# Patient Record
Sex: Male | Born: 1950 | Race: White | Hispanic: No | Marital: Married | Smoking: Never smoker
Health system: Southern US, Community
[De-identification: ages and names within clinical notes are randomized; demographics above are authoritative.]

## PROBLEM LIST (undated history)

## (undated) DIAGNOSIS — K635 Polyp of colon: Secondary | ICD-10-CM

## (undated) DIAGNOSIS — K219 Gastro-esophageal reflux disease without esophagitis: Secondary | ICD-10-CM

## (undated) DIAGNOSIS — I4891 Unspecified atrial fibrillation: Secondary | ICD-10-CM

## (undated) DIAGNOSIS — R42 Dizziness and giddiness: Secondary | ICD-10-CM

## (undated) DIAGNOSIS — I10 Essential (primary) hypertension: Secondary | ICD-10-CM

## (undated) DIAGNOSIS — F329 Major depressive disorder, single episode, unspecified: Secondary | ICD-10-CM

## (undated) DIAGNOSIS — M109 Gout, unspecified: Secondary | ICD-10-CM

## (undated) DIAGNOSIS — F1020 Alcohol dependence, uncomplicated: Secondary | ICD-10-CM

## (undated) DIAGNOSIS — K922 Gastrointestinal hemorrhage, unspecified: Secondary | ICD-10-CM

## (undated) DIAGNOSIS — F32A Depression, unspecified: Secondary | ICD-10-CM

## (undated) DIAGNOSIS — F1010 Alcohol abuse, uncomplicated: Secondary | ICD-10-CM

## (undated) DIAGNOSIS — F419 Anxiety disorder, unspecified: Secondary | ICD-10-CM

## (undated) HISTORY — DX: Gastro-esophageal reflux disease without esophagitis: K21.9

## (undated) HISTORY — PX: TONSILLECTOMY: SUR1361

## (undated) HISTORY — PX: ADENOIDECTOMY: SUR15

## (undated) HISTORY — PX: HERNIA REPAIR: SHX51

---

## 2013-01-19 ENCOUNTER — Encounter (HOSPITAL_COMMUNITY): Payer: Self-pay

## 2013-01-19 ENCOUNTER — Emergency Department (HOSPITAL_COMMUNITY): Payer: Self-pay

## 2013-01-19 ENCOUNTER — Emergency Department (HOSPITAL_COMMUNITY)
Admission: EM | Admit: 2013-01-19 | Discharge: 2013-01-20 | Disposition: A | Discharge status: Home / Self Care | Payer: Self-pay | Attending: Emergency Medicine | Admitting: Emergency Medicine

## 2013-01-19 DIAGNOSIS — F10929 Alcohol use, unspecified with intoxication, unspecified: Secondary | ICD-10-CM

## 2013-01-19 DIAGNOSIS — Z79899 Other long term (current) drug therapy: Secondary | ICD-10-CM | POA: Insufficient documentation

## 2013-01-19 DIAGNOSIS — Y9389 Activity, other specified: Secondary | ICD-10-CM | POA: Insufficient documentation

## 2013-01-19 DIAGNOSIS — I10 Essential (primary) hypertension: Secondary | ICD-10-CM | POA: Insufficient documentation

## 2013-01-19 DIAGNOSIS — J3489 Other specified disorders of nose and nasal sinuses: Secondary | ICD-10-CM | POA: Insufficient documentation

## 2013-01-19 DIAGNOSIS — M109 Gout, unspecified: Secondary | ICD-10-CM | POA: Insufficient documentation

## 2013-01-19 DIAGNOSIS — F1010 Alcohol abuse, uncomplicated: Secondary | ICD-10-CM | POA: Insufficient documentation

## 2013-01-19 DIAGNOSIS — R42 Dizziness and giddiness: Secondary | ICD-10-CM | POA: Insufficient documentation

## 2013-01-19 DIAGNOSIS — Z043 Encounter for examination and observation following other accident: Secondary | ICD-10-CM | POA: Insufficient documentation

## 2013-01-19 DIAGNOSIS — Y92410 Unspecified street and highway as the place of occurrence of the external cause: Secondary | ICD-10-CM | POA: Insufficient documentation

## 2013-01-19 HISTORY — DX: Gout, unspecified: M10.9

## 2013-01-19 HISTORY — DX: Essential (primary) hypertension: I10

## 2013-01-19 LAB — BASIC METABOLIC PANEL
CO2: 23 mEq/L (ref 19–32)
Chloride: 101 mEq/L (ref 96–112)
GFR calc Af Amer: >90 mL/min (ref >90)
Potassium: 3.5 mEq/L (ref 3.5–5.1)
Sodium: 137 mEq/L (ref 135–145)

## 2013-01-19 LAB — CBC WITH DIFFERENTIAL/PLATELET
Basophils Absolute: 0 10*3/uL (ref 0.0–0.1)
Basophils Relative: 0 % (ref 0–1)
HCT: 36.6 % — ABNORMAL LOW (ref 39.0–52.0)
Hemoglobin: 12.6 g/dL — ABNORMAL LOW (ref 13.0–17.0)
Lymphocytes Relative: 39 % (ref 12–46)
MCHC: 34.4 g/dL (ref 30.0–36.0)
Neutro Abs: 2.3 10*3/uL (ref 1.7–7.7)
Neutrophils Relative %: 48 % (ref 43–77)
RDW: 14.6 % (ref 11.5–15.5)
WBC: 5 10*3/uL (ref 4.0–10.5)

## 2013-01-19 LAB — ETHANOL: Alcohol, Ethyl (B): 101 mg/dL — ABNORMAL HIGH (ref 0–11)

## 2013-01-19 MED ORDER — IOHEXOL 300 MG/ML  SOLN
100.0000 mL | Freq: Once | INTRAMUSCULAR | Status: AC | PRN
  Administered 2013-01-19: 100 mL via INTRAVENOUS

## 2013-01-19 MED ORDER — ONDANSETRON HCL 4 MG/2ML IJ SOLN
4.0000 mg | Freq: Once | INTRAMUSCULAR | Status: AC
  Administered 2013-01-19: 4 mg via INTRAVENOUS
  Filled 2013-01-19: qty 2

## 2013-01-19 MED ORDER — SODIUM CHLORIDE 0.9 % IV SOLN
INTRAVENOUS | Status: DC
  Administered 2013-01-19: 23:00:00 via INTRAVENOUS

## 2013-01-19 MED ORDER — SODIUM CHLORIDE 0.9 % IV BOLUS (SEPSIS)
250.0000 mL | Freq: Once | INTRAVENOUS | Status: AC
  Administered 2013-01-19: 250 mL via INTRAVENOUS
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
## 2013-01-19 NOTE — ED Notes (Signed)
Remains alert, denies pain anywhere.
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
## 2013-02-01 ENCOUNTER — Emergency Department (HOSPITAL_COMMUNITY)
Admission: EM | Admit: 2013-02-01 | Discharge: 2013-02-01 | Discharge status: Against Medical Advice | Payer: No Typology Code available for payment source | Attending: Emergency Medicine | Admitting: Emergency Medicine

## 2013-02-01 DIAGNOSIS — Z5320 Procedure and treatment not carried out because of patient's decision for unspecified reasons: Secondary | ICD-10-CM | POA: Insufficient documentation

## 2013-02-01 NOTE — ED Notes (Signed)
Pt undecided about being seen in Er.  He will wait in lobby and decide.

## 2013-02-02 ENCOUNTER — Encounter (HOSPITAL_COMMUNITY): Payer: Self-pay | Admitting: *Deleted

## 2013-02-02 ENCOUNTER — Emergency Department (HOSPITAL_COMMUNITY)
Admission: EM | Admit: 2013-02-02 | Discharge: 2013-02-02 | Disposition: A | Discharge status: Home / Self Care | Payer: No Typology Code available for payment source | Attending: Emergency Medicine | Admitting: Emergency Medicine

## 2013-02-02 DIAGNOSIS — Y9389 Activity, other specified: Secondary | ICD-10-CM | POA: Insufficient documentation

## 2013-02-02 DIAGNOSIS — I10 Essential (primary) hypertension: Secondary | ICD-10-CM | POA: Insufficient documentation

## 2013-02-02 DIAGNOSIS — M109 Gout, unspecified: Secondary | ICD-10-CM | POA: Insufficient documentation

## 2013-02-02 DIAGNOSIS — S298XXA Other specified injuries of thorax, initial encounter: Secondary | ICD-10-CM | POA: Insufficient documentation

## 2013-02-02 DIAGNOSIS — R0789 Other chest pain: Secondary | ICD-10-CM

## 2013-02-02 DIAGNOSIS — Y92410 Unspecified street and highway as the place of occurrence of the external cause: Secondary | ICD-10-CM | POA: Insufficient documentation

## 2013-02-02 DIAGNOSIS — Z79899 Other long term (current) drug therapy: Secondary | ICD-10-CM | POA: Insufficient documentation

## 2013-02-02 MED ORDER — IBUPROFEN 600 MG PO TABS: 600.0000 mg | ORAL_TABLET | Freq: Four times a day (QID) | ORAL | Status: DC | PRN

## 2013-02-02 MED ORDER — METHOCARBAMOL 500 MG PO TABS: 500.0000 mg | ORAL_TABLET | Freq: Two times a day (BID) | ORAL | Status: DC

## 2013-02-02 MED ORDER — KETOROLAC TROMETHAMINE 60 MG/2ML IM SOLN
60.0000 mg | Freq: Once | INTRAMUSCULAR | Status: AC
  Administered 2013-02-02: 60 mg via INTRAMUSCULAR
  Filled 2013-02-02: qty 2
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
## 2013-02-05 ENCOUNTER — Encounter (HOSPITAL_COMMUNITY): Payer: Self-pay | Admitting: Emergency Medicine

## 2013-02-05 ENCOUNTER — Emergency Department (HOSPITAL_COMMUNITY)
Admission: EM | Admit: 2013-02-05 | Discharge: 2013-02-05 | Disposition: A | Discharge status: Home / Self Care | Payer: Self-pay | Attending: Emergency Medicine | Admitting: Emergency Medicine

## 2013-02-05 DIAGNOSIS — F419 Anxiety disorder, unspecified: Secondary | ICD-10-CM

## 2013-02-05 DIAGNOSIS — M109 Gout, unspecified: Secondary | ICD-10-CM | POA: Insufficient documentation

## 2013-02-05 DIAGNOSIS — F341 Dysthymic disorder: Secondary | ICD-10-CM | POA: Insufficient documentation

## 2013-02-05 DIAGNOSIS — Z79899 Other long term (current) drug therapy: Secondary | ICD-10-CM | POA: Insufficient documentation

## 2013-02-05 DIAGNOSIS — I10 Essential (primary) hypertension: Secondary | ICD-10-CM | POA: Insufficient documentation

## 2013-02-05 HISTORY — DX: Dizziness and giddiness: R42
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
## 2013-02-06 ENCOUNTER — Emergency Department (HOSPITAL_COMMUNITY)
Admission: EM | Admit: 2013-02-06 | Discharge: 2013-02-06 | Disposition: A | Discharge status: Home / Self Care | Payer: Self-pay | Attending: Emergency Medicine | Admitting: Emergency Medicine

## 2013-02-06 ENCOUNTER — Encounter (HOSPITAL_COMMUNITY): Payer: Self-pay | Admitting: Emergency Medicine

## 2013-02-06 DIAGNOSIS — M109 Gout, unspecified: Secondary | ICD-10-CM | POA: Insufficient documentation

## 2013-02-06 DIAGNOSIS — F419 Anxiety disorder, unspecified: Secondary | ICD-10-CM

## 2013-02-06 DIAGNOSIS — Z79899 Other long term (current) drug therapy: Secondary | ICD-10-CM | POA: Insufficient documentation

## 2013-02-06 DIAGNOSIS — F411 Generalized anxiety disorder: Secondary | ICD-10-CM | POA: Insufficient documentation

## 2013-02-06 DIAGNOSIS — I10 Essential (primary) hypertension: Secondary | ICD-10-CM | POA: Insufficient documentation

## 2013-02-06 DIAGNOSIS — F329 Major depressive disorder, single episode, unspecified: Secondary | ICD-10-CM | POA: Insufficient documentation

## 2013-02-06 DIAGNOSIS — F3289 Other specified depressive episodes: Secondary | ICD-10-CM | POA: Insufficient documentation

## 2013-02-06 HISTORY — DX: Depression, unspecified: F32.A

## 2013-02-06 HISTORY — DX: Major depressive disorder, single episode, unspecified: F32.9

## 2013-02-06 HISTORY — DX: Anxiety disorder, unspecified: F41.9

## 2013-02-06 NOTE — ED Notes (Signed)
Pt is wanting to know how long before he talks with DR. Misty Stanley

## 2013-02-06 NOTE — ED Notes (Signed)
Pt requesting to leave the er, states "I don;t have time to wait", Dr. Adriana Simas informed, advised pt is able to leave, sign pt out ama, attempts made to talk to pt by several different staff, pt still requesting to leave.
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
## 2013-02-06 NOTE — ED Notes (Signed)
Security paged to triage, patient wanded per protocol.

## 2013-02-06 NOTE — ED Notes (Signed)
Pt presents to er with request to have appointment made with psych. Pt states that he needs help, has been having mental problems all his life, admits to hearing voices at times, denies any SI/HI, Dr. Adriana Simas in prior to RN, see EDP assessment for further. Pt anxious about staying in er, states "I don't have time to stay in er, I don't see why I can't make an appointment for the er" explained to pt that appointments are not made in the er, comfort measure provided.

## 2013-02-06 NOTE — ED Notes (Signed)
States that he is having "mental issues."  States that he is here for psychotherapy.  States that he is hearing voices of his dead relatives.  Denies suicidal thoughts, denies homicidal thoughts.  States that he just feels alone.

## 2013-02-20 ENCOUNTER — Emergency Department (HOSPITAL_COMMUNITY)
Admission: EM | Admit: 2013-02-20 | Discharge: 2013-02-21 | Disposition: A | Discharge status: Home / Self Care | Payer: Self-pay | Attending: Emergency Medicine | Admitting: Emergency Medicine

## 2013-02-20 ENCOUNTER — Encounter (HOSPITAL_COMMUNITY): Payer: Self-pay

## 2013-02-20 ENCOUNTER — Emergency Department (HOSPITAL_COMMUNITY): Payer: Self-pay

## 2013-02-20 DIAGNOSIS — Z8639 Personal history of other endocrine, nutritional and metabolic disease: Secondary | ICD-10-CM | POA: Insufficient documentation

## 2013-02-20 DIAGNOSIS — F3289 Other specified depressive episodes: Secondary | ICD-10-CM | POA: Insufficient documentation

## 2013-02-20 DIAGNOSIS — F10229 Alcohol dependence with intoxication, unspecified: Secondary | ICD-10-CM | POA: Insufficient documentation

## 2013-02-20 DIAGNOSIS — Z79899 Other long term (current) drug therapy: Secondary | ICD-10-CM | POA: Insufficient documentation

## 2013-02-20 DIAGNOSIS — F411 Generalized anxiety disorder: Secondary | ICD-10-CM | POA: Insufficient documentation

## 2013-02-20 DIAGNOSIS — F329 Major depressive disorder, single episode, unspecified: Secondary | ICD-10-CM | POA: Insufficient documentation

## 2013-02-20 DIAGNOSIS — F1020 Alcohol dependence, uncomplicated: Secondary | ICD-10-CM

## 2013-02-20 DIAGNOSIS — I10 Essential (primary) hypertension: Secondary | ICD-10-CM | POA: Insufficient documentation

## 2013-02-20 DIAGNOSIS — F10929 Alcohol use, unspecified with intoxication, unspecified: Secondary | ICD-10-CM

## 2013-02-20 DIAGNOSIS — Z862 Personal history of diseases of the blood and blood-forming organs and certain disorders involving the immune mechanism: Secondary | ICD-10-CM | POA: Insufficient documentation

## 2013-02-20 LAB — URINALYSIS, ROUTINE W REFLEX MICROSCOPIC
Bilirubin Urine: NEGATIVE
Protein, ur: NEGATIVE mg/dL
Specific Gravity, Urine: 1.025 (ref 1.005–1.030)
Urobilinogen, UA: 0.2 mg/dL (ref 0.0–1.0)

## 2013-02-20 LAB — BASIC METABOLIC PANEL
BUN: 11 mg/dL (ref 6–23)
CO2: 20 mEq/L (ref 19–32)
Chloride: 94 mEq/L — ABNORMAL LOW (ref 96–112)
Creatinine, Ser: 0.94 mg/dL (ref 0.50–1.35)
Glucose, Bld: 139 mg/dL — ABNORMAL HIGH (ref 70–99)
Potassium: 3.1 mEq/L — ABNORMAL LOW (ref 3.5–5.1)

## 2013-02-20 LAB — CBC WITH DIFFERENTIAL/PLATELET
HCT: 41.7 % (ref 39.0–52.0)
Hemoglobin: 14.7 g/dL (ref 13.0–17.0)
Lymphs Abs: 2.6 10*3/uL (ref 0.7–4.0)
MCHC: 35.3 g/dL (ref 30.0–36.0)
Monocytes Absolute: 0.5 10*3/uL (ref 0.1–1.0)
Monocytes Relative: 9 % (ref 3–12)
Neutro Abs: 2.7 10*3/uL (ref 1.7–7.7)
Neutrophils Relative %: 45 % (ref 43–77)
RBC: 4.7 MIL/uL (ref 4.22–5.81)

## 2013-02-20 LAB — RAPID URINE DRUG SCREEN, HOSP PERFORMED
Amphetamines: NOT DETECTED
Barbiturates: NOT DETECTED
Benzodiazepines: POSITIVE — AB
Cocaine: NOT DETECTED
Opiates: NOT DETECTED
Tetrahydrocannabinol: NOT DETECTED

## 2013-02-20 LAB — URINE MICROSCOPIC-ADD ON

## 2013-02-20 MED ORDER — ACETAMINOPHEN 325 MG PO TABS: 650.0000 mg | ORAL_TABLET | ORAL | Status: DC | PRN

## 2013-02-20 MED ORDER — ADULT MULTIVITAMIN W/MINERALS CH
1.0000 | ORAL_TABLET | Freq: Every day | ORAL | Status: DC
  Administered 2013-02-21: 1 via ORAL
  Filled 2013-02-20: qty 1

## 2013-02-20 MED ORDER — IBUPROFEN 400 MG PO TABS: 600.0000 mg | ORAL_TABLET | Freq: Three times a day (TID) | ORAL | Status: DC | PRN

## 2013-02-20 MED ORDER — FOLIC ACID 1 MG PO TABS
1.0000 mg | ORAL_TABLET | Freq: Every day | ORAL | Status: DC
  Administered 2013-02-21: 1 mg via ORAL
  Filled 2013-02-20: qty 1

## 2013-02-20 MED ORDER — ONDANSETRON HCL 4 MG PO TABS
4.0000 mg | ORAL_TABLET | Freq: Three times a day (TID) | ORAL | Status: DC | PRN
  Administered 2013-02-21: 4 mg via ORAL
  Filled 2013-02-20: qty 1

## 2013-02-20 MED ORDER — ALUM & MAG HYDROXIDE-SIMETH 200-200-20 MG/5ML PO SUSP: 30.0000 mL | ORAL | Status: DC | PRN

## 2013-02-20 MED ORDER — VITAMIN B-1 100 MG PO TABS
100.0000 mg | ORAL_TABLET | Freq: Every day | ORAL | Status: DC
  Administered 2013-02-21: 100 mg via ORAL
  Filled 2013-02-20: qty 1

## 2013-02-20 MED ORDER — LORAZEPAM 2 MG/ML IJ SOLN: 1.0000 mg | Freq: Four times a day (QID) | INTRAMUSCULAR | Status: DC | PRN

## 2013-02-20 MED ORDER — THIAMINE HCL 100 MG/ML IJ SOLN: 100.0000 mg | Freq: Every day | INTRAMUSCULAR | Status: DC

## 2013-02-20 MED ORDER — ZOLPIDEM TARTRATE 5 MG PO TABS: 5.0000 mg | ORAL_TABLET | Freq: Every evening | ORAL | Status: DC | PRN

## 2013-02-20 MED ORDER — LORAZEPAM 2 MG/ML IJ SOLN
2.0000 mg | Freq: Once | INTRAMUSCULAR | Status: AC
  Administered 2013-02-21: 2 mg via INTRAVENOUS
  Filled 2013-02-20: qty 1

## 2013-02-20 MED ORDER — NICOTINE 21 MG/24HR TD PT24: 21.0000 mg | MEDICATED_PATCH | Freq: Every day | TRANSDERMAL | Status: DC | PRN

## 2013-02-20 MED ORDER — LORAZEPAM 1 MG PO TABS
1.0000 mg | ORAL_TABLET | Freq: Four times a day (QID) | ORAL | Status: DC | PRN
  Administered 2013-02-21 (×3): 1 mg via ORAL
  Filled 2013-02-20 (×3): qty 1

## 2013-02-20 NOTE — ED Notes (Signed)
Joseph Art, NT and trained sitter going with patient to xray due to suicidal precautions.
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
## 2013-02-20 NOTE — ED Notes (Signed)
When this nurse asked patient about suicidal ideations, patient replied, "No, but now that you mention it, it's not a bad idea." Sitter has patient in sight. Patient undressed and placed on suicidal precautions.
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
## 2013-02-21 NOTE — ED Provider Notes (Signed)
PT STABLE VITALS IMPROVED NO SIGNS OF SIGNIFICANT WITHDRAWAL AT THIS TIME, WALKING AROUND THE ED IN NO DISTRESS UNABLE TO GET PLACED AT THIS TIME BUT IS STABLE FOR D/C PT AGREEABLE  Joya Gaskins, MD 02/21/13 1423

## 2013-02-21 NOTE — ED Notes (Signed)
Misty Stanley from registration diligently working to find [employer], no results as of yet.

## 2013-02-21 NOTE — ED Notes (Signed)
Spoke with Tammy Sours from ACT team, [facility] may have a bed depending on Pt's insurance, there are no sponsorship beds available.  Working with registration to find out more information on patients insurance.

## 2013-02-21 NOTE — ED Notes (Signed)
Discharge instructions reviewed with pt, questions answered. Pt verbalized understanding. Valuables returned by security, personal belongings returned and verified with pt.

## 2013-02-21 NOTE — ED Notes (Signed)
Pt resting in bed, even rise and fall of chest, in NAD. Sitter remains at bedside. Will continue to monitor.

## 2013-02-21 NOTE — ED Notes (Signed)
Pt resting in bed, even rise and fall of chest, in NAD. Sitter remains at bedside. Will continue to monitor. 

## 2013-02-21 NOTE — ED Notes (Signed)
Pt resting in bed, in no apparent distress.

## 2013-02-21 NOTE — BH Assessment (Signed)
[facility] Assessment Progress Note      02/21/13.  Pt referrals made to [facility] and [facility].  [facility] and [facility] both full. Daleen Squibb, LCSWA

## 2013-03-10 ENCOUNTER — Encounter (HOSPITAL_COMMUNITY): Payer: Self-pay | Admitting: Emergency Medicine

## 2013-03-10 ENCOUNTER — Emergency Department (HOSPITAL_COMMUNITY)
Admission: EM | Admit: 2013-03-10 | Discharge: 2013-03-10 | Disposition: A | Discharge status: Home / Self Care | Payer: Self-pay | Attending: Emergency Medicine | Admitting: Emergency Medicine

## 2013-03-10 DIAGNOSIS — F411 Generalized anxiety disorder: Secondary | ICD-10-CM | POA: Insufficient documentation

## 2013-03-10 DIAGNOSIS — F3289 Other specified depressive episodes: Secondary | ICD-10-CM | POA: Insufficient documentation

## 2013-03-10 DIAGNOSIS — M109 Gout, unspecified: Secondary | ICD-10-CM | POA: Insufficient documentation

## 2013-03-10 DIAGNOSIS — I10 Essential (primary) hypertension: Secondary | ICD-10-CM | POA: Insufficient documentation

## 2013-03-10 DIAGNOSIS — F329 Major depressive disorder, single episode, unspecified: Secondary | ICD-10-CM | POA: Insufficient documentation

## 2013-03-10 DIAGNOSIS — Z79899 Other long term (current) drug therapy: Secondary | ICD-10-CM | POA: Insufficient documentation

## 2013-03-10 MED ORDER — LISINOPRIL 10 MG PO TABS
20.0000 mg | ORAL_TABLET | Freq: Once | ORAL | Status: AC
  Administered 2013-03-10: 20 mg via ORAL
  Filled 2013-03-10: qty 2

## 2013-03-10 MED ORDER — TRAZODONE HCL 50 MG PO TABS
50.0000 mg | ORAL_TABLET | Freq: Once | ORAL | Status: AC
  Administered 2013-03-10: 50 mg via ORAL
  Filled 2013-03-10: qty 1

## 2013-03-10 MED ORDER — TRAZODONE HCL 50 MG PO TABS
ORAL_TABLET | ORAL | Status: AC
  Filled 2013-03-10: qty 1

## 2013-03-10 MED ORDER — LISINOPRIL 20 MG PO TABS: 20.0000 mg | ORAL_TABLET | Freq: Every day | ORAL | Status: DC

## 2013-03-10 NOTE — ED Provider Notes (Signed)
History     CSN: [id number]  Arrival date & time 03/10/13  1650   First MD Initiated Contact with Patient 03/10/13 1707      No chief complaint on file.   (Consider location/radiation/quality/duration/timing/severity/associated sxs/prior treatment) Patient is a 62 y.o. male presenting with hypertension. The history is provided by the patient (the pt wants treatment for bp so he can go to detox).  Hypertension This is a new problem. The current episode started more than 2 days ago. The problem occurs constantly. The problem has not changed since onset.Pertinent negatives include no chest pain, no abdominal pain and no headaches. Nothing aggravates the symptoms. Nothing relieves the symptoms.    Past Medical History  Diagnosis Date  . Gout   . Hypertension   . Vertigo   . Depression   . Anxiety     Past Surgical History  Procedure Laterality Date  . Hernia repair      No family history on file.  History  Substance Use Topics  . Smoking status: Never Smoker   . Smokeless tobacco: Not on file  . Alcohol Use: 0.0 oz/week     Comment: occasional/ in AA      Review of Systems  Constitutional: Negative for appetite change and fatigue.  HENT: Negative for congestion, sinus pressure and ear discharge.   Eyes: Negative for discharge.  Respiratory: Negative for cough.   Cardiovascular: Negative for chest pain.  Gastrointestinal: Negative for abdominal pain and diarrhea.  Genitourinary: Negative for frequency and hematuria.  Musculoskeletal: Negative for back pain.  Skin: Negative for rash.  Neurological: Negative for seizures and headaches.  Psychiatric/Behavioral: Negative for hallucinations.    Allergies  Review of patient's allergies indicates no known allergies.  Home Medications   Current Outpatient Rx  Name  Route  Sig  Dispense  Refill  . sertraline (ZOLOFT) 50 MG tablet   Oral   Take 50 mg by mouth daily.         . simvastatin (ZOCOR) 20 MG tablet  Oral   Take 20 mg by mouth daily.         . indomethacin (INDOCIN) 50 MG capsule   Oral   Take 50 mg by mouth daily as needed (for gout flare).          Marland Kitchen lisinopril (PRINIVIL,ZESTRIL) 20 MG tablet   Oral   Take 1 tablet (20 mg total) by mouth daily.   30 tablet   0     BP 163/117  Pulse 81  Resp 12  SpO2 100%  Physical Exam  Constitutional: He is oriented to person, place, and time. He appears well-developed.  HENT:  Head: Normocephalic.  Eyes: Conjunctivae and EOM are normal. No scleral icterus.  Neck: Neck supple. No thyromegaly present.  Cardiovascular: Normal rate and regular rhythm.  Exam reveals no gallop and no friction rub.   No murmur heard. Pulmonary/Chest: No stridor. He has no wheezes. He has no rales. He exhibits no tenderness.  Abdominal: He exhibits no distension. There is no tenderness. There is no rebound.  Musculoskeletal: Normal range of motion. He exhibits no edema.  Lymphadenopathy:    He has no cervical adenopathy.  Neurological: He is oriented to person, place, and time. Coordination normal.  Skin: No rash noted. No erythema.  Psychiatric: He has a normal mood and affect. His behavior is normal.    ED Course  Procedures (including critical care time)  Labs Reviewed - No data to display No results found.  1. Hypertension       MDM          Benny Lennert, MD 03/10/13 1840

## 2013-03-10 NOTE — ED Notes (Signed)
Pt states he completed 5 day detox program at [facility] on Monday but feels he needs longer 3 week program. Pt states he went to [facility] this morning but was sent to ED for medical approval due elevated blood pressure. Denies si/hi.

## 2013-03-12 ENCOUNTER — Emergency Department (HOSPITAL_COMMUNITY)
Admission: EM | Admit: 2013-03-12 | Discharge: 2013-03-12 | Disposition: A | Discharge status: Home / Self Care | Payer: Self-pay | Attending: Emergency Medicine | Admitting: Emergency Medicine

## 2013-03-12 ENCOUNTER — Encounter (HOSPITAL_COMMUNITY): Payer: Self-pay | Admitting: Emergency Medicine

## 2013-03-12 DIAGNOSIS — Z79899 Other long term (current) drug therapy: Secondary | ICD-10-CM | POA: Insufficient documentation

## 2013-03-12 DIAGNOSIS — R6883 Chills (without fever): Secondary | ICD-10-CM | POA: Insufficient documentation

## 2013-03-12 DIAGNOSIS — F329 Major depressive disorder, single episode, unspecified: Secondary | ICD-10-CM | POA: Insufficient documentation

## 2013-03-12 DIAGNOSIS — M109 Gout, unspecified: Secondary | ICD-10-CM | POA: Insufficient documentation

## 2013-03-12 DIAGNOSIS — F1010 Alcohol abuse, uncomplicated: Secondary | ICD-10-CM | POA: Insufficient documentation

## 2013-03-12 DIAGNOSIS — F411 Generalized anxiety disorder: Secondary | ICD-10-CM | POA: Insufficient documentation

## 2013-03-12 DIAGNOSIS — R443 Hallucinations, unspecified: Secondary | ICD-10-CM | POA: Insufficient documentation

## 2013-03-12 DIAGNOSIS — I10 Essential (primary) hypertension: Secondary | ICD-10-CM | POA: Insufficient documentation

## 2013-03-12 DIAGNOSIS — F3289 Other specified depressive episodes: Secondary | ICD-10-CM | POA: Insufficient documentation

## 2013-03-12 LAB — RAPID URINE DRUG SCREEN, HOSP PERFORMED
Barbiturates: NOT DETECTED
Benzodiazepines: POSITIVE — AB
Cocaine: NOT DETECTED
Tetrahydrocannabinol: NOT DETECTED

## 2013-03-12 LAB — BASIC METABOLIC PANEL
BUN: 13 mg/dL (ref 6–23)
CO2: 24 mEq/L (ref 19–32)
Glucose, Bld: 108 mg/dL — ABNORMAL HIGH (ref 70–99)
Potassium: 3.7 mEq/L (ref 3.5–5.1)
Sodium: 141 mEq/L (ref 135–145)

## 2013-03-12 LAB — CBC
HCT: 41.7 % (ref 39.0–52.0)
Hemoglobin: 14.5 g/dL (ref 13.0–17.0)
MCH: 31.6 pg (ref 26.0–34.0)
RBC: 4.59 MIL/uL (ref 4.22–5.81)

## 2013-03-12 LAB — ETHANOL: Alcohol, Ethyl (B): <11 mg/dL (ref 0–11)

## 2013-03-12 MED ORDER — CLONIDINE HCL 0.1 MG PO TABS
0.1000 mg | ORAL_TABLET | Freq: Once | ORAL | Status: AC
  Administered 2013-03-12: 0.1 mg via ORAL
  Filled 2013-03-12: qty 1
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
## 2013-03-12 NOTE — ED Notes (Addendum)
Pt presents upon referral of [facility] secondary to withdrawal and to continue his rehab process. Pt states has generalized joint pain and is lethargic. Pt reports last time he was at the ED he was prescribed a non- narcotic pain medication that helped with his pain and anxiety. Pt states has been on seboxin x 15 yrs, and has recently weaned from it. Pt would like to return to [facility] to finish treatment. NAD noted. Pt not placed in paper scrubs per ACT team. Pt denies SI/HI.

## 2013-03-12 NOTE — ED Notes (Addendum)
Pt here for referral to [facility] for inpatient rehab from etoh and suboxone. Denies SI/HI.  Pt  seen in ED Wednesday for same.

## 2013-03-12 NOTE — ED Provider Notes (Signed)
History    This chart was scribed for Miguel Lennert, MD by Toya Smothers, ED Scribe. The patient was seen in room APA17/APA17. Patient's care was started at 1415.  CSN: [id number]  Arrival date & time 03/12/13  1415   First MD Initiated Contact with Patient 03/12/13 1703      Chief Complaint  Patient presents with  . Medical Clearance   Patient is a 62 y.o. male presenting with drug problem. The history is provided by the patient. No language interpreter was used.  Drug Problem This is a recurrent problem. The problem occurs constantly. The problem has been gradually worsening. Pertinent negatives include no chest pain. Nothing aggravates the symptoms. Nothing relieves the symptoms. The treatment provided no relief.    HPI Comments: Miguel Spencer is a 62 y.o. male who presents to the Emergency Department for medical clearance. 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 He reports one prior admission was for SI, but he denies SI currently. Pt denies  headache, diaphoresis, fever, chills, nausea, vomiting, diarrhea, weakness, cough, SOB and any other pain. Current medications include Zoloft 50 mg, Zorco 20 mg, Indomethacin 50 mg, and Indocin 50 mg. Pt denies use of tobacco, alcohol, and illicit drug use. Pt denotes no Hx of admission for rehabilitation. Medical Hx includes HTN, depression, and anxiety.    Past Medical History  Diagnosis Date  . Gout   . Hypertension   . Vertigo   . Depression   . Anxiety     Past Surgical History  Procedure Laterality Date  . Hernia repair      No family history on file.  History  Substance Use Topics  . Smoking status: Never Smoker   . Smokeless tobacco: Not on file  . Alcohol Use: 0.0 oz/week     Comment: occasional/ in AA       Review of Systems  Constitutional: Positive for chills. Negative for appetite change and fatigue.  HENT: Negative for congestion, sinus pressure and ear discharge.   Eyes: Negative for discharge.  Respiratory: Negative for cough.   Cardiovascular: Negative for chest pain.  Gastrointestinal: Negative for diarrhea.  Genitourinary: Negative for frequency and hematuria.  Musculoskeletal: Negative for back pain.  Skin: Negative for rash.  Psychiatric/Behavioral: Positive for dysphoric mood. Negative for self-injury.    Allergies  Review of patient's allergies indicates no known allergies.  Home Medications   Current Outpatient Rx  Name  Route  Sig  Dispense  Refill  . indomethacin (INDOCIN) 50 MG capsule   Oral   Take 50 mg by mouth daily as needed (for gout flare).          . simvastatin (ZOCOR) 20 MG tablet   Oral   Take 20 mg by mouth daily.         Marland Kitchen lisinopril (PRINIVIL,ZESTRIL) 20 MG tablet   Oral   Take 1 tablet (20 mg total) by mouth daily.   30 tablet   0   . sertraline (ZOLOFT) 50 MG tablet   Oral   Take 50 mg by mouth daily.           BP 158/115  Pulse 107  Temp(Src) 97.7 F (36.5 C) (Oral)  Resp 20  Ht 6' (1.829 m)  Wt 223 lb (101.152 kg)  BMI  30.24 kg/m2  SpO2 99%  Physical Exam  Nursing note and vitals reviewed. Constitutional: He is oriented to person, place, and time. He appears well-developed.  HENT:  Head: Normocephalic.  Eyes: Conjunctivae and EOM are normal. No scleral icterus.  Neck: Neck supple. No thyromegaly present.  Cardiovascular: Normal rate and regular rhythm.  Exam reveals no gallop and no friction rub.   No murmur heard. Pulmonary/Chest: No stridor. He has no wheezes. He has no rales. He exhibits no tenderness.  Abdominal: He exhibits no distension. There is no tenderness. There is no rebound.  Musculoskeletal: Normal range of motion. He exhibits no edema.  Lymphadenopathy:    He has no cervical adenopathy.   Neurological: He is oriented to person, place, and time. Coordination normal.  Skin: No rash noted. No erythema.  Psychiatric: He has a normal mood and affect. His behavior is normal.  Mildly depressed    ED Course  Procedures DIAGNOSTIC STUDIES: Oxygen Saturation is 99% on room air, normal by my interpretation.    COORDINATION OF CARE: 17:18- Evaluated Pt. Pt is awake, alert, and without distress. 17:22- Patient understands and agrees with initial ED impression and plan with expectations set for ED visit.     Labs Reviewed  CBC - Abnormal; Notable for the following:    RDW 15.6 (*)    All other components within normal limits  BASIC METABOLIC PANEL - Abnormal; Notable for the following:    Glucose, Bld 108 (*)    GFR calc non Af Amer 90 (*)    All other components within normal limits  ETHANOL  URINE RAPID DRUG SCREEN (HOSP PERFORMED)   No results found.   No diagnosis found.    MDM     Pt seen by act and will follow up monday   The chart was scribed for me under my direct supervision.  I personally performed the history, physical, and medical decision making and all procedures in the evaluation of this patient.Miguel Lennert, MD 03/12/13 [phone]

## 2013-03-12 NOTE — BH Assessment (Signed)
[facility] Assessment Progress Note      Patient was given information on how to contact [facility] on Monday to seek admission for rehabilitation. He verbalized understanding. He signed a Engineer, manufacturing systems, which included the instructions on how to contact [facility].   Shon Baton, MSW, LCSW, LCASA, CSW-G

## 2014-06-09 ENCOUNTER — Ambulatory Visit (INDEPENDENT_AMBULATORY_CARE_PROVIDER_SITE_OTHER): Payer: Self-pay | Admitting: Otolaryngology

## 2014-06-30 ENCOUNTER — Ambulatory Visit (INDEPENDENT_AMBULATORY_CARE_PROVIDER_SITE_OTHER): Payer: BC Managed Care – PPO | Admitting: Otolaryngology

## 2014-06-30 DIAGNOSIS — H9209 Otalgia, unspecified ear: Secondary | ICD-10-CM

## 2014-06-30 DIAGNOSIS — H6120 Impacted cerumen, unspecified ear: Secondary | ICD-10-CM

## 2014-12-22 ENCOUNTER — Ambulatory Visit (INDEPENDENT_AMBULATORY_CARE_PROVIDER_SITE_OTHER): Payer: Self-pay | Admitting: Otolaryngology

## 2015-01-19 ENCOUNTER — Ambulatory Visit (INDEPENDENT_AMBULATORY_CARE_PROVIDER_SITE_OTHER): Payer: Self-pay | Admitting: Otolaryngology

## 2015-02-09 ENCOUNTER — Ambulatory Visit (INDEPENDENT_AMBULATORY_CARE_PROVIDER_SITE_OTHER): Payer: 59 | Admitting: Otolaryngology

## 2015-02-09 DIAGNOSIS — H6123 Impacted cerumen, bilateral: Secondary | ICD-10-CM | POA: Diagnosis not present

## 2015-10-12 ENCOUNTER — Ambulatory Visit (INDEPENDENT_AMBULATORY_CARE_PROVIDER_SITE_OTHER): Payer: 59 | Admitting: Otolaryngology

## 2018-07-07 HISTORY — PX: COLONOSCOPY: SHX174

## 2018-08-13 ENCOUNTER — Encounter (HOSPITAL_COMMUNITY): Payer: Self-pay | Admitting: Emergency Medicine

## 2018-08-13 ENCOUNTER — Emergency Department (HOSPITAL_COMMUNITY)

## 2018-08-13 ENCOUNTER — Other Ambulatory Visit: Payer: Self-pay

## 2018-08-13 ENCOUNTER — Inpatient Hospital Stay (HOSPITAL_COMMUNITY)
Admission: EM | Admit: 2018-08-13 | Discharge: 2018-08-15 | DRG: 309 | Attending: Internal Medicine | Admitting: Internal Medicine

## 2018-08-13 DIAGNOSIS — F419 Anxiety disorder, unspecified: Secondary | ICD-10-CM | POA: Diagnosis present

## 2018-08-13 DIAGNOSIS — I4891 Unspecified atrial fibrillation: Secondary | ICD-10-CM

## 2018-08-13 DIAGNOSIS — I248 Other forms of acute ischemic heart disease: Secondary | ICD-10-CM | POA: Diagnosis present

## 2018-08-13 DIAGNOSIS — K279 Peptic ulcer, site unspecified, unspecified as acute or chronic, without hemorrhage or perforation: Secondary | ICD-10-CM | POA: Diagnosis present

## 2018-08-13 DIAGNOSIS — F10239 Alcohol dependence with withdrawal, unspecified: Secondary | ICD-10-CM

## 2018-08-13 DIAGNOSIS — R000 Tachycardia, unspecified: Secondary | ICD-10-CM | POA: Diagnosis present

## 2018-08-13 DIAGNOSIS — F329 Major depressive disorder, single episode, unspecified: Secondary | ICD-10-CM | POA: Diagnosis present

## 2018-08-13 DIAGNOSIS — M109 Gout, unspecified: Secondary | ICD-10-CM | POA: Diagnosis present

## 2018-08-13 DIAGNOSIS — R748 Abnormal levels of other serum enzymes: Secondary | ICD-10-CM | POA: Diagnosis present

## 2018-08-13 DIAGNOSIS — D649 Anemia, unspecified: Secondary | ICD-10-CM | POA: Diagnosis present

## 2018-08-13 DIAGNOSIS — F1010 Alcohol abuse, uncomplicated: Secondary | ICD-10-CM | POA: Diagnosis present

## 2018-08-13 DIAGNOSIS — D500 Iron deficiency anemia secondary to blood loss (chronic): Secondary | ICD-10-CM | POA: Diagnosis present

## 2018-08-13 DIAGNOSIS — Z79899 Other long term (current) drug therapy: Secondary | ICD-10-CM | POA: Diagnosis not present

## 2018-08-13 DIAGNOSIS — I10 Essential (primary) hypertension: Secondary | ICD-10-CM | POA: Diagnosis present

## 2018-08-13 DIAGNOSIS — I340 Nonrheumatic mitral (valve) insufficiency: Secondary | ICD-10-CM | POA: Diagnosis not present

## 2018-08-13 DIAGNOSIS — R9431 Abnormal electrocardiogram [ECG] [EKG]: Secondary | ICD-10-CM | POA: Diagnosis present

## 2018-08-13 DIAGNOSIS — F32A Depression, unspecified: Secondary | ICD-10-CM | POA: Diagnosis present

## 2018-08-13 DIAGNOSIS — I361 Nonrheumatic tricuspid (valve) insufficiency: Secondary | ICD-10-CM | POA: Diagnosis not present

## 2018-08-13 DIAGNOSIS — F10939 Alcohol use, unspecified with withdrawal, unspecified: Secondary | ICD-10-CM

## 2018-08-13 HISTORY — DX: Alcohol abuse, uncomplicated: F10.10

## 2018-08-13 HISTORY — DX: Unspecified atrial fibrillation: I48.91

## 2018-08-13 LAB — BASIC METABOLIC PANEL
Anion gap: 9 (ref 5–15)
BUN: 10 mg/dL (ref 8–23)
CHLORIDE: 106 mmol/L (ref 98–111)
CO2: 22 mmol/L (ref 22–32)
CREATININE: 0.82 mg/dL (ref 0.61–1.24)
Calcium: 8.9 mg/dL (ref 8.9–10.3)
GFR calc Af Amer: >60 mL/min (ref >60)
GFR calc non Af Amer: >60 mL/min (ref >60)
GLUCOSE: 105 mg/dL — AB (ref 70–99)
Potassium: 4 mmol/L (ref 3.5–5.1)
Sodium: 137 mmol/L (ref 135–145)

## 2018-08-13 LAB — HEPATIC FUNCTION PANEL
ALBUMIN: 3.6 g/dL (ref 3.5–5.0)
ALK PHOS: 70 U/L (ref 38–126)
ALT: 34 U/L (ref 0–44)
AST: 34 U/L (ref 15–41)
BILIRUBIN INDIRECT: 0.4 mg/dL (ref 0.3–0.9)
Bilirubin, Direct: 0.1 mg/dL (ref 0.0–0.2)
TOTAL PROTEIN: 6.6 g/dL (ref 6.5–8.1)
Total Bilirubin: 0.5 mg/dL (ref 0.3–1.2)

## 2018-08-13 LAB — LIPASE, BLOOD: LIPASE: 71 U/L — AB (ref 11–51)

## 2018-08-13 LAB — ETHANOL: Alcohol, Ethyl (B): <10 mg/dL (ref <10)

## 2018-08-13 LAB — MAGNESIUM: Magnesium: 1.8 mg/dL (ref 1.7–2.4)

## 2018-08-13 LAB — CBC
HEMATOCRIT: 37.2 % — AB (ref 39.0–52.0)
Hemoglobin: 12.2 g/dL — ABNORMAL LOW (ref 13.0–17.0)
MCH: 31 pg (ref 26.0–34.0)
MCHC: 32.8 g/dL (ref 30.0–36.0)
MCV: 94.7 fL (ref 80.0–100.0)
Platelets: 354 10*3/uL (ref 150–400)
RBC: 3.93 MIL/uL — AB (ref 4.22–5.81)
RDW: 15.3 % (ref 11.5–15.5)
WBC: 7.3 10*3/uL (ref 4.0–10.5)
nRBC: 0 % (ref 0.0–0.2)

## 2018-08-13 LAB — PHOSPHORUS: Phosphorus: 3.6 mg/dL (ref 2.5–4.6)

## 2018-08-13 LAB — TROPONIN I
Troponin I: <0.03 ng/mL (ref <0.03)
Troponin I: <0.03 ng/mL (ref <0.03)

## 2018-08-13 MED ORDER — ACETAMINOPHEN 325 MG PO TABS
650.0000 mg | ORAL_TABLET | Freq: Four times a day (QID) | ORAL | Status: DC | PRN
  Administered 2018-08-14 – 2018-08-15 (×2): 650 mg via ORAL
  Filled 2018-08-13 (×2): qty 2

## 2018-08-13 MED ORDER — METOPROLOL TARTRATE 5 MG/5ML IV SOLN
5.0000 mg | Freq: Once | INTRAVENOUS | Status: AC
  Administered 2018-08-13: 5 mg via INTRAVENOUS
  Filled 2018-08-13: qty 5

## 2018-08-13 MED ORDER — LABETALOL HCL 5 MG/ML IV SOLN
10.0000 mg | Freq: Once | INTRAVENOUS | Status: AC
  Administered 2018-08-13: 10 mg via INTRAVENOUS
  Filled 2018-08-13: qty 4

## 2018-08-13 MED ORDER — DILTIAZEM HCL-DEXTROSE 100-5 MG/100ML-% IV SOLN (PREMIX)
5.0000 mg/h | INTRAVENOUS | Status: DC
  Administered 2018-08-13 – 2018-08-14 (×2): 5 mg/h via INTRAVENOUS
  Filled 2018-08-13 (×2): qty 100

## 2018-08-13 MED ORDER — LORAZEPAM 2 MG/ML IJ SOLN
2.0000 mg | INTRAMUSCULAR | Status: DC | PRN
  Administered 2018-08-13 – 2018-08-15 (×3): 2 mg via INTRAVENOUS
  Filled 2018-08-13 (×3): qty 1

## 2018-08-13 MED ORDER — LORAZEPAM 2 MG/ML IJ SOLN
1.0000 mg | Freq: Once | INTRAMUSCULAR | Status: AC
  Administered 2018-08-13: 1 mg via INTRAVENOUS
  Filled 2018-08-13: qty 1

## 2018-08-13 MED ORDER — DILTIAZEM LOAD VIA INFUSION
20.0000 mg | Freq: Once | INTRAVENOUS | Status: AC
  Administered 2018-08-13: 20 mg via INTRAVENOUS
  Filled 2018-08-13: qty 20

## 2018-08-13 MED ORDER — ACETAMINOPHEN 650 MG RE SUPP: 650.0000 mg | Freq: Four times a day (QID) | RECTAL | Status: DC | PRN

## 2018-08-13 MED ORDER — METOPROLOL SUCCINATE ER 50 MG PO TB24
100.0000 mg | ORAL_TABLET | Freq: Every day | ORAL | Status: DC
  Administered 2018-08-14 – 2018-08-15 (×2): 100 mg via ORAL
  Filled 2018-08-13 (×2): qty 2

## 2018-08-13 MED ORDER — SUCRALFATE 1 G PO TABS
2.0000 g | ORAL_TABLET | Freq: Two times a day (BID) | ORAL | Status: DC
  Administered 2018-08-13 – 2018-08-15 (×4): 2 g via ORAL
  Filled 2018-08-13 (×4): qty 2

## 2018-08-13 MED ORDER — DILTIAZEM HCL ER COATED BEADS 180 MG PO CP24
180.0000 mg | ORAL_CAPSULE | Freq: Every day | ORAL | Status: DC
  Administered 2018-08-14 – 2018-08-15 (×2): 180 mg via ORAL
  Filled 2018-08-13 (×2): qty 1

## 2018-08-13 MED ORDER — MAGNESIUM SULFATE 4 GM/100ML IV SOLN
4.0000 g | Freq: Once | INTRAVENOUS | Status: AC
  Administered 2018-08-13: 4 g via INTRAVENOUS
  Filled 2018-08-13: qty 100

## 2018-08-13 MED ORDER — FAMOTIDINE 20 MG PO TABS
40.0000 mg | ORAL_TABLET | Freq: Two times a day (BID) | ORAL | Status: DC
  Administered 2018-08-13 – 2018-08-15 (×4): 40 mg via ORAL
  Filled 2018-08-13 (×4): qty 2

## 2018-08-13 MED ORDER — PROCHLORPERAZINE EDISYLATE 10 MG/2ML IJ SOLN: 10.0000 mg | Freq: Four times a day (QID) | INTRAMUSCULAR | Status: DC | PRN

## 2018-08-13 NOTE — ED Provider Notes (Signed)
[facility] EMERGENCY DEPARTMENT Provider Note   CSN: [id number] Arrival date & time: 08/13/18  1602     History   Chief Complaint Chief Complaint  Patient presents with  . Palpitations    HPI Miguel Spencer is a 67 y.o. male.  Pt presents to the ED today with a.fib.  The pt was arrested yesterday and has not had his meds (cardizem, metoprolol, protonix, and carafate) in jail.  He said he was recently admitted in [place], [place] for blood pressure problems.  The pt c/o palpitations today.  He is not on blood thinners.  Records from [facility] in [place] show pt was admitted from 10/20-28 for SI, GIB, alcohol withdrawal.  He has had rectal bleeding for 2 months.  He had been on coumadin which was discontinued.  EGD and colonoscopy showed gastritis and polyps.  The polyp was sent to pathology.  No results yet.  He was started on protonix and pepcid.  Pt tried to hang himself with a necktie prior to arrival in [place].  His SI resolved.  Pt was put on librium while in the hospital.  The pt was put back on diltiazem, metoprolol, pepcid, and carafate.  CHA2DS2/VAS Stroke Risk Points    2 (1 for age and 1 for htn)       Past Medical History:  Diagnosis Date  . A-fib ([place])   . Anxiety   . Depression   . Gout   . Hypertension   . Vertigo     Patient Active Problem List   Diagnosis Date Noted  . Atrial fibrillation with RVR ([place]) 08/13/2018    Past Surgical History:  Procedure Laterality Date  . COLONOSCOPY    . HERNIA REPAIR          Home Medications    Prior to Admission medications   Medication Sig Start Date End Date Taking? Authorizing Provider  indomethacin (INDOCIN) 50 MG capsule Take 50 mg by mouth daily as needed (for gout flare).     [provider]  lisinopril (PRINIVIL,ZESTRIL) 20 MG tablet Take 1 tablet (20 mg total) by mouth daily. 03/10/13   Milton Ferguson, MD  sertraline (ZOLOFT) 50 MG tablet Take 50 mg by mouth daily.     [provider]  simvastatin (ZOCOR) 20 MG tablet Take 20 mg by mouth daily.    [provider]    Family History Family History  Problem Relation Age of Onset  . Cancer Mother   . Heart disease Father   . Heart disease Brother   . Heart disease Other     Social History Social History   Tobacco Use  . Smoking status: Never Smoker  . Smokeless tobacco: Never Used  Substance Use Topics  . Alcohol use: Yes    Comment: occasional/ in AA  . Drug use: No     Allergies   Patient has no known allergies.   Review of Systems Review of Systems  Respiratory: Positive for shortness of breath.   Cardiovascular: Positive for palpitations.  All other systems reviewed and are negative.    Physical Exam Updated Vital Signs BP (!) 155/110   Pulse 75   Resp 15   Ht 5\' 11"  (1.803 m)   Wt 88.9 kg   SpO2 100%   BMI 27.34 kg/m   Physical Exam  Constitutional: He is oriented to person, place, and time. He appears well-developed and well-nourished.  HENT:  Head: Normocephalic and atraumatic.  Right  Ear: External ear normal.  Left Ear: External ear normal.  Nose: Nose normal.  Mouth/Throat: Oropharynx is clear and moist.  Eyes: Pupils are equal, round, and reactive to light. Conjunctivae and EOM are normal.  Neck: Normal range of motion. Neck supple.  Cardiovascular: An irregularly irregular rhythm present. Tachycardia present.  Pulmonary/Chest: Effort normal and breath sounds normal.  Abdominal: Soft. Bowel sounds are normal.  Musculoskeletal: Normal range of motion.  Neurological: He is alert and oriented to person, place, and time.  Skin: Skin is warm. Capillary refill takes less than 2 seconds.  Psychiatric: He has a normal mood and affect. His behavior is normal. Judgment and thought content normal.  Nursing note and vitals reviewed.    ED Treatments / Results  Labs (all labs ordered are listed, but only abnormal results are displayed) Labs  Reviewed  BASIC METABOLIC PANEL - Abnormal; Notable for the following components:      Result Value   Glucose, Bld 105 (*)    All other components within normal limits  CBC - Abnormal; Notable for the following components:   RBC 3.93 (*)    Hemoglobin 12.2 (*)    HCT 37.2 (*)    All other components within normal limits  LIPASE, BLOOD - Abnormal; Notable for the following components:   Lipase 71 (*)    All other components within normal limits  ETHANOL  TROPONIN I  MAGNESIUM  PHOSPHORUS  HEPATIC FUNCTION PANEL  TROPONIN I  TROPONIN I    EKG EKG Interpretation  Date/Time:  Thursday August 13 2018 18:08:10 EST Ventricular Rate:  82 PR Interval:    QRS Duration: 86 QT Interval:  371 QTC Calculation: 434 R Axis:   6 Text Interpretation:  Atrial fibrillation Ventricular premature complex Since last tracing rate slower Confirmed by Isla Pence [phone]) on 08/13/2018 6:26:27 PM   Radiology Dg Chest 2 View  Result Date: 08/13/2018 CLINICAL DATA:  intermittent Afib. Pain between shoulders, sob-ongoing x a year, productive cough-coughing up blood x 1 week. states he was recently found to have a mass during colonoscopy. Patient states he has history of urinary and rectal bleeding. History of HTN. EXAM: CHEST - 2 VIEW COMPARISON:  02/20/2013 FINDINGS: Cardiac silhouette normal in size. No mediastinal or hilar masses. No evidence of adenopathy. Clear lungs.  No pleural effusion or pneumothorax. Skeletal structures are intact. IMPRESSION: No active cardiopulmonary disease. Electronically Signed   By: Lajean Manes M.D.   On: 08/13/2018 17:14    Procedures Procedures (including critical care time)  Medications Ordered in ED Medications  diltiazem (CARDIZEM) 1 mg/mL load via infusion 20 mg (20 mg Intravenous Bolus from Bag 08/13/18 1651)    And  diltiazem (CARDIZEM) 100 mg in dextrose 5% 141mL (1 mg/mL) infusion (5 mg/hr Intravenous Rate/Dose Change 08/13/18 1937)  metoprolol  tartrate (LOPRESSOR) injection 5 mg (5 mg Intravenous Given 08/13/18 1829)  LORazepam (ATIVAN) injection 1 mg (1 mg Intravenous Given 08/13/18 1845)  labetalol (NORMODYNE,TRANDATE) injection 10 mg (10 mg Intravenous Given 08/13/18 1932)     Initial Impression / Assessment and Plan / ED Course  I have reviewed the triage vital signs and the nursing notes.  Pertinent labs & imaging results that were available during my care of the patient were reviewed by me and considered in my medical decision making (see chart for details).    CRITICAL CARE Performed by: Isla Pence   Total critical care time: 30 minutes  Critical care time was exclusive of separately billable  procedures and treating other patients.  Critical care was necessary to treat or prevent imminent or life-threatening deterioration.  Critical care was time spent personally by me on the following activities: development of treatment plan with patient and/or surrogate as well as nursing, discussions with consultants, evaluation of patient's response to treatment, examination of patient, obtaining history from patient or surrogate, ordering and performing treatments and interventions, ordering and review of laboratory studies, ordering and review of radiographic studies, pulse oximetry and re-evaluation of patient's condition.  Pt's HR still not under control with 10 mcg cardizem and with 5 mg of lopressor and 10 mg labetalol.  Pt is not on his anticoagulation and he has chronic afib, so he can't be cardioverted.  He is unable to be anticoagulated due to the gastritis and rectal bleeding.  Pt d/w Dr. Olevia Bowens ([facility]) who will admit.   Final Clinical Impressions(s) / ED Diagnoses   Final diagnoses:  Atrial fibrillation with rapid ventricular response [facility])    ED Discharge Orders    None       Isla Pence, MD 08/13/18 1939

## 2018-08-13 NOTE — ED Notes (Signed)
Pt ambulated to BR on monitor.

## 2018-08-13 NOTE — ED Notes (Signed)
Pt no longer being transferred will be admitted  [facility].

## 2018-08-13 NOTE — H&P (Signed)
History and Physical    MAHLON GABRIELLE PJA:[id number] DOB: 1951-09-02 DOA: 08/13/2018  PCP: Patient, No Pcp Per   Patient coming from: [facility] detention [facility].  I have personally briefly reviewed patient's old medical records in [place]  Chief Complaint: Atrial fibrillation.  HPI: Miguel Spencer is a 67 y.o. male with medical history significant of atrial fibrillation, alcohol abuse, anxiety, depression, gout, hypertension, vertigo who is brought to the emergency department by the [facility] detention center due to atrial fibrillation.  The patient has not taken his metoprolol or Cardizem for the past 2 days.  He also drinks about 10-12 beers daily and has now had alcohol in the past 2 days as well, since he has being in the detention center.  Per patient and records from [facility] from [place], [place] showed that the patient was admitted there for rectal bleeding, PUD, suicidal ideations and alcohol withdrawal.  However, records are limited.  His anticoagulation with warfarin was discontinued due to PUD and rectal bleed.  A colonoscopy and EGD were performed.  He had a lesion in her right skull lung that was sent to pathology, but results are not back yet.  Besides metoprolol and diltiazem, he was also prescribed famotidine and sucralfate.  He was picked up by his aunt at the time of discharge and came to [place].  He denies chest pain, diaphoresis, PND or orthopnea, but complains of palpitations and dyspnea.  He denies fever, chills, sore throat, headache, wheezing, hemoptysis, abdominal pain, nausea, emesis, melena or hematochezia.  His bowel habit alternates between diarrhea and constipation.  He denies dysuria, frequency or hematuria.  ED Course: Initial temperature 98.2 F, pulse 75, respirations 18, blood pressure 130/97 mmHg and O2 sat 97% on room air.  The patient was started on a Cardizem infusion after receiving a 20 mg Cardizem bolus around 1645.  He was also  given 5 mg of metoprolol tartrate about an hour and a half after the diltiazem infusion was started.  He was given 1 mg of Ativan at 1845.  He was given labetalol 10 mg IVP at 1930.  CBC showed a white count of 7.3, hemoglobin 12.2 and platelets 354.  Lipase was elevated at 71 units/L.  Troponin was normal.  LFTs were normal.  Magnesium was 1.8 and phosphorus 3.6 mg/dL.  His chest radiograph was within normal limits.  Review of Systems: As per HPI otherwise 10 point review of systems negative.   Past Medical History:  Diagnosis Date  . A-fib ([place])   . Alcohol abuse   . Anxiety   . Depression   . Gout   . Hypertension   . Vertigo     Past Surgical History:  Procedure Laterality Date  . COLONOSCOPY    . HERNIA REPAIR       reports that he has never smoked. He has never used smokeless tobacco. He reports that he drinks alcohol. He reports that he does not use drugs.  No Known Allergies  Family History  Problem Relation Age of Onset  . Cancer Mother   . Heart disease Father   . Heart disease Brother   . Heart disease Other    Prior to Admission medications   Medication Sig Start Date End Date Taking? Authorizing Provider  diltiazem (DILACOR XR) 180 MG 24 hr capsule Take 180 mg by mouth daily.   Yes [provider]  famotidine (PEPCID) 40 MG tablet Take 40 mg by mouth 2 (two)  times daily.   Yes [provider]  indomethacin (INDOCIN) 50 MG capsule Take 50 mg by mouth daily as needed for mild pain or moderate pain.   Yes [provider]  metoprolol succinate (TOPROL-XL) 100 MG 24 hr tablet Take 100 mg by mouth daily. Take with or immediately following a meal.   Yes [provider]  sucralfate (CARAFATE) 1 g tablet Take 2 g by mouth 2 (two) times daily.   Yes [provider]    Physical Exam: Vitals:   08/13/18 2000 08/13/18 2015 08/13/18 2030 08/13/18 2045  BP: (!) 114/93 (!) 123/92 114/87 (!) 117/94  Pulse: 76 (!) 105 93 81    Resp: 20 20 19 20   SpO2: 98% 99% 97% 100%  Weight:      Height:        Constitutional: NAD, calm, comfortable Eyes: PERRL, lids and conjunctivae normal ENMT: Mucous membranes are moist. Posterior pharynx clear of any exudate or lesions. Neck: normal, supple, no masses, no thyromegaly Respiratory: clear to auscultation bilaterally, no wheezing, no crackles. Normal respiratory effort. No accessory muscle use.  Cardiovascular: Irregularly irregular, no murmurs / rubs / gallops. No extremity edema. 2+ pedal pulses. No carotid bruits.  Abdomen: Soft, no tenderness, no masses palpated. No hepatosplenomegaly. Bowel sounds positive.  Musculoskeletal: no clubbing / cyanosis.  Good ROM, no contractures. Normal muscle tone.  Handcuffs on LUE and LLE. Skin: no rashes, lesions, ulcers. No induration limited dermatological examination.  Neurologic: Positive resting tremors and tongue fasciculations.  CN 2-12 grossly intact. Sensation intact, DTR normal. Strength 5/5 in all 4.  Psychiatric: Normal judgment and insight. Alert and oriented x 3.  Mildly anxious mood.   Labs on Admission: I have personally reviewed following labs and imaging studies  CBC: Recent Labs  Lab 08/13/18 1640  WBC 7.3  HGB 12.2*  HCT 37.2*  MCV 94.7  PLT 818   Basic Metabolic Panel: Recent Labs  Lab 08/13/18 1640  NA 137  K 4.0  CL 106  CO2 22  GLUCOSE 105*  BUN 10  CREATININE 0.82  CALCIUM 8.9  MG 1.8  PHOS 3.6   GFR: Estimated Creatinine Clearance: 94.4 mL/min (by C-G formula based on SCr of 0.82 mg/dL). Liver Function Tests: Recent Labs  Lab 08/13/18 1640  AST 34  ALT 34  ALKPHOS 70  BILITOT 0.5  PROT 6.6  ALBUMIN 3.6   Recent Labs  Lab 08/13/18 1640  LIPASE 71*   No results for input(s): AMMONIA in the last 168 hours. Coagulation Profile: No results for input(s): INR, PROTIME in the last 168 hours. Cardiac Enzymes: Recent Labs  Lab 08/13/18 1640  TROPONINI <0.03   BNP (last 3  results) No results for input(s): PROBNP in the last 8760 hours. HbA1C: No results for input(s): HGBA1C in the last 72 hours. CBG: No results for input(s): GLUCAP in the last 168 hours. Lipid Profile: No results for input(s): CHOL, HDL, LDLCALC, TRIG, CHOLHDL, LDLDIRECT in the last 72 hours. Thyroid Function Tests: No results for input(s): TSH, T4TOTAL, FREET4, T3FREE, THYROIDAB in the last 72 hours. Anemia Panel: No results for input(s): VITAMINB12, FOLATE, FERRITIN, TIBC, IRON, RETICCTPCT in the last 72 hours. Urine analysis:    Component Value Date/Time   COLORURINE YELLOW 02/20/2013 2236   APPEARANCEUR CLEAR 02/20/2013 2236   LABSPEC 1.025 02/20/2013 2236   PHURINE 6.0 02/20/2013 2236   GLUCOSEU NEGATIVE 02/20/2013 2236   HGBUR MODERATE (A) 02/20/2013 2236   BILIRUBINUR NEGATIVE 02/20/2013 2236  KETONESUR TRACE (A) 02/20/2013 2236   PROTEINUR NEGATIVE 02/20/2013 2236   UROBILINOGEN 0.2 02/20/2013 2236   NITRITE NEGATIVE 02/20/2013 2236   LEUKOCYTESUR NEGATIVE 02/20/2013 2236    Radiological Exams on Admission: Dg Chest 2 View  Result Date: 08/13/2018 CLINICAL DATA:  intermittent Afib. Pain between shoulders, sob-ongoing x a year, productive cough-coughing up blood x 1 week. states he was recently found to have a mass during colonoscopy. Patient states he has history of urinary and rectal bleeding. History of HTN. EXAM: CHEST - 2 VIEW COMPARISON:  02/20/2013 FINDINGS: Cardiac silhouette normal in size. No mediastinal or hilar masses. No evidence of adenopathy. Clear lungs.  No pleural effusion or pneumothorax. Skeletal structures are intact. IMPRESSION: No active cardiopulmonary disease. Electronically Signed   By: Lajean Manes M.D.   On: 08/13/2018 17:14    EKG: Independently reviewed.  Vent. rate 118 BPM PR interval * ms QRS duration 82 ms QT/QTc 336/470 ms P-R-T axes * -2 23 Atrial fibrillation with rapid ventricular response with premature ventricular or aberrantly  conducted complexes Abnormal ECG  Vent. rate 111 BPM PR interval * ms QRS duration 86 ms QT/QTc 369/502 ms P-R-T axes * -2 29 Atrial fibrillation Prolonged QT interval  Assessment/Plan Principal Problem:   Atrial fibrillation with RVR (HCC) Admit to stepdown/inpatient. Continue supplemental oxygen. Continue Cardizem infusion. Resume Cardizem in the morning. Resume oral metoprolol in a.m. Keep magnesium over 2.0 mg/dL and potassium over 4.0 mmol/L.  Active Problems:   Alcohol withdrawal (HCC)   Alcohol abuse CIWA protocol. MVI, folate and thiamine supplementation. Consider social services and/or [facility] consult.    Depression   Anxiety Denied suicidal thoughts. No longer on sertraline. Consider [facility] consult.    Hypertension Resume metoprolol succinate 100 mg p.o. daily. Resume Cardizem CD 180 mg p.o. daily. Monitor blood pressure and heart rate.    Gout No symptoms at this time. Hold as needed indometacin given history of PUD.    Anemia Likely due to recent GI blood loss. Monitor hematocrit and hemoglobin.    Elevated lipase Likely due to chronic alcohol use. No abdominal pain at this time. Repeat level measurement as needed.    Prolonged QT interval Magnesium has been supplemented. Follow-up EKG.   DVT prophylaxis: SCDs. Code Status: Full code. Family Communication: Disposition Plan: Admit for rate control with Cardizem infusion. Consults called: Admission status: Inpatient/stepdown.   Reubin Milan MD [facility] Pager [phone].  If 7PM-7AM, please contact night-coverage [email or web address] Password [email or web address]  08/13/2018, 9:21 PM

## 2018-08-13 NOTE — ED Triage Notes (Signed)
Patient reports intermittent Afib, states he was recently found to have a mass during colonoscopy. Patient states he has history of urinary and rectal bleeding.

## 2018-08-13 NOTE — ED Notes (Signed)
cardizem started at this time. Patient complains of nausea and says he does feel palpitations intermittently.

## 2018-08-13 NOTE — ED Notes (Signed)
Waiting for transfer to unit.

## 2018-08-14 ENCOUNTER — Other Ambulatory Visit: Payer: Self-pay

## 2018-08-14 ENCOUNTER — Inpatient Hospital Stay (HOSPITAL_COMMUNITY)

## 2018-08-14 ENCOUNTER — Other Ambulatory Visit (HOSPITAL_COMMUNITY): Payer: Self-pay | Admitting: *Deleted

## 2018-08-14 DIAGNOSIS — I10 Essential (primary) hypertension: Secondary | ICD-10-CM

## 2018-08-14 DIAGNOSIS — F1010 Alcohol abuse, uncomplicated: Secondary | ICD-10-CM

## 2018-08-14 DIAGNOSIS — R748 Abnormal levels of other serum enzymes: Secondary | ICD-10-CM

## 2018-08-14 DIAGNOSIS — I4891 Unspecified atrial fibrillation: Principal | ICD-10-CM

## 2018-08-14 DIAGNOSIS — I361 Nonrheumatic tricuspid (valve) insufficiency: Secondary | ICD-10-CM

## 2018-08-14 DIAGNOSIS — I340 Nonrheumatic mitral (valve) insufficiency: Secondary | ICD-10-CM

## 2018-08-14 LAB — CBC
HCT: 38.4 % — ABNORMAL LOW (ref 39.0–52.0)
HEMOGLOBIN: 12.4 g/dL — AB (ref 13.0–17.0)
MCH: 30.8 pg (ref 26.0–34.0)
MCHC: 32.3 g/dL (ref 30.0–36.0)
MCV: 95.5 fL (ref 80.0–100.0)
NRBC: 0 % (ref 0.0–0.2)
Platelets: 313 10*3/uL (ref 150–400)
RBC: 4.02 MIL/uL — AB (ref 4.22–5.81)
RDW: 15.5 % (ref 11.5–15.5)
WBC: 6.2 10*3/uL (ref 4.0–10.5)

## 2018-08-14 LAB — COMPREHENSIVE METABOLIC PANEL
ALT: 30 U/L (ref 0–44)
ANION GAP: 9 (ref 5–15)
AST: 28 U/L (ref 15–41)
Albumin: 3.6 g/dL (ref 3.5–5.0)
Alkaline Phosphatase: 70 U/L (ref 38–126)
BILIRUBIN TOTAL: 0.8 mg/dL (ref 0.3–1.2)
BUN: 8 mg/dL (ref 8–23)
CO2: 24 mmol/L (ref 22–32)
Calcium: 8.5 mg/dL — ABNORMAL LOW (ref 8.9–10.3)
Chloride: 100 mmol/L (ref 98–111)
Creatinine, Ser: 0.82 mg/dL (ref 0.61–1.24)
GFR calc Af Amer: >60 mL/min (ref >60)
Glucose, Bld: 114 mg/dL — ABNORMAL HIGH (ref 70–99)
Potassium: 4.3 mmol/L (ref 3.5–5.1)
Sodium: 133 mmol/L — ABNORMAL LOW (ref 135–145)
TOTAL PROTEIN: 7 g/dL (ref 6.5–8.1)

## 2018-08-14 LAB — TROPONIN I: TROPONIN I: 0.03 ng/mL — AB (ref <0.03)

## 2018-08-14 LAB — T4, FREE: Free T4: 0.78 ng/dL — ABNORMAL LOW (ref 0.82–1.77)

## 2018-08-14 LAB — ECHOCARDIOGRAM COMPLETE
Height: 71 in
WEIGHTICAEL: 3241.64 [oz_av]

## 2018-08-14 LAB — MRSA PCR SCREENING: MRSA by PCR: NEGATIVE

## 2018-08-14 LAB — MAGNESIUM: MAGNESIUM: 2.2 mg/dL (ref 1.7–2.4)

## 2018-08-14 LAB — TSH: TSH: 2.078 u[IU]/mL (ref 0.350–4.500)

## 2018-08-14 NOTE — Clinical Social Work Note (Signed)
Received CSW consult stating pt is from jail. RN CM can assist in communication with the jail RN if there are needs at dc. Will be available if any CSW needs arise.

## 2018-08-14 NOTE — Progress Notes (Signed)
Patient is in custody of [place] Sheriff's deputy who is in the  Room with the patient. The patient does have leg cuff on. The patient is in no obvious or stated distress and awakes easily from sleep.

## 2018-08-14 NOTE — Progress Notes (Signed)
  Echocardiogram 2D Echocardiogram has been performed.  Merrie Roof F 08/14/2018, 2:34 PM

## 2018-08-14 NOTE — Progress Notes (Signed)
PROGRESS NOTE  Miguel Spencer ZOX:[id number] DOB: [birth year] DOA: 08/13/2018 PCP: Patient, No Pcp Per  Brief History:  67 year old male with a history of atrial fibrillation, alcohol abuse, anxiety, gouty arthritis, hypertension, peptic ulcer disease presenting with palpitations.  Patient was recently admitted to [facility] in [place] from 07/26/2018 through 08/03/2018.  At that time, the patient was hospitalized for work-up of hematemesis and rectal bleeding.  He underwent EGD and colonoscopy.  Records were limited at the time of my evaluation.  Apparently, the patient had a rectal polyp, pathology was pending.  In addition, the patient had gastritis.  He was placed on Pepcid and Carafate.  He was also placed back on diltiazem CD 180 mg daily and metoprolol succinate 100 mg daily.  During the hospitalization, the patient also had alcohol withdrawal for which she was treated with Librium.  In addition, the patient has suicidal ideation which had resolved.  He has since moved to [place] to be with aunt and uncle.  Apparently, the patient was arrested by [facility] police on 81/10/9145.  He has been without any of his medications since the morning of 08/12/2018.  Upon arrival to the emergency department, the patient was noted to have atrial fibrillation with RVR.  He was started on diltiazem drip admitted for further evaluation.  The patient states that he has not had any further rectal bleeding or hematemesis.  He has some shortness of breath but denies any chest pain, nausea, vomiting, diarrhea, fevers, chills, headache.  The patient states that his last alcohol drink was 5 to 6 days prior to this admission.  Previously, the patient normally drinks 10-12 beers on a daily basis for over the last 30 to 40 years.  Assessment/Plan: Atrial fibrillation with RVR, type unspecified -Wean off diltiazem drip -Restart diltiazem CD 180 mg daily -Restart metoprolol succinate 100 mg  daily -Echocardiogram -Personally reviewed EKG--atrial fibrillation, nonspecific T wave change  Essential hypertension -Resume diltiazem CD and metoprolol succinate  Alcohol abuse -Alcohol withdrawal protocol -hep B surface antigen -hep c antibody  Peptic ulcer disease -Continue Carafate and pepcid  Depression/anxiety -Denies homicidal and suicidal ideation  Elevated lipase -Nonspecific -LFTs unremarkable -No abdominal pain presently  Elevated troponin -due to demand ischemia from tachycardia -no signs of ACS    Disposition Plan:   [facility] if HR controlled Family Communication:  No Family at bedside  Consultants:  none  Code Status:  FULL   DVT Prophylaxis:  SCDs   Procedures: As Listed in Progress Note Above  Antibiotics: None      Subjective: Patient complains of some generalized fatigue malaise.  He denies any chest pain, nausea, vomiting, diarrhea, abdominal pain, dysuria, hematuria.  He feels hungry.  He has some shortness of breath but seems to be improving.  He denies any headache or neck pain.  Objective: Vitals:   08/14/18 0400 08/14/18 0500 08/14/18 0600 08/14/18 0840  BP: (!) 136/110 (!) 148/114 (!) 113/93   Pulse: (!) 104 (!) 106 99 (!) 106  Resp: (!) 23 (!) 26 (!) 21 20  Temp: 97.9 F (36.6 C)   99.6 F (37.6 C)  TempSrc: Oral   Oral  SpO2: 97% 94% 95% 95%  Weight:  91.9 kg    Height:        Intake/Output Summary (Last 24 hours) at 08/14/2018 0858 Last data filed at 08/14/2018 0600 Gross per 24 hour  Intake 95.12 ml  Output 550 ml  Net -454.88 ml   Weight change:  Exam:   General:  Pt is alert, follows commands appropriately, not in acute distress  HEENT: No icterus, No thrush, No neck mass, [place]/AT  Cardiovascular: IRRR, S1/S2, no rubs, no gallops  Respiratory: Fine bibasilar crackles but no wheezing.  Good air movement.  Abdomen: Soft/+BS, non tender, non distended, no guarding  Extremities: trace LE  edema, No lymphangitis, No petechiae, No rashes, no synovitis   Data Reviewed: I have personally reviewed following labs and imaging studies Basic Metabolic Panel: Recent Labs  Lab 08/13/18 1640  NA 137  K 4.0  CL 106  CO2 22  GLUCOSE 105*  BUN 10  CREATININE 0.82  CALCIUM 8.9  MG 1.8  PHOS 3.6   Liver Function Tests: Recent Labs  Lab 08/13/18 1640  AST 34  ALT 34  ALKPHOS 70  BILITOT 0.5  PROT 6.6  ALBUMIN 3.6   Recent Labs  Lab 08/13/18 1640  LIPASE 71*   No results for input(s): AMMONIA in the last 168 hours. Coagulation Profile: No results for input(s): INR, PROTIME in the last 168 hours. CBC: Recent Labs  Lab 08/13/18 1640  WBC 7.3  HGB 12.2*  HCT 37.2*  MCV 94.7  PLT 354   Cardiac Enzymes: Recent Labs  Lab 08/13/18 1640 08/13/18 2217 08/14/18 0432  TROPONINI <0.03 <0.03 0.03*   BNP: Invalid input(s): POCBNP CBG: No results for input(s): GLUCAP in the last 168 hours. HbA1C: No results for input(s): HGBA1C in the last 72 hours. Urine analysis:    Component Value Date/Time   COLORURINE YELLOW 02/20/2013 2236   APPEARANCEUR CLEAR 02/20/2013 2236   LABSPEC 1.025 02/20/2013 2236   PHURINE 6.0 02/20/2013 2236   GLUCOSEU NEGATIVE 02/20/2013 2236   HGBUR MODERATE (A) 02/20/2013 2236   BILIRUBINUR NEGATIVE 02/20/2013 2236   KETONESUR TRACE (A) 02/20/2013 2236   PROTEINUR NEGATIVE 02/20/2013 2236   UROBILINOGEN 0.2 02/20/2013 2236   NITRITE NEGATIVE 02/20/2013 2236   LEUKOCYTESUR NEGATIVE 02/20/2013 2236   Sepsis Labs: @LABRCNTIP (procalcitonin:4,lacticidven:4) ) Recent Results (from the past 240 hour(s))  MRSA PCR Screening     Status: None   Collection Time: 08/13/18 11:17 PM  Result Value Ref Range Status   MRSA by PCR NEGATIVE NEGATIVE Final    Comment:        The GeneXpert MRSA Assay (FDA approved for NASAL specimens only), is one component of a comprehensive MRSA colonization surveillance program. It is not intended to  diagnose MRSA infection nor to guide or monitor treatment for MRSA infections. Performed at [facility], [address], [place], [place] [address]      Scheduled Meds: . diltiazem  180 mg Oral Daily  . famotidine  40 mg Oral BID  . metoprolol succinate  100 mg Oral Daily  . sucralfate  2 g Oral BID   Continuous Infusions: . diltiazem (CARDIZEM) infusion 5 mg/hr (08/14/18 0600)    Procedures/Studies: Dg Chest 2 View  Result Date: 08/13/2018 CLINICAL DATA:  intermittent Afib. Pain between shoulders, sob-ongoing x a year, productive cough-coughing up blood x 1 week. states he was recently found to have a mass during colonoscopy. Patient states he has history of urinary and rectal bleeding. History of HTN. EXAM: CHEST - 2 VIEW COMPARISON:  02/20/2013 FINDINGS: Cardiac silhouette normal in size. No mediastinal or hilar masses. No evidence of adenopathy. Clear lungs.  No pleural effusion or pneumothorax. Skeletal structures are intact. IMPRESSION: No active cardiopulmonary disease. Electronically Signed  By: Lajean Manes M.D.   On: 08/13/2018 17:14    Orson Eva, DO  [facility] Pager [phone]  If 7PM-7AM, please contact night-coverage [email or web address] Password [email or web address] 08/14/2018, 8:58 AM   LOS: 1 day

## 2018-08-14 NOTE — Progress Notes (Signed)
CRITICAL VALUE ALERT  Critical Value:  Troponin 0.03 Date & Time Notied:  08/14/18 0530   Provider Notified: Olevia Bowens Orders Received/Actions taken:  No new orders at this time

## 2018-08-15 LAB — HEPATITIS C ANTIBODY

## 2018-08-15 LAB — HIV ANTIBODY (ROUTINE TESTING W REFLEX): HIV SCREEN 4TH GENERATION: NONREACTIVE

## 2018-08-15 LAB — HEPATITIS B SURFACE ANTIGEN: HEP B S AG: NEGATIVE

## 2018-08-15 MED ORDER — METOPROLOL SUCCINATE ER 100 MG PO TB24: 100.0000 mg | ORAL_TABLET | Freq: Every day | ORAL | 1 refills | Status: DC

## 2018-08-15 MED ORDER — DILTIAZEM HCL ER COATED BEADS 180 MG PO CP24: 180.0000 mg | ORAL_CAPSULE | Freq: Every day | ORAL | 1 refills | Status: DC

## 2018-08-15 MED ORDER — FAMOTIDINE 40 MG PO TABS: 40.0000 mg | ORAL_TABLET | Freq: Two times a day (BID) | ORAL | 1 refills | Status: DC

## 2018-08-15 MED ORDER — PREDNISONE 20 MG PO TABS: 50.0000 mg | ORAL_TABLET | Freq: Every day | ORAL | Status: DC

## 2018-08-15 MED ORDER — ASPIRIN EC 81 MG PO TBEC: 81.0000 mg | DELAYED_RELEASE_TABLET | Freq: Every day | ORAL | Status: DC

## 2018-08-15 MED ORDER — INDOMETHACIN 25 MG PO CAPS
50.0000 mg | ORAL_CAPSULE | Freq: Once | ORAL | Status: AC
  Administered 2018-08-15: 50 mg via ORAL
  Filled 2018-08-15: qty 2

## 2018-08-15 MED ORDER — ASPIRIN 81 MG PO TBEC: 81.0000 mg | DELAYED_RELEASE_TABLET | Freq: Every day | ORAL | 1 refills | Status: DC

## 2018-08-15 MED ORDER — PREDNISONE 50 MG PO TABS: 50.0000 mg | ORAL_TABLET | Freq: Every day | ORAL | 0 refills | Status: DC

## 2018-08-15 MED ORDER — SUCRALFATE 1 G PO TABS: 2.0000 g | ORAL_TABLET | Freq: Two times a day (BID) | ORAL | 1 refills | Status: DC

## 2018-08-15 NOTE — Discharge Summary (Signed)
Physician Discharge Summary  Miguel Spencer Xxxshelton DJM:[id number] DOB: November 28, 1950 DOA: 08/13/2018  PCP: Patient, No Pcp Per  Admit date: 08/13/2018 Discharge date: 08/15/2018  Admitted From: home Disposition:  Home   Recommendations for Outpatient Follow-up:  1. Follow up with PCP in 1-2 weeks 2. Please obtain BMP/CBC in one week     Discharge Condition: Stable CODE STATUS: FULL Diet recommendation: Heart Healthy   Brief/Interim Summary: 67 year old male with a history of atrial fibrillation, alcohol abuse, anxiety, gouty arthritis, hypertension, peptic ulcer disease presenting with palpitations.  Patient was recently admitted to [facility] in [place] from 07/26/2018 through 08/03/2018.  At that time, the patient was hospitalized for work-up of hematemesis and rectal bleeding.  He underwent EGD and colonoscopy.  Records were limited at the time of my evaluation.  Apparently, the patient had a rectal polyp, pathology was pending.  In addition, the patient had gastritis.  He was placed on Pepcid and Carafate.  He was also placed back on diltiazem CD 180 mg daily and metoprolol succinate 100 mg daily.  During the hospitalization, the patient also had alcohol withdrawal for which she was treated with Librium.  In addition, the patient has suicidal ideation which had resolved.  He has since moved to [place] to be with aunt and uncle.  Apparently, the patient was arrested by [facility] police on 22/11/9796.  He has been without any of his medications since the morning of 08/12/2018.  Upon arrival to the emergency department, the patient was noted to have atrial fibrillation with RVR.  He was started on diltiazem drip admitted for further evaluation.  The patient states that he has not had any further rectal bleeding or hematemesis.  He has some shortness of breath but denies any chest pain, nausea, vomiting, diarrhea, fevers, chills, headache.  The patient states that his last  alcohol drink was 5 to 6 days prior to this admission.  Previously, the patient normally drinks 10-12 beers on a daily basis for over the last 30 to 40 years.  The patient's home dose of diltiazem CD and metoprolol succinate were restarted.  His diltiazem drip was subsequently weaned off.  The patient's heart rate remained well controlled.  The patient was deemed to be a poor candidate for anticoagulation secondary to his alcohol abuse and recent GI bleed.  The patient was started on aspirin 81 mg daily.  During his hospitalization, the patient had a gouty arthritis flare.  He was given indomethacin x1.  The patient will be discharged with prednisone 50 mg daily for an additional 2 days.  Discharge Diagnoses:  Atrial fibrillation with RVR, type unspecified -Weaned off diltiazem drip -Restart diltiazem CD 180 mg daily -Restart metoprolol succinate 100 mg daily -Echocardiogram--EF 50%, diffuse HK, mild to moderate MR, mild TR, PASP 46. -Personally reviewed EKG--atrial fibrillation, nonspecific T wave change -Poor candidate for anticoagulation secondary to alcohol abuse and recent GI bleed -Start aspirin 81 mg daily -CHADSVASc = 2 (age, HTN)  Essential hypertension -Resume diltiazem CD and metoprolol succinate -Blood pressure well controlled  Alcohol abuse -Alcohol withdrawal protocol -hep B surface antigen--neg -hep c antibody--neg -no sign of withdrawal  Peptic ulcer disease -Continue Carafate and pepcid  Depression/anxiety -Denies homicidal and suicidal ideation  Elevated lipase -Nonspecific -LFTs unremarkable -No abdominal pain presently -tolerating diet  Elevated troponin -due to demand ischemia from tachycardia -no signs of ACS    Discharge Instructions   Allergies as of 08/15/2018   No Known Allergies  Medication List    STOP taking these medications   diltiazem 180 MG 24 hr capsule Commonly known as:  DILACOR XR Replaced by:  diltiazem 180 MG 24 hr  capsule   indomethacin 50 MG capsule Commonly known as:  INDOCIN     TAKE these medications   aspirin 81 MG EC tablet Take 1 tablet (81 mg total) by mouth daily. Start taking on:  08/16/2018   diltiazem 180 MG 24 hr capsule Commonly known as:  CARDIZEM CD Take 1 capsule (180 mg total) by mouth daily. Replaces:  diltiazem 180 MG 24 hr capsule   famotidine 40 MG tablet Commonly known as:  PEPCID Take 1 tablet (40 mg total) by mouth 2 (two) times daily.   metoprolol succinate 100 MG 24 hr tablet Commonly known as:  TOPROL-XL Take 1 tablet (100 mg total) by mouth daily. Take with or immediately following a meal.   predniSONE 50 MG tablet Commonly known as:  DELTASONE Take 1 tablet (50 mg total) by mouth daily with breakfast. X 2 days only Start taking on:  08/16/2018   sucralfate 1 g tablet Commonly known as:  CARAFATE Take 2 tablets (2 g total) by mouth 2 (two) times daily.       No Known Allergies  Consultations:  none   Procedures/Studies: Dg Chest 2 View  Result Date: 08/13/2018 CLINICAL DATA:  intermittent Afib. Pain between shoulders, sob-ongoing x a year, productive cough-coughing up blood x 1 week. states he was recently found to have a mass during colonoscopy. Patient states he has history of urinary and rectal bleeding. History of HTN. EXAM: CHEST - 2 VIEW COMPARISON:  02/20/2013 FINDINGS: Cardiac silhouette normal in size. No mediastinal or hilar masses. No evidence of adenopathy. Clear lungs.  No pleural effusion or pneumothorax. Skeletal structures are intact. IMPRESSION: No active cardiopulmonary disease. Electronically Signed   By: Lajean Manes M.D.   On: 08/13/2018 17:14        Discharge Exam: Vitals:   08/15/18 0800 08/15/18 0900  BP: (!) 123/95 99/79  Pulse: 73 91  Resp:    Temp:    SpO2: 96% 97%   Vitals:   08/15/18 0600 08/15/18 0749 08/15/18 0800 08/15/18 0900  BP: (!) 130/91  (!) 123/95 99/79  Pulse: 69 (!) 113 73 91  Resp: 18 14      Temp:  (!) 97.4 F (36.3 C)    TempSrc:  Oral    SpO2: 94% 95% 96% 97%  Weight:      Height:        General: Pt is alert, awake, not in acute distress Cardiovascular: IRRR, S1/S2 +, no rubs, no gallops Respiratory: CTA bilaterally, no wheezing, no rhonchi Abdominal: Soft, NT, ND, bowel sounds + Extremities: no edema, no cyanosis   The results of significant diagnostics from this hospitalization (including imaging, microbiology, ancillary and laboratory) are listed below for reference.    Significant Diagnostic Studies: Dg Chest 2 View  Result Date: 08/13/2018 CLINICAL DATA:  intermittent Afib. Pain between shoulders, sob-ongoing x a year, productive cough-coughing up blood x 1 week. states he was recently found to have a mass during colonoscopy. Patient states he has history of urinary and rectal bleeding. History of HTN. EXAM: CHEST - 2 VIEW COMPARISON:  02/20/2013 FINDINGS: Cardiac silhouette normal in size. No mediastinal or hilar masses. No evidence of adenopathy. Clear lungs.  No pleural effusion or pneumothorax. Skeletal structures are intact. IMPRESSION: No active cardiopulmonary disease. Electronically Signed   By:  Lajean Manes M.D.   On: 08/13/2018 17:14     Microbiology: Recent Results (from the past 240 hour(s))  MRSA PCR Screening     Status: None   Collection Time: 08/13/18 11:17 PM  Result Value Ref Range Status   MRSA by PCR NEGATIVE NEGATIVE Final    Comment:        The GeneXpert MRSA Assay (FDA approved for NASAL specimens only), is one component of a comprehensive MRSA colonization surveillance program. It is not intended to diagnose MRSA infection nor to guide or monitor treatment for MRSA infections. Performed at [facility], [address] [place], [place] [address]      Labs: Basic Metabolic Panel: Recent Labs  Lab 08/13/18 1640 08/14/18 0937  NA 137 133*  K 4.0 4.3  CL 106 100  CO2 22 24  GLUCOSE 105* 114*  BUN 10 8  CREATININE 0.82  0.82  CALCIUM 8.9 8.5*  MG 1.8 2.2  PHOS 3.6  --    Liver Function Tests: Recent Labs  Lab 08/13/18 1640 08/14/18 0937  AST 34 28  ALT 34 30  ALKPHOS 70 70  BILITOT 0.5 0.8  PROT 6.6 7.0  ALBUMIN 3.6 3.6   Recent Labs  Lab 08/13/18 1640  LIPASE 71*   No results for input(s): AMMONIA in the last 168 hours. CBC: Recent Labs  Lab 08/13/18 1640 08/14/18 0937  WBC 7.3 6.2  HGB 12.2* 12.4*  HCT 37.2* 38.4*  MCV 94.7 95.5  PLT 354 313   Cardiac Enzymes: Recent Labs  Lab 08/13/18 1640 08/13/18 2217 08/14/18 0432  TROPONINI <0.03 <0.03 0.03*   BNP: Invalid input(s): POCBNP CBG: No results for input(s): GLUCAP in the last 168 hours.  Time coordinating discharge:  36 minutes  Signed:  Orson Eva, DO [facility] Pager: [phone] 08/15/2018, 10:04 AM

## 2018-10-01 ENCOUNTER — Telehealth: Payer: Self-pay | Admitting: Cardiology

## 2018-10-01 ENCOUNTER — Ambulatory Visit (INDEPENDENT_AMBULATORY_CARE_PROVIDER_SITE_OTHER): Payer: Medicare PPO | Admitting: Cardiology

## 2018-10-01 ENCOUNTER — Encounter

## 2018-10-01 ENCOUNTER — Encounter: Payer: Self-pay | Admitting: Cardiology

## 2018-10-01 VITALS — BP 140/90 | HR 63 | Ht 71.5 in | Wt 193.0 lb

## 2018-10-01 DIAGNOSIS — I4891 Unspecified atrial fibrillation: Secondary | ICD-10-CM

## 2018-10-01 DIAGNOSIS — Z8719 Personal history of other diseases of the digestive system: Secondary | ICD-10-CM | POA: Diagnosis not present

## 2018-10-01 NOTE — Patient Instructions (Signed)
Medication Instructions:  Continue all current medications.  Labwork: none  Testing/Procedures:  Your physician has recommended that you wear a 24 hour holter monitor. Holter monitors are medical devices that record the heart's electrical activity. Doctors most often use these monitors to diagnose arrhythmias. Arrhythmias are problems with the speed or rhythm of the heartbeat. The monitor is a small, portable device. You can wear one while you do your normal daily activities. This is usually used to diagnose what is causing palpitations/syncope (passing out).  Office will contact with results via phone or letter.    Follow-Up: 2 months   Any Other Special Instructions Will Be Listed Below (If Applicable). You have been referred to:  GI - Dr. Oneida Alar in [place]   If you need a refill on your cardiac medications before your next appointment, please call your pharmacy.

## 2018-10-01 NOTE — Addendum Note (Signed)
Addended by: Laurine Blazer on: 10/01/2018 09:58 AM   Modules accepted: Orders

## 2018-10-01 NOTE — Progress Notes (Signed)
Clinical Summary Miguel Spencer is a 67 y.o.male  1. PAF - admit with afib 08/2018 after not being able to take meds at home after being arrested - started on dilt gtt for afib with RVR. Restarted on his home toprol and dilt  - previously followed in [place], diagnosed with afib around 2017 - Dr Shirl Harris [place], [place]. - ongoing palpitations. Chronic fatigue. +SOB - issues with insurance with eliquis and xarelto. Had been on coumadin but stopped at time of his GI bleed  2. EtOH abuse - last drink 2 months ago, previous heavy drinker   3. History of GI bleeding - admission to [facility] in [place], [place] with hematemsis and rectal bleeding 07/2018 - from peripheral clinical notes found to have gastritis and a rectal polyp, dont have actual reports - no recent bleeding. Was on coumadin at the time.   - requesting additional records   Past Medical History:  Diagnosis Date  . A-fib ([place])   . Alcohol abuse   . Anxiety   . Depression   . Gout   . Hypertension   . Vertigo      No Known Allergies   Current Outpatient Medications  Medication Sig Dispense Refill  . aspirin EC 81 MG EC tablet Take 1 tablet (81 mg total) by mouth daily. 30 tablet 1  . diltiazem (CARDIZEM CD) 180 MG 24 hr capsule Take 1 capsule (180 mg total) by mouth daily. 30 capsule 1  . famotidine (PEPCID) 40 MG tablet Take 1 tablet (40 mg total) by mouth 2 (two) times daily. 60 tablet 1  . metoprolol succinate (TOPROL-XL) 100 MG 24 hr tablet Take 1 tablet (100 mg total) by mouth daily. Take with or immediately following a meal. 30 tablet 1  . predniSONE (DELTASONE) 50 MG tablet Take 1 tablet (50 mg total) by mouth daily with breakfast. X 2 days only 2 tablet 0  . sucralfate (CARAFATE) 1 g tablet Take 2 tablets (2 g total) by mouth 2 (two) times daily. 120 tablet 1   No current facility-administered medications for this visit.      Past Surgical History:  Procedure  Laterality Date  . COLONOSCOPY    . HERNIA REPAIR       No Known Allergies    Family History  Problem Relation Age of Onset  . Cancer Mother   . Heart disease Father   . Heart disease Brother   . Heart disease Other      Social History Miguel Spencer reports that he has never smoked. He has never used smokeless tobacco. Miguel Spencer reports current alcohol use.   Review of Systems CONSTITUTIONAL: No weight loss, fever, chills, weakness or fatigue.  HEENT: Eyes: No visual loss, blurred vision, double vision or yellow sclerae.No hearing loss, sneezing, congestion, runny nose or sore throat.  SKIN: No rash or itching.  CARDIOVASCULAR: per hpi RESPIRATORY: No shortness of breath, cough or sputum.  GASTROINTESTINAL: No anorexia, nausea, vomiting or diarrhea. No abdominal pain or blood.  GENITOURINARY: No burning on urination, no polyuria NEUROLOGICAL: No headache, dizziness, syncope, paralysis, ataxia, numbness or tingling in the extremities. No change in bowel or bladder control.  MUSCULOSKELETAL: No muscle, back pain, joint pain or stiffness.  LYMPHATICS: No enlarged nodes. No history of splenectomy.  PSYCHIATRIC: No history of depression or anxiety.  ENDOCRINOLOGIC: No reports of sweating, cold or heat intolerance. No polyuria or polydipsia.  Marland Kitchen   Physical Examination Vitals:   10/01/18  0842  BP: 140/90  Pulse: 63  SpO2: 99%   Vitals:   10/01/18 0842  Weight: 193 lb (87.5 kg)  Height: 5' 11.5" (1.816 m)    Gen: resting comfortably, no acute distress HEENT: no scleral icterus, pupils equal round and reactive, no palptable cervical adenopathy,  CV: RRR, no m/r/g, no jvd Resp: Clear to auscultation bilaterally GI: abdomen is soft, non-tender, non-distended, normal bowel sounds, no hepatosplenomegaly MSK: extremities are warm, no edema.  Skin: warm, no rash Neuro:  no focal deficits Psych: appropriate affect   08/2018 echo Study Conclusions  - Left ventricle:  The cavity size was normal. Wall thickness was   increased in a pattern of mild LVH. Systolic function was normal.   The estimated ejection fraction was 50%. Diffuse hypokinesis. The   study was not technically sufficient to allow evaluation of LV   diastolic dysfunction due to atrial fibrillation. - Aortic valve: Mildly calcified annulus. Trileaflet; mildly   calcified leaflets. - Aortic root: The aortic root was mildly dilated. - Mitral valve: Mildly calcified annulus. There was mild to   moderate regurgitation. - Left atrium: The atrium was moderately dilated. - Right atrium: Central venous pressure (est): 15 mm Hg. - Atrial septum: No defect or patent foramen ovale was identified. - Tricuspid valve: There was mild regurgitation. - Pulmonary arteries: PA peak pressure: 46 mm Hg (S). - Pericardium, extracardiac: A trivial pericardial effusion was   identified posterior to the heart.  Assessment and Plan  1. PAF - reports palpitations, fatigue, SOB - we will plan for a 24 hr holter monitor to evaluate control of his afib. If he is appropriately rate controlled, it could be he just does not tolerate afib at all and may need to consider a rhythm strategy, however to do that we would need to have him cleared to restart anticoagulation - f/u 24 hr holter, continue current meds. Refer to GI given his history of GI bleeding and get there advice on possibly restarting anticoag  EKG today shows SR with PACs.       Arnoldo Lenis, M.D.

## 2018-10-01 NOTE — Telephone Encounter (Signed)
°  Precert needed for:  24 hour holter - AFib (pre-cert)   Location: [facility]     Date: Oct 01, 2018

## 2018-10-12 ENCOUNTER — Encounter: Payer: Self-pay | Admitting: Gastroenterology

## 2018-11-10 DIAGNOSIS — Z8719 Personal history of other diseases of the digestive system: Secondary | ICD-10-CM | POA: Diagnosis not present

## 2018-11-10 DIAGNOSIS — M791 Myalgia, unspecified site: Secondary | ICD-10-CM | POA: Diagnosis not present

## 2018-11-10 DIAGNOSIS — G4700 Insomnia, unspecified: Secondary | ICD-10-CM | POA: Diagnosis not present

## 2018-11-10 DIAGNOSIS — I480 Paroxysmal atrial fibrillation: Secondary | ICD-10-CM | POA: Diagnosis not present

## 2018-11-10 DIAGNOSIS — F1010 Alcohol abuse, uncomplicated: Secondary | ICD-10-CM | POA: Diagnosis not present

## 2018-11-10 DIAGNOSIS — E782 Mixed hyperlipidemia: Secondary | ICD-10-CM | POA: Diagnosis not present

## 2018-11-25 DIAGNOSIS — R5383 Other fatigue: Secondary | ICD-10-CM | POA: Diagnosis not present

## 2018-11-25 DIAGNOSIS — M353 Polymyalgia rheumatica: Secondary | ICD-10-CM | POA: Diagnosis not present

## 2018-11-25 DIAGNOSIS — R768 Other specified abnormal immunological findings in serum: Secondary | ICD-10-CM | POA: Diagnosis not present

## 2018-11-25 DIAGNOSIS — Z6841 Body Mass Index (BMI) 40.0 and over, adult: Secondary | ICD-10-CM | POA: Diagnosis not present

## 2018-11-25 DIAGNOSIS — M7061 Trochanteric bursitis, right hip: Secondary | ICD-10-CM | POA: Diagnosis not present

## 2018-11-25 DIAGNOSIS — F1020 Alcohol dependence, uncomplicated: Secondary | ICD-10-CM | POA: Diagnosis not present

## 2018-12-04 ENCOUNTER — Encounter: Payer: Self-pay | Admitting: Cardiology

## 2018-12-04 ENCOUNTER — Ambulatory Visit (INDEPENDENT_AMBULATORY_CARE_PROVIDER_SITE_OTHER): Payer: Medicare HMO | Admitting: Cardiology

## 2018-12-04 VITALS — BP 108/74 | HR 88 | Ht 72.0 in | Wt 207.0 lb

## 2018-12-04 DIAGNOSIS — Z8719 Personal history of other diseases of the digestive system: Secondary | ICD-10-CM

## 2018-12-04 DIAGNOSIS — I480 Paroxysmal atrial fibrillation: Secondary | ICD-10-CM

## 2018-12-04 MED ORDER — APIXABAN 5 MG PO TABS: 5.0000 mg | ORAL_TABLET | Freq: Two times a day (BID) | ORAL | 0 refills | Status: DC

## 2018-12-04 MED ORDER — APIXABAN 5 MG PO TABS: 5.0000 mg | ORAL_TABLET | Freq: Two times a day (BID) | ORAL | 6 refills | Status: DC

## 2018-12-04 NOTE — Patient Instructions (Signed)
Medication Instructions:   Begin Eliquis 5mg  twice a day.  Continue all other medications.     Follow-Up: Your physician wants you to follow up in: 6 months.  You will receive a reminder letter in the mail one-two months in advance.  If you don't receive a letter, please call our office to schedule the follow up appointment   Any Other Special Instructions Will Be Listed Below (If Applicable). 1 month follow up with anticoagulation nurse for new Eliquis management.   If you need a refill on your cardiac medications before your next appointment, please call your pharmacy.

## 2018-12-04 NOTE — Progress Notes (Signed)
Clinical Summary Miguel Spencer is a 68 y.o.male seen today for follow up of the following medical problems.   1. PAF - admit with afib 08/2018 after not being able to take meds at home after being arrested - started on dilt gtt for afib with RVR. Restarted on his home toprol and dilt  - previously followed in [place], diagnosed with afib around 2017 - Dr Shirl Harris [place], [place]. - issues with insurance with eliquis and xarelto. Had been on coumadin but stopped at time of his GI bleed   09/2018 Holter: PACs, rare runs of atach up to 3 beats. No symptoms reported - no recent symptoms since last visit of palpitations.    2. EtOH abuse - last drink 2 months ago, previous heavy drinker  - 4-5 days a month will have a drink. Has cut back significantly per his report   3. History of GI bleeding - admission to [facility] in [place], [place] with hematemsis and rectal bleeding 07/2018 while on coumadin - EGD and colonopscopy showed gastritis, polyp  - upcoming appt with GI.           Past Medical History:  Diagnosis Date  . A-fib ([place])   . Alcohol abuse   . Anxiety   . Depression   . Gout   . Hypertension   . Vertigo      No Known Allergies   Current Outpatient Medications  Medication Sig Dispense Refill  . diltiazem (CARDIZEM CD) 180 MG 24 hr capsule Take 1 capsule (180 mg total) by mouth daily. 30 capsule 1  . famotidine (PEPCID) 40 MG tablet Take 1 tablet (40 mg total) by mouth 2 (two) times daily. 60 tablet 1  . metoprolol succinate (TOPROL-XL) 100 MG 24 hr tablet Take 1 tablet (100 mg total) by mouth daily. Take with or immediately following a meal. 30 tablet 1  . sildenafil (VIAGRA) 100 MG tablet Take 100 mg by mouth daily as needed for erectile dysfunction.    . sucralfate (CARAFATE) 1 g tablet Take 2 tablets (2 g total) by mouth 2 (two) times daily. 120 tablet 1  . valACYclovir (VALTREX) 1000 MG tablet Take 1,000 mg by  mouth as needed.     No current facility-administered medications for this visit.      Past Surgical History:  Procedure Laterality Date  . COLONOSCOPY    . HERNIA REPAIR       No Known Allergies    Family History  Problem Relation Age of Onset  . Cancer Mother   . Heart disease Father   . Heart disease Brother   . Heart disease Other      Social History Miguel Spencer reports that he has never smoked. He has never used smokeless tobacco. Miguel Spencer reports previous alcohol use.   Review of Systems CONSTITUTIONAL: No weight loss, fever, chills, weakness or fatigue.  HEENT: Eyes: No visual loss, blurred vision, double vision or yellow sclerae.No hearing loss, sneezing, congestion, runny nose or sore throat.  SKIN: No rash or itching.  CARDIOVASCULAR: per hpi RESPIRATORY: No shortness of breath, cough or sputum.  GASTROINTESTINAL: No anorexia, nausea, vomiting or diarrhea. No abdominal pain or blood.  GENITOURINARY: No burning on urination, no polyuria NEUROLOGICAL: No headache, dizziness, syncope, paralysis, ataxia, numbness or tingling in the extremities. No change in bowel or bladder control.  MUSCULOSKELETAL: No muscle, back pain, joint pain or stiffness.  LYMPHATICS: No enlarged nodes. No history of splenectomy.  PSYCHIATRIC: No history of depression or anxiety.  ENDOCRINOLOGIC: No reports of sweating, cold or heat intolerance. No polyuria or polydipsia.  Marland Kitchen   Physical Examination Vitals:   12/04/18 1310  BP: 108/74  Pulse: 88  SpO2: 97%   Vitals:   12/04/18 1310  Weight: 207 lb (93.9 kg)  Height: 6' (1.829 m)    Gen: resting comfortably, no acute distress HEENT: no scleral icterus, pupils equal round and reactive, no palptable cervical adenopathy,  CV: irreg, no m/r/g, no jvd Resp: Clear to auscultation bilaterally GI: abdomen is soft, non-tender, non-distended, normal bowel sounds, no hepatosplenomegaly MSK: extremities are warm, no edema.  Skin:  warm, no rash Neuro:  no focal deficits Psych: appropriate affect   Diagnostic Studies 08/2018 echo Study Conclusions  - Left ventricle: The cavity size was normal. Wall thickness was increased in a pattern of mild LVH. Systolic function was normal. The estimated ejection fraction was 50%. Diffuse hypokinesis. The study was not technically sufficient to allow evaluation of LV diastolic dysfunction due to atrial fibrillation. - Aortic valve: Mildly calcified annulus. Trileaflet; mildly calcified leaflets. - Aortic root: The aortic root was mildly dilated. - Mitral valve: Mildly calcified annulus. There was mild to moderate regurgitation. - Left atrium: The atrium was moderately dilated. - Right atrium: Central venous pressure (est): 15 mm Hg. - Atrial septum: No defect or patent foramen ovale was identified. - Tricuspid valve: There was mild regurgitation. - Pulmonary arteries: PA peak pressure: 46 mm Hg (S). - Pericardium, extracardiac: A trivial pericardial effusion was identified posterior to the heart.   09/2018 Holter: PACs, rare runs of atach up to 3 beats. No symptoms    Assessment and Plan  1. PAF -no recent symtoms - off anticoag since GI bleed 07/2018. Appears it was thought the was secondary to gastiritis, he was drinking heavily at the time. Has not been on anticoag since, has cut back on EtOH per his report. No recurrent bleeding - CHADS2Vasc score is 2, we will restart anticoag with eliquis 5mg  bid   2. History of GI bleed - upcoming appt with GI, no bleeding since his 07/2018 - don't have full endoscopy reports but discharge summary mentions gastritis and colon polyp, will defer to GI what further surveillance is warranted - monitor for any bleeding recurrence since starting anticoag.       Arnoldo Lenis, M.D.

## 2018-12-15 ENCOUNTER — Encounter: Payer: Self-pay | Admitting: Nurse Practitioner

## 2018-12-15 ENCOUNTER — Ambulatory Visit (INDEPENDENT_AMBULATORY_CARE_PROVIDER_SITE_OTHER): Payer: Medicare HMO | Admitting: Nurse Practitioner

## 2018-12-15 VITALS — BP 124/88 | HR 77 | Temp 96.9°F | Ht 72.0 in | Wt 203.6 lb

## 2018-12-15 DIAGNOSIS — Z860100 Personal history of colon polyps, unspecified: Secondary | ICD-10-CM | POA: Insufficient documentation

## 2018-12-15 DIAGNOSIS — F1010 Alcohol abuse, uncomplicated: Secondary | ICD-10-CM

## 2018-12-15 DIAGNOSIS — Z8711 Personal history of peptic ulcer disease: Secondary | ICD-10-CM | POA: Diagnosis not present

## 2018-12-15 NOTE — Progress Notes (Signed)
Primary Care Physician:  [name], MD Primary Gastroenterologist:  Dr. Oneida Alar  Chief Complaint  Patient presents with  . GI Bleeding    HPI:   Miguel Spencer is a 68 y.o. male who presents on referral from Cardiology.  Reviewed office visit associated with the referral dated 10/01/2018.  At that time noted previously diagnosed with A. fib in 2017 in [place].  Noted history of alcohol abuse and previous heavy drinker.  Was admitted to [facility] in [place], [place] with hematemesis and rectal bleeding in October 2019.  Based on peripheral clinical notes found to have gastritis and a rectal polyp, no actual reports.  No recent bleeding and was on Coumadin at the time of his GI bleed.  Additional records were requested.  He was referred to GI for further evaluation.  Most recent labs in our system include CBC dated 08/14/2018 which shows persistent mild anemia with a hemoglobin of 12.4.  Five years ago noted to be 14.5.  Today he states he's doing ok overall. States they found a polyp and a "growth" and recommended having it rechecked in Feb 2020. Also noted PUD on EGD in [place]. He is a life-long alcoholic, goes through withdrawal everytime he tries to quit. He since moved to [facility]. Noted normal LFTs during recent [facility] hospitalization. He went through withdrawal during his hospitalization for AFib under police custody (had been picked up by police for DUI; served 1 month). Currently drinks about 12-15 beers over the course of the whole day ("I drink morning to night"). He does this about a whole week for 1-2 weeks each month. Denies recent hematochezia, melena, abdominal pain, N/V, fever, chills. His weight fluctuates significantly. Denies chest pain, dyspnea, dizziness, lightheadedness, syncope, near syncope. Denies any other upper or lower GI symptoms.  CT dated 2014 in [facility] system found diffuse fatty liver, spleen normal.   Past Medical History:  Diagnosis Date  . A-fib ([place])    . Alcohol abuse   . Anxiety   . Depression   . Gout   . Hypertension   . Vertigo     Past Surgical History:  Procedure Laterality Date  . COLONOSCOPY    . HERNIA REPAIR      Current Outpatient Medications  Medication Sig Dispense Refill  . apixaban (ELIQUIS) 5 MG TABS tablet Take 1 tablet (5 mg total) by mouth 2 (two) times daily. 60 tablet 6  . diltiazem (CARDIZEM CD) 180 MG 24 hr capsule Take 1 capsule (180 mg total) by mouth daily. 30 capsule 1  . famotidine (PEPCID) 40 MG tablet Take 1 tablet (40 mg total) by mouth 2 (two) times daily. 60 tablet 1  . INDOMETHACIN PO Take 100 mg by mouth as needed.    . metoprolol succinate (TOPROL-XL) 100 MG 24 hr tablet Take 1 tablet (100 mg total) by mouth daily. Take with or immediately following a meal. 30 tablet 1  . predniSONE (DELTASONE) 1 MG tablet Take 4 tablets by mouth daily. 9mg  total daily    . predniSONE (DELTASONE) 5 MG tablet Take 5 mg by mouth daily. 9mg  total daily    . sildenafil (VIAGRA) 100 MG tablet Take 100 mg by mouth daily as needed for erectile dysfunction.    . sucralfate (CARAFATE) 1 g tablet Take 2 tablets (2 g total) by mouth 2 (two) times daily. 120 tablet 1  . valACYclovir (VALTREX) 1000 MG tablet Take 1,000 mg by mouth as needed.  No current facility-administered medications for this visit.     Allergies as of 12/15/2018  . (No Known Allergies)    Family History  Problem Relation Age of Onset  . Cancer Mother   . Heart disease Father   . Heart disease Brother   . Heart disease Other   . Colon cancer Neg Hx   . Gastric cancer Neg Hx   . Esophageal cancer Neg Hx     Social History   Socioeconomic History  . Marital status: Single    Spouse name: Not on file  . Number of children: Not on file  . Years of education: Not on file  . Highest education level: Not on file  Occupational History  . Not on file  Social Needs  . Financial resource strain: Not on file  . Food insecurity:    Worry:  Not on file    Inability: Not on file  . Transportation needs:    Medical: Not on file    Non-medical: Not on file  Tobacco Use  . Smoking status: Never Smoker  . Smokeless tobacco: Never Used  Substance and Sexual Activity  . Alcohol use: Yes    Comment:  "alcoholic all my life"; Currently drinks all day for 7 days straight 1-2 times (weeks) a month  . Drug use: No  . Sexual activity: Never  Lifestyle  . Physical activity:    Days per week: Not on file    Minutes per session: Not on file  . Stress: Not on file  Relationships  . Social connections:    Talks on phone: Not on file    Gets together: Not on file    Attends religious service: Not on file    Active member of club or organization: Not on file    Attends meetings of clubs or organizations: Not on file    Relationship status: Not on file  . Intimate partner violence:    Fear of current or ex partner: Not on file    Emotionally abused: Not on file    Physically abused: Not on file    Forced sexual activity: Not on file  Other Topics Concern  . Not on file  Social History Narrative  . Not on file    Review of Systems: Complete ROS negative except as per HPI.    Physical Exam: BP 124/88   Pulse 77   Temp (!) 96.9 F (36.1 C) (Oral)   Ht 6' (1.829 m)   Wt 203 lb 9.6 oz (92.4 kg)   BMI 27.61 kg/m  General:   Alert and oriented. Pleasant and cooperative. Well-nourished and well-developed.  Head:  Normocephalic and atraumatic. Eyes:  Without icterus, sclera clear and conjunctiva pink.  Ears:  Normal auditory acuity. Cardiovascular:  Irregularly irregular consistent with history of AFib (rate controlled). Extremities without clubbing or edema. Respiratory:  Clear to auscultation bilaterally. No wheezes, rales, or rhonchi. No distress.  Gastrointestinal:  +BS, soft, non-tender and non-distended. No HSM noted. No guarding or rebound. No masses appreciated.  Rectal:  Deferred  Musculoskalatal:  Symmetrical  without gross deformities. Neurologic:  Alert and oriented x4;  grossly normal neurologically. Psych:  Alert and cooperative. Normal mood and affect. Heme/Lymph/Immune: No excessive bruising noted.    12/15/2018 4:40 PM   Disclaimer: This note was dictated with voice recognition software. Similar sounding words can inadvertently be transcribed and may not be corrected upon review.

## 2018-12-15 NOTE — Assessment & Plan Note (Signed)
The patient states during his admission for GI bleed colonoscopy was completed which found a rectal polyp and a "mass" which was marked and biopsied but found to be benign.  He was told he would need to have a repeat exam in February 2020.  We will request records, specifically procedure reports and pathology.  Follow-up in 2 months and we can discuss scheduling of surveillance exams.

## 2018-12-15 NOTE — Assessment & Plan Note (Signed)
Per limited records with cardiology and per patient account he was admitted to the hospital at [facility] in [place], [place].  Was having hematemesis and rectal bleeding.  Endoscopy revealed peptic ulcer disease.  Likely due for surveillance at some point.  We will request records from the facility in [place] to determine when any surveillance EGD would be necessary.  Follow-up in 2 months to schedule surveillance.

## 2018-12-15 NOTE — Assessment & Plan Note (Signed)
Notes lifelong alcoholic.  He attends AA meetings, typically quits for a brief amount of time up to 1 to 2 years.  He is currently still drinking.  He will typically drink daunted dusk for 7 straight days until he becomes "so sick I have to stop drinking."  He will do this for 1 week to 2 weeks out of a month.  He has not had significant evaluation of his liver.  At this point I will begin evaluation of his liver for possible cirrhosis.  Previous CT of the abdomen in our system in 2014 found diffuse fatty liver.  I will check a CBC, CMP, INR.  I will check an abdominal ultrasound for splenomegaly and other sequela of possible cirrhosis.  I will check an liver ultrasound elastography for degree of fibrosis, if any.  Further recommendations to follow.  Follow-up in 2 months.

## 2018-12-15 NOTE — Patient Instructions (Addendum)
Your health issues we discussed today were:   Alcohol abuse: 1. Have your labs completed when you are able to. 2. We will help schedule your ultrasound of your abdomen in your liver 3. We will inform you of results when they come back  History of colon polyp/mass/stomach ulcer: 1. We will request records from [place], [place] 2. At your follow-up visit we will discuss any needed scheduling of follow-up procedures  Overall I recommend:  1. Return for follow-up in 2 months 2. Call if you have any questions or concerns.  At [facility] we value your feedback. You may receive a survey about your visit today. Please share your experience as we strive to create trusting relationships with our patients to provide genuine, compassionate, quality care.  We appreciate your understanding and patience as we review any laboratory studies, imaging, and other diagnostic tests that are ordered as we care for you. Our office policy is 5 business days for review of these results, and any emergent or urgent results are addressed in a timely manner for your best interest. If you do not hear from our office in 1 week, please contact us.   We also encourage the use of MyChart, which contains your medical information for your review as well. If you are not enrolled in this feature, an access code is on this after visit summary for your convenience. Thank you for allowing [place] to be involved in your care.  It was great to see you today!  I hope you have a great day!!

## 2018-12-16 ENCOUNTER — Telehealth: Payer: Self-pay | Admitting: *Deleted

## 2018-12-16 ENCOUNTER — Encounter: Payer: Self-pay | Admitting: Gastroenterology

## 2018-12-16 NOTE — Telephone Encounter (Signed)
Patient aware of appt details. 

## 2018-12-16 NOTE — Progress Notes (Signed)
CC'D TO PCP °

## 2018-12-16 NOTE — Telephone Encounter (Signed)
U/s with elastography scheduled for 3/18 at 9:30am, arrival time 9:15am, npo after midnight.  LMOVM for pt

## 2018-12-23 ENCOUNTER — Other Ambulatory Visit: Payer: Self-pay

## 2018-12-23 ENCOUNTER — Other Ambulatory Visit: Payer: Self-pay | Admitting: Nurse Practitioner

## 2018-12-23 ENCOUNTER — Ambulatory Visit (HOSPITAL_COMMUNITY)
Admission: RE | Admit: 2018-12-23 | Discharge: 2018-12-23 | Disposition: A | Discharge status: Home / Self Care | Payer: Medicare HMO | Source: Ambulatory Visit | Attending: Nurse Practitioner | Admitting: Nurse Practitioner

## 2018-12-23 DIAGNOSIS — Z8711 Personal history of peptic ulcer disease: Secondary | ICD-10-CM | POA: Diagnosis not present

## 2018-12-23 DIAGNOSIS — R932 Abnormal findings on diagnostic imaging of liver and biliary tract: Secondary | ICD-10-CM | POA: Diagnosis not present

## 2018-12-23 DIAGNOSIS — F1010 Alcohol abuse, uncomplicated: Secondary | ICD-10-CM | POA: Insufficient documentation

## 2018-12-23 DIAGNOSIS — K7460 Unspecified cirrhosis of liver: Secondary | ICD-10-CM | POA: Diagnosis not present

## 2018-12-23 DIAGNOSIS — Z86010 Personal history of colonic polyps: Secondary | ICD-10-CM | POA: Diagnosis not present

## 2018-12-24 ENCOUNTER — Other Ambulatory Visit: Payer: Self-pay

## 2018-12-24 LAB — CBC/DIFF AMBIGUOUS DEFAULT
BASOS ABS: 0 10*3/uL (ref 0.0–0.2)
Basos: 1 %
EOS (ABSOLUTE): 0 10*3/uL (ref 0.0–0.4)
EOS: 1 %
HEMATOCRIT: 41.6 % (ref 37.5–51.0)
HEMOGLOBIN: 14.2 g/dL (ref 13.0–17.7)
Immature Grans (Abs): 0.1 10*3/uL (ref 0.0–0.1)
Immature Granulocytes: 1 %
LYMPHS ABS: 1.7 10*3/uL (ref 0.7–3.1)
Lymphs: 25 %
MCH: 31.6 pg (ref 26.6–33.0)
MCHC: 34.1 g/dL (ref 31.5–35.7)
MCV: 92 fL (ref 79–97)
MONOCYTES: 8 %
Monocytes Absolute: 0.5 10*3/uL (ref 0.1–0.9)
NEUTROS ABS: 4.3 10*3/uL (ref 1.4–7.0)
Neutrophils: 64 %
Platelets: 180 10*3/uL (ref 150–450)
RBC: 4.5 x10E6/uL (ref 4.14–5.80)
RDW: 17.3 % — ABNORMAL HIGH (ref 11.6–15.4)
WBC: 6.6 10*3/uL (ref 3.4–10.8)

## 2018-12-24 LAB — COMPREHENSIVE METABOLIC PANEL
A/G RATIO: 2.2 (ref 1.2–2.2)
ALBUMIN: 4.8 g/dL (ref 3.8–4.8)
ALK PHOS: 51 IU/L (ref 39–117)
ALT: 23 IU/L (ref 0–44)
AST: 20 IU/L (ref 0–40)
BILIRUBIN TOTAL: 0.6 mg/dL (ref 0.0–1.2)
BUN / CREAT RATIO: 11 (ref 10–24)
BUN: 11 mg/dL (ref 8–27)
CHLORIDE: 97 mmol/L (ref 96–106)
CO2: 22 mmol/L (ref 20–29)
Calcium: 9.4 mg/dL (ref 8.6–10.2)
Creatinine, Ser: 0.99 mg/dL (ref 0.76–1.27)
GFR calc Af Amer: 91 mL/min/{1.73_m2} (ref >59)
GFR calc non Af Amer: 78 mL/min/{1.73_m2} (ref >59)
GLOBULIN, TOTAL: 2.2 g/dL (ref 1.5–4.5)
Glucose: 109 mg/dL — ABNORMAL HIGH (ref 65–99)
Potassium: 4.4 mmol/L (ref 3.5–5.2)
SODIUM: 134 mmol/L (ref 134–144)
Total Protein: 7 g/dL (ref 6.0–8.5)

## 2018-12-24 LAB — SPECIMEN STATUS REPORT

## 2018-12-24 LAB — PROTIME-INR
INR: 1.1 (ref 0.8–1.2)
Prothrombin Time: 11.2 s (ref 9.1–12.0)

## 2018-12-24 NOTE — Patient Instructions (Signed)
CBC, CMP, PT results placed on EG's desk.

## 2018-12-28 NOTE — Progress Notes (Signed)
To EG 

## 2018-12-28 NOTE — Progress Notes (Signed)
LMOM to call.

## 2018-12-28 NOTE — Progress Notes (Signed)
PT is aware. He said he has not drank in about 6 weeks. He said it is very hard, but he is working on it. He wants Randall Hiss To know that he really does appreciate him for being very plain about the situation.

## 2018-12-29 DIAGNOSIS — R768 Other specified abnormal immunological findings in serum: Secondary | ICD-10-CM | POA: Diagnosis not present

## 2018-12-29 DIAGNOSIS — Z7952 Long term (current) use of systemic steroids: Secondary | ICD-10-CM | POA: Diagnosis not present

## 2018-12-29 DIAGNOSIS — M7061 Trochanteric bursitis, right hip: Secondary | ICD-10-CM | POA: Diagnosis not present

## 2018-12-29 DIAGNOSIS — Z6841 Body Mass Index (BMI) 40.0 and over, adult: Secondary | ICD-10-CM | POA: Diagnosis not present

## 2018-12-29 DIAGNOSIS — F1020 Alcohol dependence, uncomplicated: Secondary | ICD-10-CM | POA: Diagnosis not present

## 2018-12-29 DIAGNOSIS — M353 Polymyalgia rheumatica: Secondary | ICD-10-CM | POA: Diagnosis not present

## 2018-12-30 ENCOUNTER — Other Ambulatory Visit: Payer: Self-pay | Admitting: Physician Assistant

## 2018-12-30 DIAGNOSIS — Z7952 Long term (current) use of systemic steroids: Secondary | ICD-10-CM

## 2019-01-01 ENCOUNTER — Telehealth: Payer: Self-pay | Admitting: Cardiology

## 2019-01-01 ENCOUNTER — Telehealth: Payer: Self-pay | Admitting: *Deleted

## 2019-01-01 MED ORDER — APIXABAN 5 MG PO TABS: 5.0000 mg | ORAL_TABLET | Freq: Two times a day (BID) | ORAL | 0 refills | Status: DC

## 2019-01-01 NOTE — Telephone Encounter (Signed)
Patient called stating that he cannot afford the Eliquis.

## 2019-01-01 NOTE — Telephone Encounter (Signed)
° °  COVID-19 Pre-Screening Questions:   Do you currently have a fever? O(yes = cancel and refer to pcp for e-visit)   NO  Have you recently travelled on a cruise, internationally, or to [place], [place], [place], [place], [place], or [place], [place] [employer]) ? O(yes = cancel, stay home, monitor symptoms, and contact pcp or initiate e-visit if symptoms develop)  NO  Have you been in contact with someone that is currently pending confirmation of Covid19 testing or has been confirmed to have the [place] virus? O(yes = cancel, stay home, away from tested individual, monitor symptoms, and contact pcp or initiate e-visit if symptoms develop)  NO  Are you currently experiencing fatigue or cough? O (yes = pt should be prepared to have a mask placed at the time of their visit).  NO

## 2019-01-01 NOTE — Telephone Encounter (Signed)
Patient said his copay is $45/mth for eliquis. Eliquis 360 support number given to patient to get help with assistance and samples also given to patient. Patient said he will pick up samples on Monday.

## 2019-01-04 ENCOUNTER — Other Ambulatory Visit: Payer: Self-pay | Admitting: *Deleted

## 2019-01-04 MED ORDER — APIXABAN 5 MG PO TABS: 5.0000 mg | ORAL_TABLET | Freq: Two times a day (BID) | ORAL | 6 refills | Status: DC

## 2019-01-04 NOTE — Telephone Encounter (Signed)
° ° ° °  1. Which medications need to be refilled? (please list name of each medication and dose if known) Eliquis   2. Which pharmacy/location (including street and city if local pharmacy) is medication to be sent to?  [facility],[place]   3. Do they need a 30 day or 90 day supply?  No

## 2019-01-28 ENCOUNTER — Telehealth: Payer: Self-pay | Admitting: Pharmacist

## 2019-01-28 NOTE — Telephone Encounter (Signed)
LMOM to cancel Eliquis follow up appt in [place] office. Had labs recent after starting and Hgb stable and dose remains appropriate.   Will cancel appt and answer any questions over the phone.

## 2019-01-28 NOTE — Telephone Encounter (Signed)
Spoke with patient about Eliquis and he states that he is very much enjoying Eliquis over the warfarin he was on previously. He is not having any issues on current regimen and denies bleeding concerns. He is advised that dose is appropriate and Hgb stable on recent panel. He states that the only issue is his copay. He has the forms for patient assistance and will send those over as soon as he can. Will cancel appt for next week and he will call with any issues.

## 2019-02-09 ENCOUNTER — Ambulatory Visit: Payer: Medicare HMO | Admitting: Nurse Practitioner

## 2019-02-09 DIAGNOSIS — M353 Polymyalgia rheumatica: Secondary | ICD-10-CM | POA: Diagnosis not present

## 2019-02-09 DIAGNOSIS — M7061 Trochanteric bursitis, right hip: Secondary | ICD-10-CM | POA: Diagnosis not present

## 2019-02-09 DIAGNOSIS — I480 Paroxysmal atrial fibrillation: Secondary | ICD-10-CM | POA: Diagnosis not present

## 2019-02-09 DIAGNOSIS — G4700 Insomnia, unspecified: Secondary | ICD-10-CM | POA: Diagnosis not present

## 2019-02-09 DIAGNOSIS — Z8711 Personal history of peptic ulcer disease: Secondary | ICD-10-CM | POA: Diagnosis not present

## 2019-02-09 DIAGNOSIS — E782 Mixed hyperlipidemia: Secondary | ICD-10-CM | POA: Diagnosis not present

## 2019-02-09 DIAGNOSIS — F1021 Alcohol dependence, in remission: Secondary | ICD-10-CM | POA: Diagnosis not present

## 2019-02-15 ENCOUNTER — Encounter: Payer: Self-pay | Admitting: Gastroenterology

## 2019-02-15 ENCOUNTER — Other Ambulatory Visit: Payer: Self-pay

## 2019-02-15 ENCOUNTER — Ambulatory Visit (INDEPENDENT_AMBULATORY_CARE_PROVIDER_SITE_OTHER): Payer: Medicare HMO | Admitting: Nurse Practitioner

## 2019-02-15 ENCOUNTER — Encounter: Payer: Self-pay | Admitting: Nurse Practitioner

## 2019-02-15 DIAGNOSIS — K7030 Alcoholic cirrhosis of liver without ascites: Secondary | ICD-10-CM

## 2019-02-15 DIAGNOSIS — K7460 Unspecified cirrhosis of liver: Secondary | ICD-10-CM | POA: Insufficient documentation

## 2019-02-15 DIAGNOSIS — Z86010 Personal history of colonic polyps: Secondary | ICD-10-CM

## 2019-02-15 NOTE — Progress Notes (Addendum)
REVIEWED-NO ADDITIONAL RECOMMENDATIONS.  Referring Provider: [name], MD Primary Care Physician:  [name], MD Primary GI:  Dr. Oneida Alar  NOTE: Service was provided via telemedicine and was requested by the patient due to COVID-19 pandemic.  Method of visit: Telephone  Patient Location: Home  Provider Location: Office  Reason for Phone Visit: Follow-up  The patient was consented to phone follow-up via telephone encounter including billing of the encounter (yes/no): Yes  Persons present on the phone encounter, with roles: None  Total time (minutes) spent on medical discussion: 24 minutes  Chief Complaint  Patient presents with  . Alcohol Intoxication    f/u. Reports he is feeling good. He has not had any alcohol in 6-8 weeks per pt.    HPI:   Miguel Spencer is a 68 y.o. male who presents for virtual visit regarding: For follow-up.  Patient was last seen in our office 12/15/2018 for alcohol abuse, history of colon polyps, history of peptic ulcer disease.  Previously diagnosed with atrial fibrillation in 2017 in [place], history of alcohol abuse and previous heavy drinker.  In October 2019 he was admitted to the hospital and [place], [place] for hematemesis and rectal bleeding.  Found to have gastritis and rectal polyp although no actual reports available.  Persistent mild anemia on labs including CBC dated 08/14/2017 for which she had a hemoglobin of 12.5.  Last visit he stated they found a polyp and a "growth" and recommended having it rechecked in February 2020.  Peptic ulcer disease on EGD.  Goes through withdrawal anytime he tries to quit drinking.  Recently relocated to [facility].  He was admitted to [facility] and underwent withdrawal during hospitalization for A. fib under police custody (DUI).  Currently drinks about 12-15 beers over the course of the whole day ("I drink morning tonight ") which he will do for about an entire week for 1 to 2 weeks each  month.  No other GI complaints.  CT dated 2014 in the current system found diffuse fatty liver.  Recommended updated labs and imaging, records requested from [place], [place], follow-up in 2 months at which point we can discuss any needed follow-up procedures.  Colonoscopy notes from [place], [place] received dated 07/30/2018.  Findings include polyp #1 measuring 20 x 8 mm in size in the ascending colon, polyp #2 measuring 30 x 18 mm in size located in the sigmoid: Status post tattooing.  Recommended repeat colonoscopy in 3 months.  Today he states he's doing well. Recently got a part time job at [employer] but left due to COVID-19/Coronavirus. Hasn't had any ETOH in 6-8 weeks. States he got a "wake-up call" when he got his ultrasound results from our office. Avoiding ETOH is still a struggle. Has struggle with ETOH addiction since his 20's. Denies abdominal pain, N/V, hematochezia, melena, fever, chills, unintentional weight loss. Denies yellowing of skin/eyes, acute episodic confusion, darkened urine, tremors/shakes. Denies chest pain, dyspnea, dizziness, lightheadedness, syncope, near syncope. Denies any other upper or lower GI symptoms.  Eliquis is managed by [employer] [facility] in [place], [place].  Past Medical History:  Diagnosis Date  . A-fib ([facility])   . Alcohol abuse   . Anxiety   . Depression   . Gout   . Hypertension   . Vertigo     Past Surgical History:  Procedure Laterality Date  . COLONOSCOPY    . HERNIA REPAIR      Current Outpatient Medications  Medication Sig Dispense  Refill  . apixaban (ELIQUIS) 5 MG TABS tablet Take 1 tablet (5 mg total) by mouth 2 (two) times daily. 60 tablet 6  . diltiazem (CARDIZEM CD) 180 MG 24 hr capsule Take 1 capsule (180 mg total) by mouth daily. 30 capsule 1  . famotidine (PEPCID) 40 MG tablet Take 1 tablet (40 mg total) by mouth 2 (two) times daily. 60 tablet 1  . INDOMETHACIN PO Take 100 mg by mouth as needed.    . metoprolol succinate (TOPROL-XL)  100 MG 24 hr tablet Take 1 tablet (100 mg total) by mouth daily. Take with or immediately following a meal. 30 tablet 1  . predniSONE (DELTASONE) 1 MG tablet Take 7 tablets by mouth daily. 7mg  total daily. Reducing by 1 dose monthly    . sildenafil (VIAGRA) 100 MG tablet Take 100 mg by mouth daily as needed for erectile dysfunction.    . sucralfate (CARAFATE) 1 g tablet Take 2 tablets (2 g total) by mouth 2 (two) times daily. 120 tablet 1  . valACYclovir (VALTREX) 1000 MG tablet Take 1,000 mg by mouth as needed.    . predniSONE (DELTASONE) 5 MG tablet Take 5 mg by mouth daily. 9mg  total daily     No current facility-administered medications for this visit.     Allergies as of 02/15/2019  . (No Known Allergies)    Family History  Problem Relation Age of Onset  . Cancer Mother   . Heart disease Father   . Heart disease Brother   . Heart disease Other   . Colon cancer Neg Hx   . Gastric cancer Neg Hx   . Esophageal cancer Neg Hx     Social History   Socioeconomic History  . Marital status: Single    Spouse name: Not on file  . Number of children: Not on file  . Years of education: Not on file  . Highest education level: Not on file  Occupational History  . Not on file  Social Needs  . Financial resource strain: Not on file  . Food insecurity:    Worry: Not on file    Inability: Not on file  . Transportation needs:    Medical: Not on file    Non-medical: Not on file  Tobacco Use  . Smoking status: Never Smoker  . Smokeless tobacco: Never Used  Substance and Sexual Activity  . Alcohol use: Not Currently    Comment:  "alcoholic all my life"; Currently drinks all day for 7 days straight 1-2 times (weeks) a month  . Drug use: No  . Sexual activity: Never  Lifestyle  . Physical activity:    Days per week: Not on file    Minutes per session: Not on file  . Stress: Not on file  Relationships  . Social connections:    Talks on phone: Not on file    Gets together: Not on  file    Attends religious service: Not on file    Active member of club or organization: Not on file    Attends meetings of clubs or organizations: Not on file    Relationship status: Not on file  Other Topics Concern  . Not on file  Social History Narrative  . Not on file    Review of Systems: Complete ROS negative except as per HPI.  Physical Exam: Note: limited exam due to virtual visit General:   Alert and oriented. Pleasant and cooperative. Eyes:  Without icterus, sclera clear and conjunctiva pink.  Ears:  Normal auditory acuity. Skin:  Intact without facial significant lesions or rashes. Neurologic:  Alert and oriented x4 Psych:  Alert and cooperative. Normal mood and affect.

## 2019-02-15 NOTE — Progress Notes (Signed)
CC'D TO PCP °

## 2019-02-15 NOTE — Patient Instructions (Signed)
Your health issues we discussed today were:   Cirrhosis/liver scarring: 1. Your labs and imaging are up-to-date. 2. We will always highly recommend that you continue your great efforts towards alcohol avoidance/sobriety. 3. Generally your liver disease looks like you are maintaining good liver function 4. We will recheck your labs and imaging at your follow-up appointment in 4 months 5. Call us if you have any new symptoms related to your liver  History of large colon polyps: 1. We will schedule your colonoscopy for you 2. Further recommendations will be made after your colonoscopy  Overall I recommend:  1. Call us if you have any questions or concerns 2. Continue your current medications 3. Follow-up in 4 months.   Because of recent events of COVID-19 ("Coronavirus"), follow CDC recommendations:  1. Wash your hand frequently 2. Avoid touching your face 3. Stay away from people who are sick 4. If you have symptoms such as fever, cough, shortness of breath then call your healthcare provider for further guidance 5. If you are sick, STAY AT HOME unless otherwise directed by your healthcare provider. 6. Follow directions from state and national officials regarding staying safe   At [facility] we value your feedback. You may receive a survey about your visit today. Please share your experience as we strive to create trusting relationships with our patients to provide genuine, compassionate, quality care.  We appreciate your understanding and patience as we review any laboratory studies, imaging, and other diagnostic tests that are ordered as we care for you. Our office policy is 5 business days for review of these results, and any emergent or urgent results are addressed in a timely manner for your best interest. If you do not hear from our office in 1 week, please contact us.   We also encourage the use of MyChart, which contains your medical information for your review  as well. If you are not enrolled in this feature, an access code is on this after visit summary for your convenience. Thank you for allowing [place] to be involved in your care.  It was great to see you today!  I hope you have a great day!!

## 2019-02-15 NOTE — Assessment & Plan Note (Addendum)
Cirrhosis confirmed by recent ultrasound elastography documenting F3/F4 with high risk of fibrosis/cirrhosis.  His labs show generally well compensated disease, platelet count normal at 180 decreasing the likelihood of portal hypertension.  His disease is related to alcoholism.  He is struggled with alcohol since his 89s but is currently been sober 6 to 8 weeks.  He states he became quite frightened when he was told he has liver scarring.  Recommend he continue his great efforts towards sobriety and alcohol avoidance.  Labs and imaging are up-to-date and next due in 4 months (September 2020).

## 2019-02-15 NOTE — Assessment & Plan Note (Signed)
History of colon polyps.  Received colonoscopy report from [place], [place] which was scanned into the procedures tab.  He had 2 quite large polyps as per HPI.  Recommended 33-monthrepeat.  He is currently overdue.  We will proceed with colonoscopy for surveillance of large polyp status post tattooing at this time.  Proceed with colonoscopy on propofol/MAC with Dr. FOneida Alarin the near future. The risks, benefits, and alternatives have been discussed in detail with the patient. They state understanding and desire to proceed.   The patient is currently on Eliquis.  No other anticoagulants, anxiolytics, chronic pain medications, or antidepressants.  History of alcoholism.  We will plan for the procedure on propofol/MAC to promote adequate sedation.  [facility] [place] his Eliquis for 48 hours prior to his procedure.

## 2019-02-16 ENCOUNTER — Telehealth: Payer: Self-pay

## 2019-02-16 NOTE — Telephone Encounter (Signed)
Dr Harl Bowie, It's time for pts Colonoscopy . Is it ok to hold Eliquis 48 hours prior to pts procedure? Please advise.

## 2019-02-17 NOTE — Telephone Encounter (Signed)
Routing message 

## 2019-02-17 NOTE — Telephone Encounter (Signed)
Yes, ok to hold eliquis as needed for colonoscopy   Zandra Abts MD

## 2019-02-17 NOTE — Telephone Encounter (Signed)
Noted, will call pt when [facility]'s August schedule is available.  Routing to EG as FYI.

## 2019-02-18 NOTE — Telephone Encounter (Signed)
Noted  

## 2019-02-19 ENCOUNTER — Ambulatory Visit: Payer: Medicare HMO | Admitting: Nurse Practitioner

## 2019-02-25 ENCOUNTER — Encounter (HOSPITAL_COMMUNITY): Payer: Self-pay | Admitting: Emergency Medicine

## 2019-02-25 ENCOUNTER — Other Ambulatory Visit: Payer: Self-pay

## 2019-02-25 ENCOUNTER — Inpatient Hospital Stay (HOSPITAL_COMMUNITY)
Admission: EM | Admit: 2019-02-25 | Discharge: 2019-02-28 | DRG: 392 | Disposition: A | Discharge status: Home / Self Care | Payer: Medicare HMO | Attending: Internal Medicine | Admitting: Internal Medicine

## 2019-02-25 ENCOUNTER — Emergency Department (HOSPITAL_COMMUNITY): Payer: Medicare HMO

## 2019-02-25 DIAGNOSIS — K920 Hematemesis: Secondary | ICD-10-CM | POA: Diagnosis not present

## 2019-02-25 DIAGNOSIS — Z20828 Contact with and (suspected) exposure to other viral communicable diseases: Secondary | ICD-10-CM | POA: Diagnosis not present

## 2019-02-25 DIAGNOSIS — I4819 Other persistent atrial fibrillation: Secondary | ICD-10-CM | POA: Diagnosis present

## 2019-02-25 DIAGNOSIS — Z8711 Personal history of peptic ulcer disease: Secondary | ICD-10-CM

## 2019-02-25 DIAGNOSIS — K210 Gastro-esophageal reflux disease with esophagitis: Secondary | ICD-10-CM | POA: Diagnosis present

## 2019-02-25 DIAGNOSIS — I4891 Unspecified atrial fibrillation: Secondary | ICD-10-CM | POA: Diagnosis not present

## 2019-02-25 DIAGNOSIS — K2951 Unspecified chronic gastritis with bleeding: Secondary | ICD-10-CM | POA: Diagnosis not present

## 2019-02-25 DIAGNOSIS — F1020 Alcohol dependence, uncomplicated: Secondary | ICD-10-CM

## 2019-02-25 DIAGNOSIS — T39395A Adverse effect of other nonsteroidal anti-inflammatory drugs [NSAID], initial encounter: Secondary | ICD-10-CM | POA: Diagnosis present

## 2019-02-25 DIAGNOSIS — K625 Hemorrhage of anus and rectum: Secondary | ICD-10-CM | POA: Diagnosis not present

## 2019-02-25 DIAGNOSIS — R195 Other fecal abnormalities: Secondary | ICD-10-CM | POA: Diagnosis not present

## 2019-02-25 DIAGNOSIS — R112 Nausea with vomiting, unspecified: Secondary | ICD-10-CM | POA: Diagnosis not present

## 2019-02-25 DIAGNOSIS — K2970 Gastritis, unspecified, without bleeding: Secondary | ICD-10-CM | POA: Diagnosis not present

## 2019-02-25 DIAGNOSIS — Z79899 Other long term (current) drug therapy: Secondary | ICD-10-CM | POA: Diagnosis not present

## 2019-02-25 DIAGNOSIS — K760 Fatty (change of) liver, not elsewhere classified: Secondary | ICD-10-CM | POA: Diagnosis present

## 2019-02-25 DIAGNOSIS — R531 Weakness: Secondary | ICD-10-CM | POA: Diagnosis not present

## 2019-02-25 DIAGNOSIS — R58 Hemorrhage, not elsewhere classified: Secondary | ICD-10-CM | POA: Diagnosis not present

## 2019-02-25 DIAGNOSIS — M10021 Idiopathic gout, right elbow: Secondary | ICD-10-CM | POA: Diagnosis present

## 2019-02-25 DIAGNOSIS — F419 Anxiety disorder, unspecified: Secondary | ICD-10-CM | POA: Diagnosis present

## 2019-02-25 DIAGNOSIS — I10 Essential (primary) hypertension: Secondary | ICD-10-CM | POA: Diagnosis present

## 2019-02-25 DIAGNOSIS — F1021 Alcohol dependence, in remission: Secondary | ICD-10-CM | POA: Diagnosis present

## 2019-02-25 DIAGNOSIS — K529 Noninfective gastroenteritis and colitis, unspecified: Secondary | ICD-10-CM | POA: Diagnosis not present

## 2019-02-25 DIAGNOSIS — Z86010 Personal history of colonic polyps: Secondary | ICD-10-CM

## 2019-02-25 DIAGNOSIS — R1110 Vomiting, unspecified: Secondary | ICD-10-CM | POA: Diagnosis not present

## 2019-02-25 DIAGNOSIS — R197 Diarrhea, unspecified: Secondary | ICD-10-CM | POA: Insufficient documentation

## 2019-02-25 DIAGNOSIS — K269 Duodenal ulcer, unspecified as acute or chronic, without hemorrhage or perforation: Secondary | ICD-10-CM | POA: Diagnosis not present

## 2019-02-25 DIAGNOSIS — R000 Tachycardia, unspecified: Secondary | ICD-10-CM | POA: Diagnosis not present

## 2019-02-25 DIAGNOSIS — R1013 Epigastric pain: Secondary | ICD-10-CM | POA: Diagnosis not present

## 2019-02-25 DIAGNOSIS — I471 Supraventricular tachycardia: Secondary | ICD-10-CM | POA: Diagnosis not present

## 2019-02-25 DIAGNOSIS — Z7901 Long term (current) use of anticoagulants: Secondary | ICD-10-CM

## 2019-02-25 DIAGNOSIS — K921 Melena: Secondary | ICD-10-CM | POA: Diagnosis not present

## 2019-02-25 DIAGNOSIS — M109 Gout, unspecified: Secondary | ICD-10-CM | POA: Diagnosis present

## 2019-02-25 LAB — COMPREHENSIVE METABOLIC PANEL
ALT: 18 U/L (ref 0–44)
AST: 17 U/L (ref 15–41)
Albumin: 3.8 g/dL (ref 3.5–5.0)
Alkaline Phosphatase: 43 U/L (ref 38–126)
Anion gap: 15 (ref 5–15)
BUN: 31 mg/dL — ABNORMAL HIGH (ref 8–23)
CO2: 22 mmol/L (ref 22–32)
Calcium: 9.1 mg/dL (ref 8.9–10.3)
Chloride: 97 mmol/L — ABNORMAL LOW (ref 98–111)
Creatinine, Ser: 1.13 mg/dL (ref 0.61–1.24)
GFR calc Af Amer: >60 mL/min (ref >60)
GFR calc non Af Amer: >60 mL/min (ref >60)
Glucose, Bld: 112 mg/dL — ABNORMAL HIGH (ref 70–99)
Potassium: 3.5 mmol/L (ref 3.5–5.1)
Sodium: 134 mmol/L — ABNORMAL LOW (ref 135–145)
Total Bilirubin: 1 mg/dL (ref 0.3–1.2)
Total Protein: 7.2 g/dL (ref 6.5–8.1)

## 2019-02-25 LAB — MRSA PCR SCREENING: MRSA by PCR: NEGATIVE

## 2019-02-25 LAB — CBC WITH DIFFERENTIAL/PLATELET
Abs Immature Granulocytes: 0.26 10*3/uL — ABNORMAL HIGH (ref 0.00–0.07)
Basophils Absolute: 0.1 10*3/uL (ref 0.0–0.1)
Basophils Relative: 1 %
Eosinophils Absolute: 0 10*3/uL (ref 0.0–0.5)
Eosinophils Relative: 0 %
HCT: 43 % (ref 39.0–52.0)
Hemoglobin: 15.1 g/dL (ref 13.0–17.0)
Immature Granulocytes: 3 %
Lymphocytes Relative: 23 %
Lymphs Abs: 2.3 10*3/uL (ref 0.7–4.0)
MCH: 32.9 pg (ref 26.0–34.0)
MCHC: 35.1 g/dL (ref 30.0–36.0)
MCV: 93.7 fL (ref 80.0–100.0)
Monocytes Absolute: 1.3 10*3/uL — ABNORMAL HIGH (ref 0.1–1.0)
Monocytes Relative: 13 %
Neutro Abs: 6.1 10*3/uL (ref 1.7–7.7)
Neutrophils Relative %: 60 %
Platelets: 253 10*3/uL (ref 150–400)
RBC: 4.59 MIL/uL (ref 4.22–5.81)
RDW: 13.9 % (ref 11.5–15.5)
WBC: 10.1 10*3/uL (ref 4.0–10.5)
nRBC: 0 % (ref 0.0–0.2)

## 2019-02-25 LAB — PROTIME-INR
INR: 1.2 (ref 0.8–1.2)
Prothrombin Time: 14.6 seconds (ref 11.4–15.2)

## 2019-02-25 LAB — TYPE AND SCREEN
ABO/RH(D): O POS
Antibody Screen: NEGATIVE

## 2019-02-25 LAB — LIPASE, BLOOD: Lipase: 53 U/L — ABNORMAL HIGH (ref 11–51)

## 2019-02-25 LAB — SARS CORONAVIRUS 2 BY RT PCR (HOSPITAL ORDER, PERFORMED IN ~~LOC~~ HOSPITAL LAB): SARS Coronavirus 2: NEGATIVE

## 2019-02-25 LAB — POC OCCULT BLOOD, ED: Fecal Occult Bld: POSITIVE — AB

## 2019-02-25 MED ORDER — LORAZEPAM 0.5 MG PO TABS
0.5000 mg | ORAL_TABLET | Freq: Four times a day (QID) | ORAL | Status: DC | PRN
  Administered 2019-02-25 – 2019-02-26 (×2): 0.5 mg via ORAL
  Filled 2019-02-25 (×2): qty 1

## 2019-02-25 MED ORDER — ALUM & MAG HYDROXIDE-SIMETH 200-200-20 MG/5ML PO SUSP
30.0000 mL | ORAL | Status: DC | PRN
  Administered 2019-02-25: 30 mL via ORAL
  Filled 2019-02-25: qty 30

## 2019-02-25 MED ORDER — SODIUM CHLORIDE 0.9 % IV BOLUS
500.0000 mL | Freq: Once | INTRAVENOUS | Status: AC
  Administered 2019-02-25: 500 mL via INTRAVENOUS

## 2019-02-25 MED ORDER — DILTIAZEM LOAD VIA INFUSION
15.0000 mg | Freq: Once | INTRAVENOUS | Status: AC
  Administered 2019-02-25: 15 mg via INTRAVENOUS
  Filled 2019-02-25: qty 15

## 2019-02-25 MED ORDER — IOHEXOL 300 MG/ML  SOLN
100.0000 mL | Freq: Once | INTRAMUSCULAR | Status: AC | PRN
  Administered 2019-02-25: 100 mL via INTRAVENOUS

## 2019-02-25 MED ORDER — ACETAMINOPHEN 325 MG PO TABS: 650.0000 mg | ORAL_TABLET | Freq: Four times a day (QID) | ORAL | Status: DC | PRN

## 2019-02-25 MED ORDER — LACTATED RINGERS IV SOLN
INTRAVENOUS | Status: DC
  Administered 2019-02-25 – 2019-02-26 (×4): via INTRAVENOUS

## 2019-02-25 MED ORDER — DILTIAZEM HCL 100 MG IV SOLR
5.0000 mg/h | INTRAVENOUS | Status: DC
  Administered 2019-02-25: 12 mg/h via INTRAVENOUS
  Administered 2019-02-25 – 2019-02-26 (×2): 5 mg/h via INTRAVENOUS
  Filled 2019-02-25 (×3): qty 100

## 2019-02-25 MED ORDER — DILTIAZEM HCL 25 MG/5ML IV SOLN
INTRAVENOUS | Status: AC
  Filled 2019-02-25: qty 5

## 2019-02-25 MED ORDER — ACETAMINOPHEN 650 MG RE SUPP: 650.0000 mg | Freq: Four times a day (QID) | RECTAL | Status: DC | PRN

## 2019-02-25 MED ORDER — METOPROLOL SUCCINATE ER 50 MG PO TB24
100.0000 mg | ORAL_TABLET | Freq: Every day | ORAL | Status: DC
  Administered 2019-02-25 – 2019-02-26 (×2): 100 mg via ORAL
  Filled 2019-02-25 (×2): qty 2

## 2019-02-25 MED ORDER — PANTOPRAZOLE SODIUM 40 MG IV SOLR
40.0000 mg | Freq: Once | INTRAVENOUS | Status: AC
  Administered 2019-02-25: 40 mg via INTRAVENOUS
  Filled 2019-02-25: qty 40

## 2019-02-25 MED ORDER — PANTOPRAZOLE SODIUM 40 MG IV SOLR
40.0000 mg | Freq: Two times a day (BID) | INTRAVENOUS | Status: DC
  Administered 2019-02-25 – 2019-02-26 (×2): 40 mg via INTRAVENOUS
  Filled 2019-02-25 (×2): qty 40

## 2019-02-25 MED ORDER — PIPERACILLIN-TAZOBACTAM 3.375 G IVPB 30 MIN: 3.3750 g | Freq: Once | INTRAVENOUS | Status: DC

## 2019-02-25 NOTE — H&P (Addendum)
History and Physical    Miguel Spencer UXL:[id number] DOB: 1951/05/19 DOA: 02/25/2019  PCP: [name], MD   Patient coming from: Home  Chief Complaint: N/V, rectal bleed, epigastric pain  HPI: Miguel Spencer is a 69 y.o. male with medical history significant for persistent atrial fibrillation on Eliquis, hypertension, colon polyps with prior colonoscopy 07/2018, gout, prior alcohol abuse, and questionable history of gastric ulcer who presented to the ED today with progression of GI symptoms that began this past Sunday.  He states that after having Janine Limbo on that day he began to have some nausea and vomiting that was noted to be dark with what sounds like coffee ground emesis.  He noted some persistent, dull, and achy epigastric abdominal pain which worsened with anything that he ate and therefore, he has not had anything to eat over the last 2 to 3 days.  He could barely tolerate any liquid intake until just yesterday when he was able to have some milk and ice water.  He states that he also developed some loose, liquidy stools that were quite dark as well.  He stopped taking all of his home medications to include Eliquis since Monday.  He can hardly tolerate any p.o. intake and continues to have epigastric discomfort and a sense of nausea.  He denies any chest pain, shortness of breath, lightheadedness, dizziness, or crampy abdominal pain.  He denies any tobacco abuse and any current alcohol use and states that he quit several months ago.  He does take ibuprofen as needed for random aches and pains, but not consistently.  He has apparently had a prior colonoscopy on 07/2018 with polyps noted and was due to have these repeated with Dr. Oneida Alar in September of this year.  He apparently has never had an EGD.   ED Course: Vital signs demonstrate some persistent tachycardia.  He has been started on a Cardizem drip as a result of noted supraventricular tachyarrhythmia.  He is also been started on some IV  fluid.  CT of the abdomen pelvis with contrast demonstrates what appears to be colitis of the cecum and ascending colon.  Fecal occult blood is noted to be positive.  Hemoglobin is 15.1 with prior hemoglobin level noted to be 14.2 on 3/20.  BUN is elevated at 31.  Review of Systems: He notes some palpitations, but otherwise as above with all others negative.  Past Medical History:  Diagnosis Date   A-fib [facility])    Alcohol abuse    Anxiety    Depression    Gout    Hypertension    Vertigo     Past Surgical History:  Procedure Laterality Date   COLONOSCOPY     HERNIA REPAIR       reports that he has never smoked. He has never used smokeless tobacco. He reports previous alcohol use. He reports that he does not use drugs.  No Known Allergies  Family History  Problem Relation Age of Onset   Cancer Mother    Heart disease Father    Heart disease Brother    Heart disease Other    Colon cancer Neg Hx    Gastric cancer Neg Hx    Esophageal cancer Neg Hx     Prior to Admission medications   Medication Sig Start Date End Date Taking? Authorizing Provider  apixaban (ELIQUIS) 5 MG TABS tablet Take 1 tablet (5 mg total) by mouth 2 (two) times daily. 01/04/19  Yes Branch, Alphonse Guild, MD  B Complex-Biotin-FA (B-COMPLEX PO) Take 1 tablet by mouth daily.   Yes [provider]  cyclobenzaprine (FLEXERIL) 10 MG tablet Take 10 mg by mouth at bedtime as needed for muscle spasms (for sleep).  01/16/19  Yes [provider]  diltiazem (CARDIZEM CD) 180 MG 24 hr capsule Take 1 capsule (180 mg total) by mouth daily. 08/15/18  Yes Tat, Shanon Brow, MD  famotidine (PEPCID) 40 MG tablet Take 1 tablet (40 mg total) by mouth 2 (two) times daily. 08/15/18  Yes Tat, Shanon Brow, MD  INDOMETHACIN PO Take 100 mg by mouth as needed (gout).    Yes [provider]  LORazepam (ATIVAN) 0.5 MG tablet Take 1.5 mg by mouth at bedtime as needed for anxiety.  01/19/19  Yes [provider]  LYSINE PO Take 1 capsule by mouth daily.   Yes [provider]  metoprolol succinate (TOPROL-XL) 100 MG 24 hr tablet Take 1 tablet (100 mg total) by mouth daily. Take with or immediately following a meal. 08/15/18  Yes Tat, David, MD  Omega-3 Fatty Acids (FISH OIL PO) Take 1 capsule by mouth daily.   Yes [provider]  predniSONE (DELTASONE) 1 MG tablet Take 6 mg by mouth daily. 7mg  total daily. Reducing by 1 dose monthly 11/26/18  Yes [provider]  sildenafil (VIAGRA) 100 MG tablet Take 100 mg by mouth daily as needed for erectile dysfunction.   Yes [provider]  sucralfate (CARAFATE) 1 g tablet Take 2 tablets (2 g total) by mouth 2 (two) times daily. Patient taking differently: Take 2 g by mouth daily.  08/15/18  Yes Tat, Shanon Brow, MD  valACYclovir (VALTREX) 1000 MG tablet Take 1,000 mg by mouth as needed (cold sores).    Yes [provider]    Physical Exam: Vitals:   02/25/19 1100 02/25/19 1115 02/25/19 1130 02/25/19 1153  BP: (!) 132/111  113/85 (!) 116/91  Pulse:      Resp: (!) 21 (!) 22 (!) 23 16  Temp:      TempSrc:      SpO2: 99% 97% 99% 100%  Weight:      Height:        Constitutional: NAD, calm, comfortable Vitals:   02/25/19 1100 02/25/19 1115 02/25/19 1130 02/25/19 1153  BP: (!) 132/111  113/85 (!) 116/91  Pulse:      Resp: (!) 21 (!) 22 (!) 23 16  Temp:      TempSrc:      SpO2: 99% 97% 99% 100%  Weight:      Height:       Eyes: lids and conjunctivae normal ENMT: Mucous membranes are moist.  Neck: normal, supple Respiratory: clear to auscultation bilaterally. Normal respiratory effort. No accessory muscle use.  Cardiovascular: Regular rate and rhythm, no murmurs. No extremity edema. Abdomen: no tenderness, no distention. Bowel sounds positive.  Musculoskeletal:  No joint deformity upper and lower extremities.   Skin: no rashes, lesions, ulcers.  Psychiatric: Normal judgment and insight. Alert and oriented x 3.  Normal mood.   Labs on Admission: I have personally reviewed following labs and imaging studies  CBC: Recent Labs  Lab 02/25/19 0920  WBC 10.1  NEUTROABS 6.1  HGB 15.1  HCT 43.0  MCV 93.7  PLT 130   Basic Metabolic Panel: Recent Labs  Lab 02/25/19 0920  NA 134*  K 3.5  CL 97*  CO2 22  GLUCOSE 112*  BUN 31*  CREATININE 1.13  CALCIUM 9.1   GFR:  Estimated Creatinine Clearance: 69.6 mL/min (by C-G formula based on SCr of 1.13 mg/dL). Liver Function Tests: Recent Labs  Lab 02/25/19 0920  AST 17  ALT 18  ALKPHOS 43  BILITOT 1.0  PROT 7.2  ALBUMIN 3.8   Recent Labs  Lab 02/25/19 0920  LIPASE 53*   No results for input(s): AMMONIA in the last 168 hours. Coagulation Profile: Recent Labs  Lab 02/25/19 0920  INR 1.2   Cardiac Enzymes: No results for input(s): CKTOTAL, CKMB, CKMBINDEX, TROPONINI in the last 168 hours. BNP (last 3 results) No results for input(s): PROBNP in the last 8760 hours. HbA1C: No results for input(s): HGBA1C in the last 72 hours. CBG: No results for input(s): GLUCAP in the last 168 hours. Lipid Profile: No results for input(s): CHOL, HDL, LDLCALC, TRIG, CHOLHDL, LDLDIRECT in the last 72 hours. Thyroid Function Tests: No results for input(s): TSH, T4TOTAL, FREET4, T3FREE, THYROIDAB in the last 72 hours. Anemia Panel: No results for input(s): VITAMINB12, FOLATE, FERRITIN, TIBC, IRON, RETICCTPCT in the last 72 hours. Urine analysis:    Component Value Date/Time   COLORURINE YELLOW 02/20/2013 2236   APPEARANCEUR CLEAR 02/20/2013 2236   LABSPEC 1.025 02/20/2013 2236   PHURINE 6.0 02/20/2013 2236   GLUCOSEU NEGATIVE 02/20/2013 2236   HGBUR MODERATE (A) 02/20/2013 2236   BILIRUBINUR NEGATIVE 02/20/2013 2236   KETONESUR TRACE (A) 02/20/2013 2236   PROTEINUR NEGATIVE 02/20/2013 2236   UROBILINOGEN 0.2 02/20/2013 2236   NITRITE NEGATIVE 02/20/2013 2236   LEUKOCYTESUR NEGATIVE 02/20/2013 2236    Radiological Exams on Admission: Ct  Abdomen Pelvis W Contrast  Result Date: 02/25/2019 CLINICAL DATA:  Rectal bleeding with nausea and vomiting for the past 4 days. History of gastric ulcer and alcohol abuse. EXAM: CT ABDOMEN AND PELVIS WITH CONTRAST TECHNIQUE: Multidetector CT imaging of the abdomen and pelvis was performed using the standard protocol following bolus administration of intravenous contrast. CONTRAST:  165mL OMNIPAQUE IOHEXOL 300 MG/ML  SOLN COMPARISON:  Abdominal ultrasound dated December 23, 2018. CT abdomen pelvis dated January 20, 2013. FINDINGS: Lower chest: No acute abnormality. Hepatobiliary: No focal liver abnormality is seen. No gallstones, gallbladder wall thickening, or biliary dilatation. Pancreas: Unremarkable. No pancreatic ductal dilatation or surrounding inflammatory changes. Spleen: Normal in size without focal abnormality. Adrenals/Urinary Tract: Adrenal glands are unremarkable. Kidneys are normal, without renal calculi, focal lesion, or hydronephrosis. Bladder is unremarkable for the degree of distention. Stomach/Bowel: Moderate circumferential wall thickening of the cecum and ascending colon with mild surrounding inflammatory stranding. The remaining colon is unremarkable. The stomach and small bowel are within normal limits. No obstruction. Normal appendix. Vascular/Lymphatic: Aortic atherosclerosis. No enlarged abdominal or pelvic lymph nodes. Reproductive: Prostate is unremarkable. Other: Unchanged small fat containing umbilical hernia. No free fluid or pneumoperitoneum. Musculoskeletal: No acute or significant osseous findings. Prominent Schmorl's nodes involving the T10 and T11 superior endplates. IMPRESSION: 1. Acute colitis involving the cecum and ascending colon. 2.  Aortic atherosclerosis (ICD10-I70.0). Electronically Signed   By: Titus Dubin M.D.   On: 02/25/2019 11:52    EKG: Independently reviewed. 179 SVT.  Assessment/Plan Active Problems:   Atrial fibrillation with rapid ventricular response  (HCC)   Hypertension   Gout   History of peptic ulcer disease   Colitis    Acute GI bleed -Monitor repeat CBC in a.m. -Transfuse for hemoglobin less than 7 -Currently with stable hemoglobin levels, however this could reflect some hemoconcentration -Consult to GI for endoscopy -N.p.o. for now -PPI twice daily and avoid heparin agents  and hold Eliquis  Atrial fibrillation with RVR -Maintain on Cardizem drip and monitor for rate control -We will plan to add back home metoprolol to assist with rate control  Colitis -Check viral GI pathogen panel and hold initiation of antibiotics for now -No significant leukocytosis currently noted or abdominal tenderness  GERD/?  PUD -Hold Carafate and Pepcid for now and placed on PPI twice daily as noted above  Gout -Currently asymptomatic, will monitor  Hypertension -Currently stable and well-controlled; monitor carefully on Cardizem drip -Restart beta-blocker   DVT prophylaxis: SCDs Code Status: Full Family Communication: None at bedside Disposition Plan:Admit for GI bleed evaluation, HR control Consults called:GI Admission status: Inpatient, SDU   Mayci Haning D Manuella Ghazi DO [facility] Pager [phone]  If 7PM-7AM, please contact night-coverage [email or web address] Password [facility]  02/25/2019, 12:46 PM

## 2019-02-25 NOTE — ED Notes (Signed)
ED Provider at bedside. Notified of continued elevated heartrate

## 2019-02-25 NOTE — ED Notes (Signed)
ED TO INPATIENT HANDOFF REPORT  ED Nurse Name and Phone #: Clayton Bibles [phone]  S Name/Age/Gender Miguel Spencer 68 y.o. male Room/Bed: APA08/APA08  Code Status   Code Status: Full Code  Home/SNF/Other Home Patient oriented to: self, place, time and situation Is this baseline? Yes   Triage Complete: Triage complete  Chief Complaint Bleeding Ulcer  Triage Note Dark red blood from rectum since Sunday night.  Pt reports this started after he vomited x 1 on Sunday.  Has not been able to eat since Sunday.     Allergies No Known Allergies  Level of Care/Admitting Diagnosis ED Disposition    ED Disposition Condition [facility] Area: [facility] [[id number]]  Level of Care: Stepdown [14]  Covid Evaluation: N/A  Diagnosis: Colitis [[id number]]  Admitting Physician: Rodena Goldmann [[id number]]  Attending Physician: Rodena Goldmann [[id number]]  Estimated length of stay: past midnight tomorrow  Certification:: I certify this patient will need inpatient services for at least 2 midnights  PT Class (Do Not Modify): Inpatient [101]  PT Acc Code (Do Not Modify): Private [1]       B Medical/Surgery History Past Medical History:  Diagnosis Date  . A-fib ([place])   . Alcohol abuse   . Anxiety   . Depression   . Gout   . Hypertension   . Vertigo    Past Surgical History:  Procedure Laterality Date  . COLONOSCOPY    . HERNIA REPAIR       A IV Location/Drains/Wounds Patient Lines/Drains/Airways Status   Active Line/Drains/Airways    Name:   Placement date:   Placement time:   Site:   Days:   Peripheral IV 02/25/19 Right Antecubital   02/25/19    0923    Antecubital   less than 1   Peripheral IV 02/25/19 Right Wrist   02/25/19    1100    Wrist   less than 1          Intake/Output Last 24 hours No intake or output data in the 24 hours ending 02/25/19 1318  Labs/Imaging Results for orders placed or performed during the hospital encounter of 02/25/19  (from the past 48 hour(s))  Comprehensive metabolic panel     Status: Abnormal   Collection Time: 02/25/19  9:20 AM  Result Value Ref Range   Sodium 134 (L) 135 - 145 mmol/L   Potassium 3.5 3.5 - 5.1 mmol/L   Chloride 97 (L) 98 - 111 mmol/L   CO2 22 22 - 32 mmol/L   Glucose, Bld 112 (H) 70 - 99 mg/dL   BUN 31 (H) 8 - 23 mg/dL   Creatinine, Ser 1.13 0.61 - 1.24 mg/dL   Calcium 9.1 8.9 - 10.3 mg/dL   Total Protein 7.2 6.5 - 8.1 g/dL   Albumin 3.8 3.5 - 5.0 g/dL   AST 17 15 - 41 U/L   ALT 18 0 - 44 U/L   Alkaline Phosphatase 43 38 - 126 U/L   Total Bilirubin 1.0 0.3 - 1.2 mg/dL   GFR calc non Af Amer >60 >60 mL/min   GFR calc Af Amer >60 >60 mL/min   Anion gap 15 5 - 15    Comment: Performed at [facility], [address], [place], [place] [address]  Lipase, blood     Status: Abnormal   Collection Time: 02/25/19  9:20 AM  Result Value Ref Range   Lipase 53 (H) 11 - 51 U/L  Comment: Performed at [facility], [address] [place], [place] [address]  CBC with Differential     Status: Abnormal   Collection Time: 02/25/19  9:20 AM  Result Value Ref Range   WBC 10.1 4.0 - 10.5 K/uL   RBC 4.59 4.22 - 5.81 MIL/uL   Hemoglobin 15.1 13.0 - 17.0 g/dL   HCT 43.0 39.0 - 52.0 %   MCV 93.7 80.0 - 100.0 fL   MCH 32.9 26.0 - 34.0 pg   MCHC 35.1 30.0 - 36.0 g/dL   RDW 13.9 11.5 - 15.5 %   Platelets 253 150 - 400 K/uL   nRBC 0.0 0.0 - 0.2 %   Neutrophils Relative % 60 %   Neutro Abs 6.1 1.7 - 7.7 K/uL   Lymphocytes Relative 23 %   Lymphs Abs 2.3 0.7 - 4.0 K/uL   Monocytes Relative 13 %   Monocytes Absolute 1.3 (H) 0.1 - 1.0 K/uL   Eosinophils Relative 0 %   Eosinophils Absolute 0.0 0.0 - 0.5 K/uL   Basophils Relative 1 %   Basophils Absolute 0.1 0.0 - 0.1 K/uL   Immature Granulocytes 3 %   Abs Immature Granulocytes 0.26 (H) 0.00 - 0.07 K/uL    Comment: Performed at [facility], [address] [place], [place] [address]  Protime-INR     Status: None   Collection Time: 02/25/19   9:20 AM  Result Value Ref Range   Prothrombin Time 14.6 11.4 - 15.2 seconds   INR 1.2 0.8 - 1.2    Comment: (NOTE) INR goal varies based on device and disease states. Performed at [facility], [address] [place], [place] [address]   POC occult blood, ED Provider will collect     Status: Abnormal   Collection Time: 02/25/19  9:20 AM  Result Value Ref Range   Fecal Occult Bld POSITIVE (A) NEGATIVE  Type and screen [facility]     Status: None   Collection Time: 02/25/19  9:21 AM  Result Value Ref Range   ABO/RH(D) O POS    Antibody Screen NEG    Sample Expiration      02/28/2019,2359 Performed at [facility], [address], [place], [place] [address]    Ct Abdomen Pelvis W Contrast  Result Date: 02/25/2019 CLINICAL DATA:  Rectal bleeding with nausea and vomiting for the past 4 days. History of gastric ulcer and alcohol abuse. EXAM: CT ABDOMEN AND PELVIS WITH CONTRAST TECHNIQUE: Multidetector CT imaging of the abdomen and pelvis was performed using the standard protocol following bolus administration of intravenous contrast. CONTRAST:  137mL OMNIPAQUE IOHEXOL 300 MG/ML  SOLN COMPARISON:  Abdominal ultrasound dated December 23, 2018. CT abdomen pelvis dated January 20, 2013. FINDINGS: Lower chest: No acute abnormality. Hepatobiliary: No focal liver abnormality is seen. No gallstones, gallbladder wall thickening, or biliary dilatation. Pancreas: Unremarkable. No pancreatic ductal dilatation or surrounding inflammatory changes. Spleen: Normal in size without focal abnormality. Adrenals/Urinary Tract: Adrenal glands are unremarkable. Kidneys are normal, without renal calculi, focal lesion, or hydronephrosis. Bladder is unremarkable for the degree of distention. Stomach/Bowel: Moderate circumferential wall thickening of the cecum and ascending colon with mild surrounding inflammatory stranding. The remaining colon is unremarkable. The stomach and small bowel are within normal limits. No  obstruction. Normal appendix. Vascular/Lymphatic: Aortic atherosclerosis. No enlarged abdominal or pelvic lymph nodes. Reproductive: Prostate is unremarkable. Other: Unchanged small fat containing umbilical hernia. No free fluid or pneumoperitoneum. Musculoskeletal: No acute or significant osseous findings. Prominent Schmorl's nodes  involving the T10 and T11 superior endplates. IMPRESSION: 1. Acute colitis involving the cecum and ascending colon. 2.  Aortic atherosclerosis (ICD10-I70.0). Electronically Signed   By: Titus Dubin M.D.   On: 02/25/2019 11:52    Pending Labs Unresulted Labs (From admission, onward)    Start     Ordered   02/26/19 0500  Comprehensive metabolic panel  Tomorrow morning,   R     02/25/19 1258   02/26/19 0500  CBC  Tomorrow morning,   R     02/25/19 1258   02/25/19 1301  Gastrointestinal Panel by PCR , Stool  (Gastrointestinal Panel by PCR, Stool)  Once,   R     02/25/19 1300   02/25/19 1215  SARS Coronavirus 2 (CEPHEID - Performed in [facility]), Hosp Order  (Asymptomatic Patients Labs)  ONCE - STAT,   R    Question:  Rule Out  Answer:  Yes   02/25/19 1214   02/25/19 0913  Urinalysis, Routine w reflex microscopic  ONCE - STAT,   STAT     02/25/19 0913   02/25/19 0913  Urine culture  ONCE - STAT,   STAT    Question:  Patient immune status  Answer:  Normal   02/25/19 0913          Vitals/Pain Today's Vitals   02/25/19 1130 02/25/19 1153 02/25/19 1230 02/25/19 1300  BP: 113/85 (!) 116/91 (!) 127/100 126/88  Pulse:      Resp: (!) 23 16 19 17   Temp:      TempSrc:      SpO2: 99% 100% 99%   Weight:      Height:      PainSc:        Isolation Precautions Enteric precautions (UV disinfection)  Medications Medications  diltiazem (CARDIZEM) 1 mg/mL load via infusion 15 mg (15 mg Intravenous Bolus from Bag 02/25/19 0945)    And  diltiazem (CARDIZEM) 100 mg in dextrose 5 % 100 mL (1 mg/mL) infusion (11.5 mg/hr Intravenous Rate/Dose Change  02/25/19 1247)  lactated ringers infusion ( Intravenous New Bag/Given 02/25/19 1219)  metoprolol succinate (TOPROL-XL) 24 hr tablet 100 mg (has no administration in time range)  acetaminophen (TYLENOL) tablet 650 mg (has no administration in time range)    Or  acetaminophen (TYLENOL) suppository 650 mg (has no administration in time range)  pantoprazole (PROTONIX) injection 40 mg (has no administration in time range)  sodium chloride 0.9 % bolus 500 mL (0 mLs Intravenous Stopped 02/25/19 1122)  pantoprazole (PROTONIX) injection 40 mg (40 mg Intravenous Given 02/25/19 0939)  iohexol (OMNIPAQUE) 300 MG/ML solution 100 mL (100 mLs Intravenous Contrast Given 02/25/19 1125)    Mobility walks Low fall risk   Focused Assessments    R Recommendations: See Admitting Provider Note  Report given to:   Additional Notes:

## 2019-02-25 NOTE — ED Provider Notes (Signed)
Emergency Department Provider Note   I have reviewed the triage vital signs and the nursing notes.   HISTORY  Chief Complaint Rectal Bleeding   HPI Miguel Spencer is a 68 y.o. male with PMH of A-fib, alcohol abuse, HTN, and gastric ulcer presents to the ED with rectal bleeding, diarrhea, and nausea. Patient reports 3 days of symptoms after eating at [facility]. He has known history of gastric ulcer and colon mass.  He is followed by GI locally with plan for lower endoscopy in September.  Patient states he initially had nausea vomiting with some "coffee" type material.  He has had ulcer bleeding in the past and states this seems similar.  He stopped taking all medications and did not feel like eating for the past 3 days.  He is on Eliquis for his A. fib but also stopped this medication he days ago.  He states that his diarrhea was severe early in the course of illness with frequent dark red blood.  His diarrhea has improved but continues to have sharp abdominal pains with epigastric burning discomfort.  No radiation of symptoms or other modifying factors.    Past Medical History:  Diagnosis Date  . A-fib ([place])   . Alcohol abuse   . Anxiety   . Depression   . Gout   . Hypertension   . Vertigo     Patient Active Problem List   Diagnosis Date Noted  . Colitis 02/25/2019  . Hepatic cirrhosis ([place]) 02/15/2019  . History of colonic polyps 12/15/2018  . History of peptic ulcer disease 12/15/2018  . Atrial fibrillation with rapid ventricular response ([place]) 08/13/2018  . Depression 08/13/2018  . Anxiety 08/13/2018  . Hypertension 08/13/2018  . Gout 08/13/2018  . Anemia 08/13/2018  . Alcohol abuse 08/13/2018  . Elevated lipase 08/13/2018  . Prolonged QT interval 08/13/2018  . Alcohol withdrawal ([place]) 08/13/2018    Past Surgical History:  Procedure Laterality Date  . COLONOSCOPY    . HERNIA REPAIR      Allergies Patient has no known allergies.  Family History  Problem  Relation Age of Onset  . Cancer Mother   . Heart disease Father   . Heart disease Brother   . Heart disease Other   . Colon cancer Neg Hx   . Gastric cancer Neg Hx   . Esophageal cancer Neg Hx     Social History Social History   Tobacco Use  . Smoking status: Never Smoker  . Smokeless tobacco: Never Used  Substance Use Topics  . Alcohol use: Not Currently    Comment: No ETOH 6-8 weeks (as of 02/15/19). "alcoholic all my life"; Currently drinks all day for 7 days straight 1-2 times (weeks) a month  . Drug use: No    Review of Systems  Constitutional: No fever/chills Eyes: No visual changes. ENT: No sore throat. Cardiovascular: Denies chest pain. Respiratory: Denies shortness of breath. Gastrointestinal: Positive abdominal pain.  Positive nausea/vomiting (resolved) Positive diarrhea.  No constipation. Positive blood in the BMs.  Genitourinary: Negative for dysuria. Musculoskeletal: Negative for back pain. Skin: Negative for rash. Neurological: Negative for headaches, focal weakness or numbness.  10-point ROS otherwise negative.  ____________________________________________   PHYSICAL EXAM:  VITAL SIGNS: ED Triage Vitals  Enc Vitals Group     BP 02/25/19 0854 116/84     Pulse Rate 02/25/19 0854 75     Resp 02/25/19 0854 20     Temp 02/25/19 0854 97.6 F (36.4 C)  Temp Source 02/25/19 0854 Oral     SpO2 02/25/19 0854 100 %     Weight 02/25/19 0854 195 lb (88.5 kg)     Height 02/25/19 0854 6' (1.829 m)     Pain Score 02/25/19 0852 8   Constitutional: Alert and oriented. Well appearing and in no acute distress. Eyes: Conjunctivae are normal. Head: Atraumatic. Nose: No congestion/rhinnorhea. Mouth/Throat: Mucous membranes are moist.  Neck: No stridor.   Cardiovascular: Normal rate, regular rhythm. Good peripheral circulation. Grossly normal heart sounds.   Respiratory: Normal respiratory effort.  No retractions. Lungs CTAB. Gastrointestinal: Soft with  diffuse tenderness. No rebound or guarding. No distention. Rectal exam performed with patient verbal consent and chaperone. No gross blood or melena. Hemoccult positive.  Musculoskeletal: No lower extremity tenderness nor edema. No gross deformities of extremities. Neurologic:  Normal speech and language. No gross focal neurologic deficits are appreciated.  Skin:  Skin is warm, dry and intact. No rash noted.  ____________________________________________   LABS (all labs ordered are listed, but only abnormal results are displayed)  Labs Reviewed  COMPREHENSIVE METABOLIC PANEL - Abnormal; Notable for the following components:      Result Value   Sodium 134 (*)    Chloride 97 (*)    Glucose, Bld 112 (*)    BUN 31 (*)    All other components within normal limits  LIPASE, BLOOD - Abnormal; Notable for the following components:   Lipase 53 (*)    All other components within normal limits  CBC WITH DIFFERENTIAL/PLATELET - Abnormal; Notable for the following components:   Monocytes Absolute 1.3 (*)    Abs Immature Granulocytes 0.26 (*)    All other components within normal limits  POC OCCULT BLOOD, ED - Abnormal; Notable for the following components:   Fecal Occult Bld POSITIVE (*)    All other components within normal limits  SARS CORONAVIRUS 2 (HOSPITAL ORDER, [place] [facility])  URINE CULTURE  GASTROINTESTINAL PANEL BY PCR, STOOL (REPLACES STOOL CULTURE)  PROTIME-INR  URINALYSIS, ROUTINE W REFLEX MICROSCOPIC  TYPE AND SCREEN   ____________________________________________  EKG   EKG Interpretation  Date/Time:  Thursday Feb 25 2019 09:21:09 EDT Ventricular Rate:  179 PR Interval:    QRS Duration: 87 QT Interval:  284 QTC Calculation: 491 R Axis:   28 Text Interpretation:  Supraventricular tachycardia No STEMI.  Confirmed by Nanda Quinton [phone]) on 02/25/2019 9:28:42 AM       ____________________________________________  RADIOLOGY  Ct Abdomen Pelvis  W Contrast  Result Date: 02/25/2019 CLINICAL DATA:  Rectal bleeding with nausea and vomiting for the past 4 days. History of gastric ulcer and alcohol abuse. EXAM: CT ABDOMEN AND PELVIS WITH CONTRAST TECHNIQUE: Multidetector CT imaging of the abdomen and pelvis was performed using the standard protocol following bolus administration of intravenous contrast. CONTRAST:  159mL OMNIPAQUE IOHEXOL 300 MG/ML  SOLN COMPARISON:  Abdominal ultrasound dated December 23, 2018. CT abdomen pelvis dated January 20, 2013. FINDINGS: Lower chest: No acute abnormality. Hepatobiliary: No focal liver abnormality is seen. No gallstones, gallbladder wall thickening, or biliary dilatation. Pancreas: Unremarkable. No pancreatic ductal dilatation or surrounding inflammatory changes. Spleen: Normal in size without focal abnormality. Adrenals/Urinary Tract: Adrenal glands are unremarkable. Kidneys are normal, without renal calculi, focal lesion, or hydronephrosis. Bladder is unremarkable for the degree of distention. Stomach/Bowel: Moderate circumferential wall thickening of the cecum and ascending colon with mild surrounding inflammatory stranding. The remaining colon is unremarkable. The stomach and small bowel are within  normal limits. No obstruction. Normal appendix. Vascular/Lymphatic: Aortic atherosclerosis. No enlarged abdominal or pelvic lymph nodes. Reproductive: Prostate is unremarkable. Other: Unchanged small fat containing umbilical hernia. No free fluid or pneumoperitoneum. Musculoskeletal: No acute or significant osseous findings. Prominent Schmorl's nodes involving the T10 and T11 superior endplates. IMPRESSION: 1. Acute colitis involving the cecum and ascending colon. 2.  Aortic atherosclerosis (ICD10-I70.0). Electronically Signed   By: Titus Dubin M.D.   On: 02/25/2019 11:52    ____________________________________________   PROCEDURES  Procedure(s) performed:   Procedures  CRITICAL CARE Performed by: Margette Fast Total critical care time: 35 minutes Critical care time was exclusive of separately billable procedures and treating other patients. Critical care was necessary to treat or prevent imminent or life-threatening deterioration. Critical care was time spent personally by me on the following activities: development of treatment plan with patient and/or surrogate as well as nursing, discussions with consultants, evaluation of patient's response to treatment, examination of patient, obtaining history from patient or surrogate, ordering and performing treatments and interventions, ordering and review of laboratory studies, ordering and review of radiographic studies, pulse oximetry and re-evaluation of patient's condition.  Nanda Quinton, MD Emergency Medicine  ____________________________________________   INITIAL IMPRESSION / ASSESSMENT AND PLAN / ED COURSE  Pertinent labs & imaging results that were available during my care of the patient were reviewed by me and considered in my medical decision making (see chart for details).   Patient presents to the emergency department with rectal bleeding.  He is Hemoccult positive here but no gross blood or melena.  Patient has been off all medications for the past 3 days including Eliquis and his atrial fibrillation medication.  His heart rate on EKG is 179.  Will start diltiazem infusion along with Protonix.  QT within normal limits.  Patient is hemodynamically stable.  He has diffuse abdominal tenderness.  Plan for CT abdomen pelvis.  Patient was seen earlier this month by Walden Field, NP is a colonoscopy scheduled in September with Dr. Oneida Alar.   CT shows evidence of colitis.  No bowel obstruction or perforated viscus.  Patient continues to have A. fib with RVR.  Having to titrate up diltiazem infusion.  Plan for bowel rest and IV fluids. Abx per primary team. Labs reviewed. No anemia. Plan for admit for rate control and bowel rest.   Discussed patient's  case with Hospitalist to request admission. Patient and family (if present) updated with plan. Care transferred to Hospitalist service.  I reviewed all nursing notes, vitals, pertinent old records, EKGs, labs, imaging (as available).  ____________________________________________  FINAL CLINICAL IMPRESSION(S) / ED DIAGNOSES  Final diagnoses:  Atrial fibrillation with RVR (HCC)  Rectal bleeding  Colitis     MEDICATIONS GIVEN DURING THIS VISIT:  Medications  diltiazem (CARDIZEM) 1 mg/mL load via infusion 15 mg (15 mg Intravenous Bolus from Bag 02/25/19 0945)    And  diltiazem (CARDIZEM) 100 mg in dextrose 5 % 100 mL (1 mg/mL) infusion (11.5 mg/hr Intravenous Rate/Dose Change 02/25/19 1247)  lactated ringers infusion ( Intravenous New Bag/Given 02/25/19 1219)  metoprolol succinate (TOPROL-XL) 24 hr tablet 100 mg (has no administration in time range)  acetaminophen (TYLENOL) tablet 650 mg (has no administration in time range)    Or  acetaminophen (TYLENOL) suppository 650 mg (has no administration in time range)  pantoprazole (PROTONIX) injection 40 mg (has no administration in time range)  sodium chloride 0.9 % bolus 500 mL (0 mLs Intravenous Stopped 02/25/19 1122)  pantoprazole (PROTONIX)  injection 40 mg (40 mg Intravenous Given 02/25/19 0939)  iohexol (OMNIPAQUE) 300 MG/ML solution 100 mL (100 mLs Intravenous Contrast Given 02/25/19 1125)    Note:  This document was prepared using Dragon voice recognition software and may include unintentional dictation errors.  Nanda Quinton, MD Emergency Medicine    , [facility], MD 02/25/19 1357

## 2019-02-25 NOTE — Consult Note (Signed)
Referring Provider: No ref. provider found Primary Care Physician:  [name], MD Primary Gastroenterologist:  Dr. Oneida Alar  Date of Admission: 02/25/19 Date of Consultation: 02/25/19  Reason for Consultation:  GI Bleed, abnormal CT colon  HPI:  Miguel Spencer is a 68 y.o. male with a past medical history of A. fib, alcohol abuse, depression, colon polyps, peptic ulcer disease.  Patient was last seen in our office 02/15/2019.  History of alcohol abuse and heavy drinker although at his last office visit he noted he had stopped drinking alcohol.  Persistent mild anemia in [place], [place] in 2018 and 2019.  Recently relocated to [facility].  Previously noted anytime he stops drinking he goes through withdrawal.  CT dated 2014 with diffuse fatty liver.    Colonoscopy notes from [place], [place] dated 07/30/2018.Findings include polyp #1 measuring 20 x 8 mm in size in the ascending colon, polyp #2 measuring 30 x 18 mm in size located in the sigmoid: Status post tattooing.  Recommended repeat colonoscopy in 3 months.  He did note he had stomach ulcers on EGD also in [place], [place].  At his last visit he noted he had not had any alcohol in 6 to 8 weeks, still struggles with alcohol addiction.  He has been an alcoholic since his 18E.  No overt GI symptoms.  We recommended continued alcohol abstinence, schedule colonoscopy based on previous recommendations from [place], follow-up in 4 months.  Per ER physician notes the patient was admitted for 3 days of rectal bleeding, diarrhea, nausea after eating at [facility].  Known gastric ulcer and colon polyp previously.  He noted coffee-ground emesis with nausea and vomiting.  He stopped taking his medications including Eliquis about 3 days ago.  Diarrhea improved.  He did note dark stools as well.  Continued with epigastric burning and discomfort.  Hemoccult positive but no obvious gross blood or melena on rectal exam.  Heart rate initially  179 on EKG and was started on Cardizem.  Also started on Protonix.  He notes he has not had much to eat over the past 2 to 3 days and yesterday was able to have some milk and ice water.  Labs in ED found hemoglobin of 15.1, previously was 14.2.  BUN is elevated at 31.  CT of the abdomen and pelvis found active colitis with mild circumferential wall thickening of the cecum and ascending colon.  He was admitted with recommended transfusion for hemoglobin less than 7, monitor CBC, n.p.o., consult GI for possible EGD.  Today states he was told in [place] he had peptic ulcer disease.  He does not remember if he specifically had an EGD, although he states he was in the ICU for 7 days and does not remember a lot of things.  On Sunday (approximately 4 days ago) he went to [employer].  Later that evening he woke up really nauseous and vomited.  His emesis was dark black and like coffee grounds.  He has not had any vomiting since.  He does have persistent nausea.  He began having abdominal pain specifically epigastric burning.  He began having diarrhea on Monday and had multiple stools.  He had a decreased appetite and he has not had much to eat in the past 3 to 4 days and subsequently has not had much diarrhea in the past 1 or 2 days.  When he did have bowel movements they were very dark/black.  He denies out right any bright red  rectal bleeding.  He has had some weakness and fatigue.  He has not had Eliquis in about 3 days.  He has not had solid foods in the past 3 to 4 days.  He did have ice water and a glass of milk yesterday afternoon.  No other GI complaints  Previously noted to be an alcoholic since his 21N.  He has not had any alcohol in about 2 to 3 months.  Past Medical History:  Diagnosis Date  . A-fib ([place])   . Alcohol abuse   . Anxiety   . Depression   . Gout   . Hypertension   . Vertigo     Past Surgical History:  Procedure Laterality Date  . COLONOSCOPY    . HERNIA REPAIR      Prior to  Admission medications   Medication Sig Start Date End Date Taking? Authorizing Provider  apixaban (ELIQUIS) 5 MG TABS tablet Take 1 tablet (5 mg total) by mouth 2 (two) times daily. 01/04/19  Yes BranchAlphonse Guild, MD  B Complex-Biotin-FA (B-COMPLEX PO) Take 1 tablet by mouth daily.   Yes [provider]  cyclobenzaprine (FLEXERIL) 10 MG tablet Take 10 mg by mouth at bedtime as needed for muscle spasms (for sleep).  01/16/19  Yes [provider]  diltiazem (CARDIZEM CD) 180 MG 24 hr capsule Take 1 capsule (180 mg total) by mouth daily. 08/15/18  Yes Tat, Shanon Brow, MD  famotidine (PEPCID) 40 MG tablet Take 1 tablet (40 mg total) by mouth 2 (two) times daily. 08/15/18  Yes Tat, Shanon Brow, MD  INDOMETHACIN PO Take 100 mg by mouth as needed (gout).    Yes [provider]  LORazepam (ATIVAN) 0.5 MG tablet Take 1.5 mg by mouth at bedtime as needed for anxiety.  01/19/19  Yes [provider]  LYSINE PO Take 1 capsule by mouth daily.   Yes [provider]  metoprolol succinate (TOPROL-XL) 100 MG 24 hr tablet Take 1 tablet (100 mg total) by mouth daily. Take with or immediately following a meal. 08/15/18  Yes Tat, David, MD  Omega-3 Fatty Acids (FISH OIL PO) Take 1 capsule by mouth daily.   Yes [provider]  predniSONE (DELTASONE) 1 MG tablet Take 6 mg by mouth daily. 33m total daily. Reducing by 1 dose monthly 11/26/18  Yes [provider]  sildenafil (VIAGRA) 100 MG tablet Take 100 mg by mouth daily as needed for erectile dysfunction.   Yes [provider]  sucralfate (CARAFATE) 1 g tablet Take 2 tablets (2 g total) by mouth 2 (two) times daily. Patient taking differently: Take 2 g by mouth daily.  08/15/18  Yes Tat, DShanon Brow MD  valACYclovir (VALTREX) 1000 MG tablet Take 1,000 mg by mouth as needed (cold sores).    Yes [provider]    Current Facility-Administered Medications  Medication Dose Route Frequency Provider Last Rate Last  Dose  . acetaminophen (TYLENOL) tablet 650 mg  650 mg Oral Q6H PRN SManuella Ghazi Pratik D, DO       Or  . acetaminophen (TYLENOL) suppository 650 mg  650 mg Rectal Q6H PRN SManuella Ghazi Pratik D, DO      . diltiazem (CARDIZEM) 100 mg in dextrose 5 % 100 mL (1 mg/mL) infusion  5-15 mg/hr Intravenous Continuous Shah, Pratik D, DO 11.5 mL/hr at 02/25/19 1247 11.5 mg/hr at 02/25/19 1247  . lactated ringers infusion   Intravenous Continuous SHeath LarkD, DO 125 mL/hr at 02/25/19 1219    .  metoprolol succinate (TOPROL-XL) 24 hr tablet 100 mg  100 mg Oral Daily Shah, Pratik D, DO      . pantoprazole (PROTONIX) injection 40 mg  40 mg Intravenous Q12H Shah, Pratik D, DO       Current Outpatient Medications  Medication Sig Dispense Refill  . apixaban (ELIQUIS) 5 MG TABS tablet Take 1 tablet (5 mg total) by mouth 2 (two) times daily. 60 tablet 6  . B Complex-Biotin-FA (B-COMPLEX PO) Take 1 tablet by mouth daily.    . cyclobenzaprine (FLEXERIL) 10 MG tablet Take 10 mg by mouth at bedtime as needed for muscle spasms (for sleep).     Marland Kitchen diltiazem (CARDIZEM CD) 180 MG 24 hr capsule Take 1 capsule (180 mg total) by mouth daily. 30 capsule 1  . famotidine (PEPCID) 40 MG tablet Take 1 tablet (40 mg total) by mouth 2 (two) times daily. 60 tablet 1  . INDOMETHACIN PO Take 100 mg by mouth as needed (gout).     . LORazepam (ATIVAN) 0.5 MG tablet Take 1.5 mg by mouth at bedtime as needed for anxiety.     Marland Kitchen LYSINE PO Take 1 capsule by mouth daily.    . metoprolol succinate (TOPROL-XL) 100 MG 24 hr tablet Take 1 tablet (100 mg total) by mouth daily. Take with or immediately following a meal. 30 tablet 1  . Omega-3 Fatty Acids (FISH OIL PO) Take 1 capsule by mouth daily.    . predniSONE (DELTASONE) 1 MG tablet Take 6 mg by mouth daily. 48m total daily. Reducing by 1 dose monthly    . sildenafil (VIAGRA) 100 MG tablet Take 100 mg by mouth daily as needed for erectile dysfunction.    . sucralfate (CARAFATE) 1 g tablet Take 2 tablets (2  g total) by mouth 2 (two) times daily. (Patient taking differently: Take 2 g by mouth daily. ) 120 tablet 1  . valACYclovir (VALTREX) 1000 MG tablet Take 1,000 mg by mouth as needed (cold sores).       Allergies as of 02/25/2019  . (No Known Allergies)    Family History  Problem Relation Age of Onset  . Cancer Mother   . Heart disease Father   . Heart disease Brother   . Heart disease Other   . Colon cancer Neg Hx   . Gastric cancer Neg Hx   . Esophageal cancer Neg Hx     Social History   Socioeconomic History  . Marital status: Single    Spouse name: Not on file  . Number of children: Not on file  . Years of education: Not on file  . Highest education level: Not on file  Occupational History  . Not on file  Social Needs  . Financial resource strain: Not on file  . Food insecurity:    Worry: Not on file    Inability: Not on file  . Transportation needs:    Medical: Not on file    Non-medical: Not on file  Tobacco Use  . Smoking status: Never Smoker  . Smokeless tobacco: Never Used  Substance and Sexual Activity  . Alcohol use: Not Currently    Comment: No ETOH 6-8 weeks (as of 02/15/19). "alcoholic all my life"; Currently drinks all day for 7 days straight 1-2 times (weeks) a month  . Drug use: No  . Sexual activity: Never  Lifestyle  . Physical activity:    Days per week: Not on file    Minutes per session: Not on file  .  Stress: Not on file  Relationships  . Social connections:    Talks on phone: Not on file    Gets together: Not on file    Attends religious service: Not on file    Active member of club or organization: Not on file    Attends meetings of clubs or organizations: Not on file    Relationship status: Not on file  . Intimate partner violence:    Fear of current or ex partner: Not on file    Emotionally abused: Not on file    Physically abused: Not on file    Forced sexual activity: Not on file  Other Topics Concern  . Not on file  Social  History Narrative  . Not on file    Review of Systems: General: Negative for anorexia, weight loss, fever, chills. Eyes: Negative for vision changes.  ENT: Negative for hoarseness, difficulty swallowing. CV: Negative for chest pain, angina, palpitations, peripheral edema.  Respiratory: Negative for dyspnea at rest, cough, sputum, wheezing.  GI: See history of present illness. MS: Negative for joint pain, low back pain.  Derm: Negative for rash or itching.  Endo: Negative for unusual weight change.  Heme: Negative for bruising or bleeding. Allergy: Negative for rash or hives.  Physical Exam: Vital signs in last 24 hours: Temp:  [97.6 F (36.4 C)] 97.6 F (36.4 C) (05/21 0854) Pulse Rate:  [28-105] 105 (05/21 1012) Resp:  [12-23] 17 (05/21 1300) BP: (113-151)/(77-132) 126/88 (05/21 1300) SpO2:  [97 %-100 %] 99 % (05/21 1230) Weight:  [88.5 kg] 88.5 kg (05/21 0854)   General:   Alert,  Well-developed, well-nourished, pleasant and cooperative in NAD Head:  Normocephalic and atraumatic. Eyes:  Sclera clear, no icterus. Conjunctiva pink. Ears:  Normal auditory acuity. Nose:  No deformity, discharge, or lesions. Lungs:  Clear throughout to auscultation. No wheezes, crackles, or rhonchi. No acute distress. Heart:  Irregularly irregular. HR 130s - 150s on tele monitoring and appears to be AFib; no murmurs, clicks, rubs,  or gallops. Abdomen:  Soft, nontender and nondistended. No masses, hepatosplenomegaly or hernias noted. Normal bowel sounds, without guarding, and without rebound.   Rectal:  Deferred.   Msk:  Symmetrical without gross deformities. Extremities:  Without clubbing or edema. Neurologic:  Alert and  oriented x4 Skin:  Intact without significant lesions or rashes. Cervical Nodes:  No significant cervical adenopathy. Psych:  Alert and cooperative. Normal mood and affect.  Intake/Output from previous day: No intake/output data recorded. Intake/Output this shift: No  intake/output data recorded.  Lab Results: Recent Labs    02/25/19 0920  WBC 10.1  HGB 15.1  HCT 43.0  PLT 253   BMET Recent Labs    02/25/19 0920  NA 134*  K 3.5  CL 97*  CO2 22  GLUCOSE 112*  BUN 31*  CREATININE 1.13  CALCIUM 9.1   LFT Recent Labs    02/25/19 0920  PROT 7.2  ALBUMIN 3.8  AST 17  ALT 18  ALKPHOS 43  BILITOT 1.0   PT/INR Recent Labs    02/25/19 0920  LABPROT 14.6  INR 1.2   Hepatitis Panel No results for input(s): HEPBSAG, HCVAB, HEPAIGM, HEPBIGM in the last 72 hours. C-Diff No results for input(s): CDIFFTOX in the last 72 hours.  Studies/Results: Ct Abdomen Pelvis W Contrast  Result Date: 02/25/2019 CLINICAL DATA:  Rectal bleeding with nausea and vomiting for the past 4 days. History of gastric ulcer and alcohol abuse. EXAM: CT ABDOMEN AND PELVIS  WITH CONTRAST TECHNIQUE: Multidetector CT imaging of the abdomen and pelvis was performed using the standard protocol following bolus administration of intravenous contrast. CONTRAST:  174m OMNIPAQUE IOHEXOL 300 MG/ML  SOLN COMPARISON:  Abdominal ultrasound dated December 23, 2018. CT abdomen pelvis dated January 20, 2013. FINDINGS: Lower chest: No acute abnormality. Hepatobiliary: No focal liver abnormality is seen. No gallstones, gallbladder wall thickening, or biliary dilatation. Pancreas: Unremarkable. No pancreatic ductal dilatation or surrounding inflammatory changes. Spleen: Normal in size without focal abnormality. Adrenals/Urinary Tract: Adrenal glands are unremarkable. Kidneys are normal, without renal calculi, focal lesion, or hydronephrosis. Bladder is unremarkable for the degree of distention. Stomach/Bowel: Moderate circumferential wall thickening of the cecum and ascending colon with mild surrounding inflammatory stranding. The remaining colon is unremarkable. The stomach and small bowel are within normal limits. No obstruction. Normal appendix. Vascular/Lymphatic: Aortic atherosclerosis. No  enlarged abdominal or pelvic lymph nodes. Reproductive: Prostate is unremarkable. Other: Unchanged small fat containing umbilical hernia. No free fluid or pneumoperitoneum. Musculoskeletal: No acute or significant osseous findings. Prominent Schmorl's nodes involving the T10 and T11 superior endplates. IMPRESSION: 1. Acute colitis involving the cecum and ascending colon. 2.  Aortic atherosclerosis (ICD10-I70.0). Electronically Signed   By: WTitus DubinM.D.   On: 02/25/2019 11:52    Impression: Very pleasant 68year old male with a history of alcoholism but no alcohol in 2 to 3 months.  He [facility] having GI [facility]  He had nausea and vomiting later in that day and emesis was black/tarry.  Also what sounds like melena stools noted to be heme positive in the ER.  He went into A. fib RVR and was placed on a Cardizem drip.  He is on an NSAID and is not on a PPI. Has not had Eliquis in about 3 days.  Likely upper GI bleed with a stated history of PUD in the setting of NSAIDs and no PPI. Epigastric discomfort has improved on PPI in the ER. Heme+ in the ED. BUN elevated at 31 (previously 11). Stable/normal hgb at this time.  Also noted colitis based on CT imaging, query infectious. No history of IBD, no family history of IBD. GI path panel pending collection. Colonoscopy scheduled for September to follow-[place] on colon[place]   Plan: 1. Protonix 40 mg bid 2. Consider need for antibiotics; Path panel pending 3. Clear liquid diet today 4. NPO after midnight 5. Plan for EGD on propofol/MAC when HR controlled 6. Supportive measures   Thank you for allowing [facility] participate in the care of MThana Ates DNP, AGNP-C Adult & Gerontological Nurse Practitioner [facility]    LOS: 0 days     02/25/2019, 1:30 PM

## 2019-02-25 NOTE — ED Triage Notes (Signed)
Dark red blood from rectum since Sunday night.  Pt reports this started after he vomited x 1 on Sunday.  Has not been able to eat since Sunday.

## 2019-02-26 ENCOUNTER — Inpatient Hospital Stay (HOSPITAL_COMMUNITY): Payer: Medicare HMO | Admitting: Anesthesiology

## 2019-02-26 ENCOUNTER — Other Ambulatory Visit: Payer: Self-pay

## 2019-02-26 ENCOUNTER — Encounter (HOSPITAL_COMMUNITY)
Admission: EM | Disposition: A | Discharge status: Home / Self Care | Payer: Self-pay | Source: Home / Self Care | Attending: Internal Medicine

## 2019-02-26 ENCOUNTER — Encounter (HOSPITAL_COMMUNITY): Payer: Self-pay | Admitting: Emergency Medicine

## 2019-02-26 DIAGNOSIS — K2970 Gastritis, unspecified, without bleeding: Secondary | ICD-10-CM | POA: Diagnosis not present

## 2019-02-26 DIAGNOSIS — K210 Gastro-esophageal reflux disease with esophagitis: Secondary | ICD-10-CM

## 2019-02-26 DIAGNOSIS — K269 Duodenal ulcer, unspecified as acute or chronic, without hemorrhage or perforation: Secondary | ICD-10-CM | POA: Diagnosis not present

## 2019-02-26 DIAGNOSIS — K920 Hematemesis: Secondary | ICD-10-CM | POA: Diagnosis not present

## 2019-02-26 HISTORY — PX: ESOPHAGOGASTRODUODENOSCOPY (EGD) WITH PROPOFOL: SHX5813

## 2019-02-26 HISTORY — PX: BIOPSY: SHX5522

## 2019-02-26 LAB — COMPREHENSIVE METABOLIC PANEL
ALT: 13 U/L (ref 0–44)
AST: 13 U/L — ABNORMAL LOW (ref 15–41)
Albumin: 2.8 g/dL — ABNORMAL LOW (ref 3.5–5.0)
Alkaline Phosphatase: 33 U/L — ABNORMAL LOW (ref 38–126)
Anion gap: 10 (ref 5–15)
BUN: 20 mg/dL (ref 8–23)
CO2: 21 mmol/L — ABNORMAL LOW (ref 22–32)
Calcium: 7.9 mg/dL — ABNORMAL LOW (ref 8.9–10.3)
Chloride: 102 mmol/L (ref 98–111)
Creatinine, Ser: 0.83 mg/dL (ref 0.61–1.24)
GFR calc Af Amer: >60 mL/min (ref >60)
GFR calc non Af Amer: >60 mL/min (ref >60)
Glucose, Bld: 95 mg/dL (ref 70–99)
Potassium: 3.5 mmol/L (ref 3.5–5.1)
Sodium: 133 mmol/L — ABNORMAL LOW (ref 135–145)
Total Bilirubin: 0.8 mg/dL (ref 0.3–1.2)
Total Protein: 5.3 g/dL — ABNORMAL LOW (ref 6.5–8.1)

## 2019-02-26 LAB — CBC
HCT: 35.7 % — ABNORMAL LOW (ref 39.0–52.0)
Hemoglobin: 11.9 g/dL — ABNORMAL LOW (ref 13.0–17.0)
MCH: 32.4 pg (ref 26.0–34.0)
MCHC: 33.3 g/dL (ref 30.0–36.0)
MCV: 97.3 fL (ref 80.0–100.0)
Platelets: 196 10*3/uL (ref 150–400)
RBC: 3.67 MIL/uL — ABNORMAL LOW (ref 4.22–5.81)
RDW: 14 % (ref 11.5–15.5)
WBC: 8.8 10*3/uL (ref 4.0–10.5)
nRBC: 0 % (ref 0.0–0.2)

## 2019-02-26 SURGERY — ESOPHAGOGASTRODUODENOSCOPY (EGD) WITH PROPOFOL
Anesthesia: Monitor Anesthesia Care

## 2019-02-26 MED ORDER — ALUM & MAG HYDROXIDE-SIMETH 200-200-20 MG/5ML PO SUSP: 30.0000 mL | ORAL | Status: DC | PRN

## 2019-02-26 MED ORDER — LACTATED RINGERS IV SOLN: INTRAVENOUS | Status: DC

## 2019-02-26 MED ORDER — MORPHINE SULFATE (PF) 2 MG/ML IV SOLN
2.0000 mg | INTRAVENOUS | Status: DC | PRN
  Administered 2019-02-26 – 2019-02-27 (×2): 2 mg via INTRAVENOUS
  Filled 2019-02-26 (×2): qty 1

## 2019-02-26 MED ORDER — KETAMINE HCL 50 MG/5ML IJ SOSY
PREFILLED_SYRINGE | INTRAMUSCULAR | Status: AC
  Filled 2019-02-26: qty 5

## 2019-02-26 MED ORDER — DICLOFENAC SODIUM 1 % TD GEL
4.0000 g | Freq: Four times a day (QID) | TRANSDERMAL | Status: DC
  Administered 2019-02-26: 4 g via TOPICAL
  Filled 2019-02-26: qty 100

## 2019-02-26 MED ORDER — HYDROCODONE-ACETAMINOPHEN 5-325 MG PO TABS
1.0000 | ORAL_TABLET | Freq: Four times a day (QID) | ORAL | Status: DC | PRN
  Administered 2019-02-26 – 2019-02-28 (×6): 2 via ORAL
  Filled 2019-02-26 (×7): qty 2

## 2019-02-26 MED ORDER — CHLORHEXIDINE GLUCONATE CLOTH 2 % EX PADS
6.0000 | MEDICATED_PAD | Freq: Every day | CUTANEOUS | Status: DC
  Administered 2019-02-26: 6 via TOPICAL

## 2019-02-26 MED ORDER — ACETAMINOPHEN 325 MG PO TABS: 650.0000 mg | ORAL_TABLET | Freq: Four times a day (QID) | ORAL | Status: DC | PRN

## 2019-02-26 MED ORDER — PROPOFOL 10 MG/ML IV BOLUS
INTRAVENOUS | Status: DC | PRN
  Administered 2019-02-26: 10 mg via INTRAVENOUS
  Administered 2019-02-26: 20 mg via INTRAVENOUS

## 2019-02-26 MED ORDER — GLYCOPYRROLATE PF 0.2 MG/ML IJ SOSY
PREFILLED_SYRINGE | INTRAMUSCULAR | Status: AC
  Filled 2019-02-26: qty 2

## 2019-02-26 MED ORDER — DICLOFENAC SODIUM 1 % TD GEL
4.0000 g | Freq: Four times a day (QID) | TRANSDERMAL | Status: DC
  Administered 2019-02-26 – 2019-02-27 (×6): 4 g via TOPICAL
  Filled 2019-02-26: qty 100

## 2019-02-26 MED ORDER — PROPOFOL 500 MG/50ML IV EMUL
INTRAVENOUS | Status: DC | PRN
  Administered 2019-02-26: 150 ug/kg/min via INTRAVENOUS

## 2019-02-26 MED ORDER — HYDROMORPHONE HCL 1 MG/ML IJ SOLN: 0.2500 mg | INTRAMUSCULAR | Status: DC | PRN

## 2019-02-26 MED ORDER — LACTATED RINGERS IV SOLN
INTRAVENOUS | Status: DC
  Administered 2019-02-26: 14:00:00 via INTRAVENOUS

## 2019-02-26 MED ORDER — METOPROLOL SUCCINATE ER 50 MG PO TB24
100.0000 mg | ORAL_TABLET | Freq: Every day | ORAL | Status: DC
  Administered 2019-02-27 – 2019-02-28 (×2): 100 mg via ORAL
  Filled 2019-02-26 (×2): qty 2

## 2019-02-26 MED ORDER — LORAZEPAM 1 MG PO TABS
1.0000 mg | ORAL_TABLET | Freq: Four times a day (QID) | ORAL | Status: DC | PRN
  Administered 2019-02-26: 1 mg via ORAL
  Filled 2019-02-26: qty 1

## 2019-02-26 MED ORDER — PANTOPRAZOLE SODIUM 40 MG PO TBEC
40.0000 mg | DELAYED_RELEASE_TABLET | Freq: Two times a day (BID) | ORAL | Status: DC
  Administered 2019-02-26 – 2019-02-28 (×4): 40 mg via ORAL
  Filled 2019-02-26 (×4): qty 1

## 2019-02-26 MED ORDER — CHLORHEXIDINE GLUCONATE CLOTH 2 % EX PADS
6.0000 | MEDICATED_PAD | Freq: Every day | CUTANEOUS | Status: DC
  Administered 2019-02-26 – 2019-02-27 (×2): 6 via TOPICAL

## 2019-02-26 MED ORDER — DILTIAZEM HCL 100 MG IV SOLR
5.0000 mg/h | INTRAVENOUS | Status: DC
  Administered 2019-02-26 (×2): 5 mg/h via INTRAVENOUS
  Filled 2019-02-26: qty 100

## 2019-02-26 MED ORDER — SODIUM CHLORIDE 0.9 % IV SOLN: INTRAVENOUS | Status: DC

## 2019-02-26 MED ORDER — PROPOFOL 10 MG/ML IV BOLUS
INTRAVENOUS | Status: AC
  Filled 2019-02-26: qty 20

## 2019-02-26 MED ORDER — SUCCINYLCHOLINE CHLORIDE 200 MG/10ML IV SOSY
PREFILLED_SYRINGE | INTRAVENOUS | Status: AC
  Filled 2019-02-26: qty 10

## 2019-02-26 MED ORDER — PHENYLEPHRINE 40 MCG/ML (10ML) SYRINGE FOR IV PUSH (FOR BLOOD PRESSURE SUPPORT)
PREFILLED_SYRINGE | INTRAVENOUS | Status: AC
  Filled 2019-02-26: qty 10

## 2019-02-26 MED ORDER — LORAZEPAM 1 MG PO TABS
1.0000 mg | ORAL_TABLET | Freq: Four times a day (QID) | ORAL | Status: DC | PRN
  Administered 2019-02-26 – 2019-02-28 (×6): 1 mg via ORAL
  Filled 2019-02-26 (×6): qty 1

## 2019-02-26 MED ORDER — MEPERIDINE HCL 50 MG/ML IJ SOLN: 6.2500 mg | INTRAMUSCULAR | Status: DC | PRN

## 2019-02-26 MED ORDER — LACTATED RINGERS IV SOLN
INTRAVENOUS | Status: DC
  Administered 2019-02-26 – 2019-02-27 (×2): via INTRAVENOUS

## 2019-02-26 MED ORDER — HYDROCODONE-ACETAMINOPHEN 7.5-325 MG PO TABS: 1.0000 | ORAL_TABLET | Freq: Once | ORAL | Status: DC | PRN

## 2019-02-26 MED ORDER — KETAMINE HCL 10 MG/ML IJ SOLN
INTRAMUSCULAR | Status: DC | PRN
  Administered 2019-02-26: 5 mg via INTRAVENOUS
  Administered 2019-02-26: 10 mg via INTRAVENOUS

## 2019-02-26 NOTE — Anesthesia Preprocedure Evaluation (Signed)
Anesthesia Evaluation    Airway Mallampati: II       Dental  (+) Teeth Intact   Pulmonary    breath sounds clear to auscultation       Cardiovascular hypertension, Atrial Fibrillation  Rhythm:regular     Neuro/Psych PSYCHIATRIC DISORDERS Anxiety Depression    GI/Hepatic   Endo/Other    Renal/GU      Musculoskeletal   Abdominal   Peds  Hematology  (+) Blood dyscrasia, anemia ,   Anesthesia Other Findings Afib w RVR ETOH abuse, h/o withdrawals Hepatic cirrhosis Now w/ hematemesis, h/o PUD  Reproductive/Obstetrics                             Anesthesia Physical Anesthesia Plan  ASA: III  Anesthesia Plan: MAC   Post-op Pain Management:    Induction:   PONV Risk Score and Plan:   Airway Management Planned:   Additional Equipment:   Intra-op Plan:   Post-operative Plan:   Informed Consent: I have reviewed the patients History and Physical, chart, labs and discussed the procedure including the risks, benefits and alternatives for the proposed anesthesia with the patient or authorized representative who has indicated his/her understanding and acceptance.       Plan Discussed with: Anesthesiologist  Anesthesia Plan Comments:         Anesthesia Quick Evaluation

## 2019-02-26 NOTE — Progress Notes (Signed)
Rn bladder scanned pt d/t no urine output since admit and receiving IVF. Bladder scan revealed >800 in urine. Pt assisted OOB to to BR- voided 671mL amber urine.  Will continue to monitor pt

## 2019-02-26 NOTE — Anesthesia Postprocedure Evaluation (Signed)
Anesthesia Post Note  Patient: Miguel Spencer  Procedure(s) Performed: ESOPHAGOGASTRODUODENOSCOPY (EGD) WITH PROPOFOL (N/A ) BIOPSY  Patient location during evaluation: PACU Anesthesia Type: MAC Level of consciousness: awake and alert and oriented Pain management: pain level controlled Vital Signs Assessment: post-procedure vital signs reviewed and stable Respiratory status: spontaneous breathing Cardiovascular status: stable Postop Assessment: no apparent nausea or vomiting Anesthetic complications: no     Last Vitals:  Vitals:   02/26/19 1326 02/26/19 1550  BP: 133/83 95/76  Pulse: 99 (!) 106  Resp: 17 (!) 22  Temp: 36.8 C 36.8 C  SpO2: 97% 93%    Last Pain:  Vitals:   02/26/19 1326  TempSrc: Oral  PainSc: 0-No pain                 ADAMS, AMY A

## 2019-02-26 NOTE — Interval H&P Note (Signed)
History and Physical Interval Note:  02/26/2019 3:22 PM  Miguel Spencer  has presented today for surgery, with the diagnosis of GI BLEED.  The various methods of treatment have been discussed with the patient and family. After consideration of risks, benefits and other options for treatment, the patient has consented to  Procedure(s): ESOPHAGOGASTRODUODENOSCOPY (EGD) WITH PROPOFOL (N/A) as a surgical intervention.  The patient's history has been reviewed, patient examined, no change in status, stable for surgery.  I have reviewed the patient's chart and labs.  Questions were answered to the patient's satisfaction.     [employer]

## 2019-02-26 NOTE — Progress Notes (Signed)
REVIEWED. PT SEEN AND EXAMINED. PAIN IMPROVED. TOLERATING pos. No more nausea or vomiting. PLAN EGD TODAY. DISCUSSED PROCEDURE, BENEFITS, & RISKS: < 1% chance of medication reaction, bleeding, perforation, or ASPIRATION.

## 2019-02-26 NOTE — Care Management Important Message (Signed)
Important Message  Patient Details  Name: Miguel Spencer MRN: [id number] Date of Birth: 1950/11/16   Medicare Important Message Given:  Yes    Tommy Medal 02/26/2019, 4:19 PM

## 2019-02-26 NOTE — Transfer of Care (Signed)
Immediate Anesthesia Transfer of Care Note  Patient: Miguel Spencer  Procedure(s) Performed: ESOPHAGOGASTRODUODENOSCOPY (EGD) WITH PROPOFOL (N/A ) BIOPSY  Patient Location: PACU  Anesthesia Type:MAC  Level of Consciousness: awake, alert , oriented and patient cooperative  Airway & Oxygen Therapy: Patient Spontanous Breathing and Patient connected to nasal cannula oxygen  Post-op Assessment: Report given to RN and Post -op Vital signs reviewed and stable  Post vital signs: Reviewed and stable  Last Vitals:  Vitals Value Taken Time  BP 95/76 02/26/2019  3:50 PM  Temp 36.8 C 02/26/2019  3:50 PM  Pulse 81 02/26/2019  3:54 PM  Resp 17 02/26/2019  3:54 PM  SpO2 93 % 02/26/2019  3:54 PM  Vitals shown include unvalidated device data.  Last Pain:  Vitals:   02/26/19 1326  TempSrc: Oral  PainSc: 0-No pain         Complications: No apparent anesthesia complications

## 2019-02-26 NOTE — H&P (View-Only) (Signed)
REVIEWED. PT SEEN AND EXAMINED. PAIN IMPROVED. TOLERATING pos. No more nausea or vomiting. PLAN EGD TODAY. DISCUSSED PROCEDURE, BENEFITS, & RISKS: < 1% chance of medication reaction, bleeding, perforation, or ASPIRATION.

## 2019-02-26 NOTE — Op Note (Addendum)
[facility] Patient Name: Miguel Spencer Procedure Date: 02/26/2019 3:13 PM MRN: [id number] Date of Birth: [birth year] Attending MD: Barney Drain MD, MD CSN: [id number] Age: 68 Admit Type: Inpatient Procedure:                Upper GI endoscopy WITH COLD FORCEPS BIOPSY Indications:              Hematemesis. TAKES INDOCIN PRN FOR GOUT. Providers:                Barney Drain MD, MD, Gwynneth Albright RN, RN,                            Nelma Rothman, Technician Referring MD:             Edwinna Areola. Hall MD Medicines:                Propofol per Anesthesia Complications:            No immediate complications. Estimated Blood Loss:     Estimated blood loss was minimal. Procedure:                Pre-Anesthesia Assessment:                           - Prior to the procedure, a History and Physical                            was performed, and patient medications and                            allergies were reviewed. The patient's tolerance of                            previous anesthesia was also reviewed. The risks                            and benefits of the procedure and the sedation                            options and risks were discussed with the patient.                            All questions were answered, and informed consent                            was obtained. Prior Anticoagulants: The patient has                            taken Eliquis (apixaban), last dose was 5 days                            prior to procedure. ASA Grade Assessment: II - A                            patient with mild systemic disease. After reviewing  the risks and benefits, the patient was deemed in                            satisfactory condition to undergo the procedure.                            After obtaining informed consent, the endoscope was                            passed under direct vision. Throughout the                            procedure, the patient's blood  pressure, pulse, and                            oxygen saturations were monitored continuously. The                            [id number]-H190 ([id number]) scope was introduced through the                            mouth, and advanced to the second part of duodenum.                            The upper GI endoscopy was accomplished without                            difficulty. The patient tolerated the procedure                            well. Scope In: 3:33:00 PM Scope Out: 3:38:33 PM Total Procedure Duration: 0 hours 5 minutes 33 seconds  Findings:      LA Grade D (one or more mucosal breaks involving at least 75% of       esophageal circumference) esophagitis with no bleeding was found 33 to       41 cm from the incisors. EGJ 41 CM FROM THE INCISORS.      Diffuse mild inflammation characterized by congestion (edema), erosions       and erythema was found in the gastric body and in the gastric antrum.       Biopsies(2: BODY, 1 INCISURA, 2: ANTRUM) were taken with a cold forceps       for Helicobacter pylori testing.      Five non-bleeding superficial duodenal ulcers with no stigmata of       bleeding were found in the duodenal bulb.      The second portion of the duodenum was normal. Impression:               - HEMATEMESIS DUE TO EROSIVE esophagitis.                           - NSIAD Gastritis.                           - Non-bleeding duodenal ulcers with no stigmata of  bleeding.                           . Moderate Sedation:      Per Anesthesia Care Recommendation:           - Patient has a contact number available for                            emergencies. The signs and symptoms of potential                            delayed complications were discussed with the                            patient. Return to normal activities tomorrow.                            Written discharge instructions were provided to the                            patient.                            - Low fat diet. RE-START ELIQUIS ON MAY 24.                           - Continue present medications. PROTONIX BID FOR                            ONE MO THEN ONCE DAILY. USE PEPCID PRN BREAKTHROUGH                            HEARTBURN.                           - Await pathology results.                           - Return to GI clinic in 4 months. Procedure Code(s):        --- Professional ---                           [phone], Esophagogastroduodenoscopy, flexible,                            transoral; with biopsy, single or multiple Diagnosis Code(s):        --- Professional ---                           K21.0, Gastro-esophageal reflux disease with                            esophagitis                           K29.70, Gastritis, unspecified, without bleeding  K26.9, Duodenal ulcer, unspecified as acute or                            chronic, without hemorrhage or perforation                           K92.0, Hematemesis CPT copyright 2019 American Medical Association. All rights reserved. The codes documented in this report are preliminary and upon coder review may  be revised to meet current compliance requirements. Barney Drain, MD Barney Drain MD, MD 02/26/2019 3:54:18 PM This report has been signed electronically. Number of Addenda: 0

## 2019-02-26 NOTE — Anesthesia Procedure Notes (Signed)
Procedure Name: MAC Date/Time: 02/26/2019 3:22 PM Performed by: Andree Elk Amy A, CRNA Pre-anesthesia Checklist: Patient identified, Emergency Drugs available, Suction available, Timeout performed and Patient being monitored Patient Re-evaluated:Patient Re-evaluated prior to induction Oxygen Delivery Method: Nasal Cannula

## 2019-02-26 NOTE — Progress Notes (Signed)
PROGRESS NOTE    Miguel Spencer  RKY:[id number] DOB: 04/09/1951 DOA: 02/25/2019 PCP: [name], MD   Brief Narrative:  Per HPI: Miguel Spencer is a 68 y.o. male with medical history significant for persistent atrial fibrillation on Eliquis, hypertension, colon polyps with prior colonoscopy 07/2018, gout, prior alcohol abuse, and questionable history of gastric ulcer who presented to the ED today with progression of GI symptoms that began this past Sunday.  He states that after having Janine Limbo on that day he began to have some nausea and vomiting that was noted to be dark with what sounds like coffee ground emesis.  He noted some persistent, dull, and achy epigastric abdominal pain which worsened with anything that he ate and therefore, he has not had anything to eat over the last 2 to 3 days.  He could barely tolerate any liquid intake until just yesterday when he was able to have some milk and ice water.  He states that he also developed some loose, liquidy stools that were quite dark as well.  He stopped taking all of his home medications to include Eliquis since Monday.  He can hardly tolerate any p.o. intake and continues to have epigastric discomfort and a sense of nausea.  He denies any chest pain, shortness of breath, lightheadedness, dizziness, or crampy abdominal pain.  He denies any tobacco abuse and any current alcohol use and states that he quit several months ago.  He does take ibuprofen as needed for random aches and pains, but not consistently.  He has apparently had a prior colonoscopy on 07/2018 with polyps noted and was due to have these repeated with Dr. Oneida Alar in September of this year.  He apparently has never had an EGD.  Patient was admitted with acute GI bleed as well as atrial fibrillation with RVR on Cardizem drip.  He started to feel better on the morning of 5/22 and is anticipating EGD.  Assessment & Plan:   Active Problems:   Atrial fibrillation with rapid ventricular  response (HCC)   Hypertension   Gout   History of peptic ulcer disease   Colitis   Acute GI bleed -Monitor repeat CBC in a.m. -Transfuse for hemoglobin less than 7 -Hemoglobin downtrending to 11.9 this a.m.  Will monitor with repeat CBC in a.m. -GI planning for EGD today -N.p.o. for now -PPI twice daily and avoid heparin agents and hold Eliquis  Atrial fibrillation with RVR-improved -Maintain on Cardizem drip and monitor for rate control until EGD completed and then convert to oral medications -Continue home metoprolol -Avoid Eliquis until EGD performed  Colitis -Check viral GI pathogen panel and hold initiation of antibiotics for now -No significant leukocytosis currently noted or abdominal tenderness  GERD/?  PUD -Hold Carafate and Pepcid for now and placed on PPI twice daily as noted above  Gout -Currently asymptomatic, will monitor  Hypertension-stable -Currently stable and well-controlled; monitor carefully on Cardizem drip -Continue beta-blocker   DVT prophylaxis: SCDs Code Status: Full Family Communication: None at bedside Disposition Plan: Per GI with EGD planned for today.  Continue to wean Cardizem for heart rate control.   Consultants:   GI  Procedures:   None  Antimicrobials:   None   Subjective: Patient seen and evaluated today with no new acute complaints or concerns. No acute concerns or events noted overnight.  He has had no further bowel movements, nausea, or vomiting and tolerated his clear liquid diet yesterday.  Heart rate is better controlled on  Cardizem drip.  He states he is having some discomfort to his right elbow which he feels may be the start of a gout flare.  Objective: Vitals:   02/26/19 0430 02/26/19 0500 02/26/19 0530 02/26/19 0600  BP: 94/77 104/70 108/90 102/83  Pulse: 96 76 90 70  Resp: (!) 21 (!) 24 20 15   Temp:      TempSrc:      SpO2: 96% 97% 97% 98%  Weight:  93.4 kg    Height:        Intake/Output  Summary (Last 24 hours) at 02/26/2019 0734 Last data filed at 02/26/2019 0300 Gross per 24 hour  Intake 2709.93 ml  Output 600 ml  Net 2109.93 ml   Filed Weights   02/25/19 0854 02/25/19 1639 02/26/19 0500  Weight: 88.5 kg 78.7 kg 93.4 kg    Examination:  General exam: Appears calm and comfortable  Respiratory system: Clear to auscultation. Respiratory effort normal. Cardiovascular system: S1 & S2 heard, irregular and mildly tachycardic. No JVD, murmurs, rubs, gallops or clicks. No pedal edema. Gastrointestinal system: Abdomen is nondistended, soft and nontender. No organomegaly or masses felt. Normal bowel sounds heard. Central nervous system: Alert and oriented. No focal neurological deficits. Extremities: Symmetric 5 x 5 power. Skin: No rashes, lesions or ulcers Psychiatry: Judgement and insight appear normal. Mood & affect appropriate.     Data Reviewed: I have personally reviewed following labs and imaging studies  CBC: Recent Labs  Lab 02/25/19 0920 02/26/19 0601  WBC 10.1 8.8  NEUTROABS 6.1  --   HGB 15.1 11.9*  HCT 43.0 35.7*  MCV 93.7 97.3  PLT 253 818   Basic Metabolic Panel: Recent Labs  Lab 02/25/19 0920 02/26/19 0601  NA 134* 133*  K 3.5 3.5  CL 97* 102  CO2 22 21*  GLUCOSE 112* 95  BUN 31* 20  CREATININE 1.13 0.83  CALCIUM 9.1 7.9*   GFR: Estimated Creatinine Clearance: 102.5 mL/min (by C-G formula based on SCr of 0.83 mg/dL). Liver Function Tests: Recent Labs  Lab 02/25/19 0920 02/26/19 0601  AST 17 13*  ALT 18 13  ALKPHOS 43 33*  BILITOT 1.0 0.8  PROT 7.2 5.3*  ALBUMIN 3.8 2.8*   Recent Labs  Lab 02/25/19 0920  LIPASE 53*   No results for input(s): AMMONIA in the last 168 hours. Coagulation Profile: Recent Labs  Lab 02/25/19 0920  INR 1.2   Cardiac Enzymes: No results for input(s): CKTOTAL, CKMB, CKMBINDEX, TROPONINI in the last 168 hours. BNP (last 3 results) No results for input(s): PROBNP in the last 8760  hours. HbA1C: No results for input(s): HGBA1C in the last 72 hours. CBG: No results for input(s): GLUCAP in the last 168 hours. Lipid Profile: No results for input(s): CHOL, HDL, LDLCALC, TRIG, CHOLHDL, LDLDIRECT in the last 72 hours. Thyroid Function Tests: No results for input(s): TSH, T4TOTAL, FREET4, T3FREE, THYROIDAB in the last 72 hours. Anemia Panel: No results for input(s): VITAMINB12, FOLATE, FERRITIN, TIBC, IRON, RETICCTPCT in the last 72 hours. Sepsis Labs: No results for input(s): PROCALCITON, LATICACIDVEN in the last 168 hours.  Recent Results (from the past 240 hour(s))  SARS Coronavirus 2 (CEPHEID - Performed in [place] hospital lab), Hosp Order     Status: None   Collection Time: 02/25/19 12:15 PM  Result Value Ref Range Status   SARS Coronavirus 2 NEGATIVE NEGATIVE Final    Comment: (NOTE) If result is NEGATIVE SARS-CoV-2 target nucleic acids are NOT DETECTED. The SARS-CoV-2 RNA  is generally detectable in upper and lower  respiratory specimens during the acute phase of infection. The lowest  concentration of SARS-CoV-2 viral copies this assay can detect is 250  copies / mL. A negative result does not preclude SARS-CoV-2 infection  and should not be used as the sole basis for treatment or other  patient management decisions.  A negative result may occur with  improper specimen collection / handling, submission of specimen other  than nasopharyngeal swab, presence of viral mutation(s) within the  areas targeted by this assay, and inadequate number of viral copies  (<250 copies / mL). A negative result must be combined with clinical  observations, patient history, and epidemiological information. If result is POSITIVE SARS-CoV-2 target nucleic acids are DETECTED. The SARS-CoV-2 RNA is generally detectable in upper and lower  respiratory specimens dur ing the acute phase of infection.  Positive  results are indicative of active infection with SARS-CoV-2.   Clinical  correlation with patient history and other diagnostic information is  necessary to determine patient infection status.  Positive results do  not rule out bacterial infection or co-infection with other viruses. If result is PRESUMPTIVE POSTIVE SARS-CoV-2 nucleic acids MAY BE PRESENT.   A presumptive positive result was obtained on the submitted specimen  and confirmed on repeat testing.  While 2019 novel coronavirus  (SARS-CoV-2) nucleic acids may be present in the submitted sample  additional confirmatory testing may be necessary for epidemiological  and / or clinical management purposes  to differentiate between  SARS-CoV-2 and other Sarbecovirus currently known to infect humans.  If clinically indicated additional testing with an alternate test  methodology [phone]) is advised. The SARS-CoV-2 RNA is generally  detectable in upper and lower respiratory sp ecimens during the acute  phase of infection. The expected result is Negative. Fact Sheet for Patients:  StrictlyIdeas.no Fact Sheet for Healthcare Providers: BankingDealers.co.za This test is not yet approved or cleared by the [place] FDA and has been authorized for detection and/or diagnosis of SARS-CoV-2 by FDA under an Emergency Use Authorization (EUA).  This EUA will remain in effect (meaning this test can be used) for the duration of the COVID-19 declaration under Section 564(b)(1) of the Act, 21 U.S.C. section 360bbb-3(b)(1), unless the authorization is terminated or revoked sooner. Performed at [facility], [address] [place], [place] [address]   MRSA PCR Screening     Status: None   Collection Time: 02/25/19  3:52 PM  Result Value Ref Range Status   MRSA by PCR NEGATIVE NEGATIVE Final    Comment:        The GeneXpert MRSA Assay (FDA approved for NASAL specimens only), is one component of a comprehensive MRSA colonization surveillance program. It is  not intended to diagnose MRSA infection nor to guide or monitor treatment for MRSA infections. Performed at [facility], [address], [place], [place] [address]          Radiology Studies: Ct Abdomen Pelvis W Contrast  Result Date: 02/25/2019 CLINICAL DATA:  Rectal bleeding with nausea and vomiting for the past 4 days. History of gastric ulcer and alcohol abuse. EXAM: CT ABDOMEN AND PELVIS WITH CONTRAST TECHNIQUE: Multidetector CT imaging of the abdomen and pelvis was performed using the standard protocol following bolus administration of intravenous contrast. CONTRAST:  155mL OMNIPAQUE IOHEXOL 300 MG/ML  SOLN COMPARISON:  Abdominal ultrasound dated December 23, 2018. CT abdomen pelvis dated January 20, 2013. FINDINGS: Lower chest: No acute abnormality. Hepatobiliary: No focal liver abnormality  is seen. No gallstones, gallbladder wall thickening, or biliary dilatation. Pancreas: Unremarkable. No pancreatic ductal dilatation or surrounding inflammatory changes. Spleen: Normal in size without focal abnormality. Adrenals/Urinary Tract: Adrenal glands are unremarkable. Kidneys are normal, without renal calculi, focal lesion, or hydronephrosis. Bladder is unremarkable for the degree of distention. Stomach/Bowel: Moderate circumferential wall thickening of the cecum and ascending colon with mild surrounding inflammatory stranding. The remaining colon is unremarkable. The stomach and small bowel are within normal limits. No obstruction. Normal appendix. Vascular/Lymphatic: Aortic atherosclerosis. No enlarged abdominal or pelvic lymph nodes. Reproductive: Prostate is unremarkable. Other: Unchanged small fat containing umbilical hernia. No free fluid or pneumoperitoneum. Musculoskeletal: No acute or significant osseous findings. Prominent Schmorl's nodes involving the T10 and T11 superior endplates. IMPRESSION: 1. Acute colitis involving the cecum and ascending colon. 2.  Aortic atherosclerosis (ICD10-I70.0).  Electronically Signed   By: Titus Dubin M.D.   On: 02/25/2019 11:52        Scheduled Meds:  Chlorhexidine Gluconate Cloth  6 each Topical Q0600   diclofenac sodium  4 g Topical QID   metoprolol succinate  100 mg Oral Daily   pantoprazole (PROTONIX) IV  40 mg Intravenous Q12H   Continuous Infusions:  diltiazem (CARDIZEM) infusion 2.5 mg/hr (02/26/19 0309)   lactated ringers 125 mL/hr at 02/26/19 0305     LOS: 1 day    Time spent: 30 minutes    Maliik Karner Darleen Crocker, DO [facility] Pager [phone]  If 7PM-7AM, please contact night-coverage [email or web address] Password [facility] 02/26/2019, 7:34 AM

## 2019-02-27 ENCOUNTER — Encounter (HOSPITAL_COMMUNITY): Payer: Self-pay

## 2019-02-27 DIAGNOSIS — K920 Hematemesis: Secondary | ICD-10-CM

## 2019-02-27 LAB — BASIC METABOLIC PANEL
Anion gap: 9 (ref 5–15)
BUN: 15 mg/dL (ref 8–23)
CO2: 22 mmol/L (ref 22–32)
Calcium: 7.8 mg/dL — ABNORMAL LOW (ref 8.9–10.3)
Chloride: 102 mmol/L (ref 98–111)
Creatinine, Ser: 0.79 mg/dL (ref 0.61–1.24)
GFR calc Af Amer: >60 mL/min (ref >60)
GFR calc non Af Amer: >60 mL/min (ref >60)
Glucose, Bld: 92 mg/dL (ref 70–99)
Potassium: 3.6 mmol/L (ref 3.5–5.1)
Sodium: 133 mmol/L — ABNORMAL LOW (ref 135–145)

## 2019-02-27 LAB — CBC
HCT: 32.6 % — ABNORMAL LOW (ref 39.0–52.0)
Hemoglobin: 11.2 g/dL — ABNORMAL LOW (ref 13.0–17.0)
MCH: 33 pg (ref 26.0–34.0)
MCHC: 34.4 g/dL (ref 30.0–36.0)
MCV: 96.2 fL (ref 80.0–100.0)
Platelets: 180 10*3/uL (ref 150–400)
RBC: 3.39 MIL/uL — ABNORMAL LOW (ref 4.22–5.81)
RDW: 13.9 % (ref 11.5–15.5)
WBC: 8.3 10*3/uL (ref 4.0–10.5)
nRBC: 0 % (ref 0.0–0.2)

## 2019-02-27 MED ORDER — PREDNISONE 5 MG PO TABS
6.0000 mg | ORAL_TABLET | Freq: Every day | ORAL | Status: DC
  Administered 2019-02-27: 6 mg via ORAL
  Filled 2019-02-27 (×3): qty 1

## 2019-02-27 MED ORDER — SUCRALFATE 1 G PO TABS
2.0000 g | ORAL_TABLET | Freq: Every day | ORAL | Status: DC
  Administered 2019-02-27: 2 g via ORAL
  Filled 2019-02-27: qty 2

## 2019-02-27 MED ORDER — CYCLOBENZAPRINE HCL 10 MG PO TABS: 10.0000 mg | ORAL_TABLET | Freq: Every evening | ORAL | Status: DC | PRN

## 2019-02-27 MED ORDER — DILTIAZEM HCL 60 MG PO TABS
60.0000 mg | ORAL_TABLET | Freq: Three times a day (TID) | ORAL | Status: DC
  Administered 2019-02-27 – 2019-02-28 (×4): 60 mg via ORAL
  Filled 2019-02-27 (×4): qty 1

## 2019-02-27 NOTE — Progress Notes (Signed)
Report given to Katharine Look, RN on 300 at this time.

## 2019-02-27 NOTE — Progress Notes (Signed)
PROGRESS NOTE    BEDFORD WINSOR  KGU:[id number] DOB: 08/22/1951 DOA: 02/25/2019 PCP: [name], MD   Brief Narrative:  Per HPI: Miguel Spencer a 68 y.o.malewith medical history significant forpersistent atrial fibrillation on Eliquis, hypertension, colon polyps with prior colonoscopy 07/2018, gout, prior alcohol abuse, and questionable history of gastric ulcer who presented to the ED today with progression of GI symptoms that began this past Sunday. He states that after having Miguel Spencer on that day he began to have some nausea and vomiting that was noted to be dark with what sounds like coffee ground emesis. He noted some persistent, dull, and achy epigastric abdominal pain which worsened with anything that he ate and therefore, he has not had anything to eat over the last 2 to 3 days. He could barely tolerate any liquid intake until just yesterday when he was able to have some milk and ice water. He states that he also developed some loose, liquidy stools that were quite dark as well. He stopped taking all of his home medications to include Eliquis since Monday. He can hardly tolerate any p.o. intake and continues to have epigastric discomfort and a sense of nausea. He denies any chest pain, shortness of breath, lightheadedness, dizziness, or crampy abdominal pain. He denies any tobacco abuse and any current alcohol use and states that he quit several months ago. He does take ibuprofen as needed for random aches and pains, but not consistently. He has apparently had a prior colonoscopy on 07/2018 with polyps noted and was due to have these repeated with Dr. Oneida Alar in September of this year. He apparently has never had an EGD.  Patient was admitted with acute GI bleed as well as atrial fibrillation with RVR on Cardizem drip.  He started to feel better on the morning of 5/22 and has undergone EGD with findings of esophagitis and gastritis with shallow ulcers and no active bleeding.  He  has been recommended to remain on PPI twice daily.  Assessment & Plan:   Active Problems:   Atrial fibrillation with rapid ventricular response (HCC)   Hypertension   Gout   History of peptic ulcer disease   Colitis   Hematemesis with nausea  Acute GI bleed-stable -Monitor repeat CBC in a.m. -Transfuse for hemoglobin less than 7 -Hemoglobin stable this a.m. -Continue current diet and remain on PPI twice daily oral  Atrial fibrillation with RVR-improved -Discontinue Cardizem drip and convert to oral medications -Continue home metoprolol -Avoid Eliquis until 5/24 -Monitor overnight for stability and anticipate discharge in a.m.  Colitis -Check viral GI pathogen panel and hold initiation of antibiotics for now -No significant leukocytosis currently noted or abdominal tenderness  GERD/gastritis/esophagitis/duodenal ulcers -Avoid further NSAIDs and remain on PPI twice daily especially with prednisone use -Continue sucralfate  Gout -Currently with mild symptomatology to right elbow -Continue topical diclofenac and restart home prednisone -Norco as needed  Hypertension-stable  -Discontinue Cardizem drip and transition to oral Cardizem today -Continue beta-blocker   DVT prophylaxis: SCDs Code Status: Full Family Communication: None at bedside Disposition Plan:  Appreciate GI recommendations with diet restarted and to remain on PPI twice daily.  Eliquis to restart on 5/24.  Patient still on Cardizem drip overnight which will be weaned today and monitor further on telemetry overnight to ensure stability prior to discharge anticipated in a.m.   Consultants:   GI  Procedures:   None  Antimicrobials:   None  Subjective: Patient seen and evaluated today with no new  acute complaints or concerns. No acute concerns or events noted overnight.  He states that he is starting to feel somewhat better and his heart rate is controlled.  He is able to tolerate diet and  has had no further bowel movement.  He still remains on Cardizem drip.  Objective: Vitals:   02/27/19 0500 02/27/19 0600 02/27/19 0700 02/27/19 0732  BP: 116/78 (!) 113/96 95/83   Pulse: 89 69 83   Resp: (!) 26 20 (!) 22   Temp:    97.9 F (36.6 C)  TempSrc:    Oral  SpO2: 96% 95% 96% 96%  Weight:      Height:        Intake/Output Summary (Last 24 hours) at 02/27/2019 0759 Last data filed at 02/27/2019 0500 Gross per 24 hour  Intake 846.05 ml  Output 1875 ml  Net -1028.95 ml   Filed Weights   02/25/19 0854 02/25/19 1639 02/26/19 0500  Weight: 88.5 kg 78.7 kg 93.4 kg    Examination:  General exam: Appears calm and comfortable  Respiratory system: Clear to auscultation. Respiratory effort normal. Cardiovascular system: S1 & S2 heard, irregular and mildly tachycardic. No JVD, murmurs, rubs, gallops or clicks. No pedal edema. Gastrointestinal system: Abdomen is nondistended, soft and nontender. No organomegaly or masses felt. Normal bowel sounds heard. Central nervous system: Alert and oriented. No focal neurological deficits. Extremities: Symmetric 5 x 5 power. Skin: No rashes, lesions or ulcers Psychiatry: Judgement and insight appear normal. Mood & affect appropriate.     Data Reviewed: I have personally reviewed following labs and imaging studies  CBC: Recent Labs  Lab 02/25/19 0920 02/26/19 0601 02/27/19 0316  WBC 10.1 8.8 8.3  NEUTROABS 6.1  --   --   HGB 15.1 11.9* 11.2*  HCT 43.0 35.7* 32.6*  MCV 93.7 97.3 96.2  PLT 253 196 308   Basic Metabolic Panel: Recent Labs  Lab 02/25/19 0920 02/26/19 0601 02/27/19 0316  NA 134* 133* 133*  K 3.5 3.5 3.6  CL 97* 102 102  CO2 22 21* 22  GLUCOSE 112* 95 92  BUN 31* 20 15  CREATININE 1.13 0.83 0.79  CALCIUM 9.1 7.9* 7.8*   GFR: Estimated Creatinine Clearance: 106.3 mL/min (by C-G formula based on SCr of 0.79 mg/dL). Liver Function Tests: Recent Labs  Lab 02/25/19 0920 02/26/19 0601  AST 17 13*  ALT  18 13  ALKPHOS 43 33*  BILITOT 1.0 0.8  PROT 7.2 5.3*  ALBUMIN 3.8 2.8*   Recent Labs  Lab 02/25/19 0920  LIPASE 53*   No results for input(s): AMMONIA in the last 168 hours. Coagulation Profile: Recent Labs  Lab 02/25/19 0920  INR 1.2   Cardiac Enzymes: No results for input(s): CKTOTAL, CKMB, CKMBINDEX, TROPONINI in the last 168 hours. BNP (last 3 results) No results for input(s): PROBNP in the last 8760 hours. HbA1C: No results for input(s): HGBA1C in the last 72 hours. CBG: No results for input(s): GLUCAP in the last 168 hours. Lipid Profile: No results for input(s): CHOL, HDL, LDLCALC, TRIG, CHOLHDL, LDLDIRECT in the last 72 hours. Thyroid Function Tests: No results for input(s): TSH, T4TOTAL, FREET4, T3FREE, THYROIDAB in the last 72 hours. Anemia Panel: No results for input(s): VITAMINB12, FOLATE, FERRITIN, TIBC, IRON, RETICCTPCT in the last 72 hours. Sepsis Labs: No results for input(s): PROCALCITON, LATICACIDVEN in the last 168 hours.  Recent Results (from the past 240 hour(s))  SARS Coronavirus 2 (CEPHEID - Performed in [place] lab), [facility]  Order     Status: None   Collection Time: 02/25/19 12:15 PM  Result Value Ref Range Status   SARS Coronavirus 2 NEGATIVE NEGATIVE Final    Comment: (NOTE) If result is NEGATIVE SARS-CoV-2 target nucleic acids are NOT DETECTED. The SARS-CoV-2 RNA is generally detectable in upper and lower  respiratory specimens during the acute phase of infection. The lowest  concentration of SARS-CoV-2 viral copies this assay can detect is 250  copies / mL. A negative result does not preclude SARS-CoV-2 infection  and should not be used as the sole basis for treatment or other  patient management decisions.  A negative result may occur with  improper specimen collection / handling, submission of specimen other  than nasopharyngeal swab, presence of viral mutation(s) within the  areas targeted by this assay, and inadequate  number of viral copies  (<250 copies / mL). A negative result must be combined with clinical  observations, patient history, and epidemiological information. If result is POSITIVE SARS-CoV-2 target nucleic acids are DETECTED. The SARS-CoV-2 RNA is generally detectable in upper and lower  respiratory specimens dur ing the acute phase of infection.  Positive  results are indicative of active infection with SARS-CoV-2.  Clinical  correlation with patient history and other diagnostic information is  necessary to determine patient infection status.  Positive results do  not rule out bacterial infection or co-infection with other viruses. If result is PRESUMPTIVE POSTIVE SARS-CoV-2 nucleic acids MAY BE PRESENT.   A presumptive positive result was obtained on the submitted specimen  and confirmed on repeat testing.  While 2019 novel coronavirus  (SARS-CoV-2) nucleic acids may be present in the submitted sample  additional confirmatory testing may be necessary for epidemiological  and / or clinical management purposes  to differentiate between  SARS-CoV-2 and other Sarbecovirus currently known to infect humans.  If clinically indicated additional testing with an alternate test  methodology [phone]) is advised. The SARS-CoV-2 RNA is generally  detectable in upper and lower respiratory sp ecimens during the acute  phase of infection. The expected result is Negative. Fact Sheet for Patients:  StrictlyIdeas.no Fact Sheet for Healthcare Providers: BankingDealers.co.za This test is not yet approved or cleared by the [place] FDA and has been authorized for detection and/or diagnosis of SARS-CoV-2 by FDA under an Emergency Use Authorization (EUA).  This EUA will remain in effect (meaning this test can be used) for the duration of the COVID-19 declaration under Section 564(b)(1) of the Act, 21 U.S.C. section 360bbb-3(b)(1), unless the  authorization is terminated or revoked sooner. Performed at [facility], [address] [place], [place] [address]   MRSA PCR Screening     Status: None   Collection Time: 02/25/19  3:52 PM  Result Value Ref Range Status   MRSA by PCR NEGATIVE NEGATIVE Final    Comment:        The GeneXpert MRSA Assay (FDA approved for NASAL specimens only), is one component of a comprehensive MRSA colonization surveillance program. It is not intended to diagnose MRSA infection nor to guide or monitor treatment for MRSA infections. Performed at [facility], [address], [place], [place] [address]          Radiology Studies: Ct Abdomen Pelvis W Contrast  Result Date: 02/25/2019 CLINICAL DATA:  Rectal bleeding with nausea and vomiting for the past 4 days. History of gastric ulcer and alcohol abuse. EXAM: CT ABDOMEN AND PELVIS WITH CONTRAST TECHNIQUE: Multidetector CT imaging of the abdomen and pelvis  was performed using the standard protocol following bolus administration of intravenous contrast. CONTRAST:  120mL OMNIPAQUE IOHEXOL 300 MG/ML  SOLN COMPARISON:  Abdominal ultrasound dated December 23, 2018. CT abdomen pelvis dated January 20, 2013. FINDINGS: Lower chest: No acute abnormality. Hepatobiliary: No focal liver abnormality is seen. No gallstones, gallbladder wall thickening, or biliary dilatation. Pancreas: Unremarkable. No pancreatic ductal dilatation or surrounding inflammatory changes. Spleen: Normal in size without focal abnormality. Adrenals/Urinary Tract: Adrenal glands are unremarkable. Kidneys are normal, without renal calculi, focal lesion, or hydronephrosis. Bladder is unremarkable for the degree of distention. Stomach/Bowel: Moderate circumferential wall thickening of the cecum and ascending colon with mild surrounding inflammatory stranding. The remaining colon is unremarkable. The stomach and small bowel are within normal limits. No obstruction. Normal appendix. Vascular/Lymphatic:  Aortic atherosclerosis. No enlarged abdominal or pelvic lymph nodes. Reproductive: Prostate is unremarkable. Other: Unchanged small fat containing umbilical hernia. No free fluid or pneumoperitoneum. Musculoskeletal: No acute or significant osseous findings. Prominent Schmorl's nodes involving the T10 and T11 superior endplates. IMPRESSION: 1. Acute colitis involving the cecum and ascending colon. 2.  Aortic atherosclerosis (ICD10-I70.0). Electronically Signed   By: Titus Dubin M.D.   On: 02/25/2019 11:52        Scheduled Meds:  Chlorhexidine Gluconate Cloth  6 each Topical Daily   diclofenac sodium  4 g Topical QID   diltiazem  60 mg Oral Q8H   metoprolol succinate  100 mg Oral Daily   pantoprazole  40 mg Oral BID AC   predniSONE  6 mg Oral Daily   sucralfate  2 g Oral Daily   Continuous Infusions:  lactated ringers 75 mL/hr at 02/27/19 0141     LOS: 2 days    Time spent: 30 minutes    Jaelie Aguilera Darleen Crocker, DO [facility] Pager [phone]  If 7PM-7AM, please contact night-coverage [email or web address] Password [facility] 02/27/2019, 7:59 AM

## 2019-02-27 NOTE — Progress Notes (Signed)
  Assessment/Plan: ADMITTED WITH NAUSEA/VOMTING/HEMATEMESIS/DIARRHEA AND AFIB W/ RVR. CLINICALLY IMPROVED. NO BM IN 24 HRS.  PLAN:  1. SUPPORTIVE CARE 2. AWAIT PATHOLOGY RESULTS 3. PROTONIX BID FOR ONE MO THEN ONCE DAILY FOREVER. 4. PEPCID PRN. [place]. 5. OPV IN 4 MOS  6. D/C ENTERIC PRECAUTIONS.   Subjective: Since I last evaluated the patient HE DENIES  NAUSEA/VOMTIING/DIARRHEA. NO CHEST PAIN OR SOB. TOLERATING POs. NO ODYNOPHAGIA. NO BM IN PAST 24 HRS. PASSING GAS.   Objective: Vital signs in last 24 hours: Vitals:   02/27/19 1034 02/27/19 1115  BP:    Pulse: 84   Resp: (!) 24   Temp:  98.8 F (37.1 C)  SpO2: 97%    General appearance: alert, cooperative and no distress Resp: clear to auscultation bilaterally Cardio: irregularly irregular rhythm GI: soft, non-tender; bowel sounds normal;   Lab Results:  K 3.6 Hb 11.2 PLT CT 180  Studies/Results: No results found.  Medications: I have reviewed the patient's current medications.

## 2019-02-28 LAB — GASTROINTESTINAL PANEL BY PCR, STOOL (REPLACES STOOL CULTURE)

## 2019-02-28 LAB — CBC
HCT: 37.7 % — ABNORMAL LOW (ref 39.0–52.0)
Hemoglobin: 12.2 g/dL — ABNORMAL LOW (ref 13.0–17.0)
MCH: 32.1 pg (ref 26.0–34.0)
MCHC: 32.4 g/dL (ref 30.0–36.0)
MCV: 99.2 fL (ref 80.0–100.0)
Platelets: 219 10*3/uL (ref 150–400)
RBC: 3.8 MIL/uL — ABNORMAL LOW (ref 4.22–5.81)
RDW: 14.1 % (ref 11.5–15.5)
WBC: 8.6 10*3/uL (ref 4.0–10.5)
nRBC: 0 % (ref 0.0–0.2)

## 2019-02-28 MED ORDER — APIXABAN 5 MG PO TABS
5.0000 mg | ORAL_TABLET | Freq: Two times a day (BID) | ORAL | Status: DC
  Administered 2019-02-28: 5 mg via ORAL
  Filled 2019-02-28: qty 1

## 2019-02-28 MED ORDER — INDOMETHACIN 25 MG PO CAPS
100.0000 mg | ORAL_CAPSULE | Freq: Once | ORAL | Status: AC
  Administered 2019-02-28: 100 mg via ORAL
  Filled 2019-02-28: qty 4

## 2019-02-28 MED ORDER — PANTOPRAZOLE SODIUM 40 MG PO TBEC: DELAYED_RELEASE_TABLET | ORAL | 6 refills | Status: DC

## 2019-02-28 MED ORDER — LORAZEPAM 0.5 MG PO TABS: 1.5000 mg | ORAL_TABLET | Freq: Every evening | ORAL | 0 refills | Status: DC | PRN

## 2019-02-28 MED ORDER — DILTIAZEM HCL ER COATED BEADS 180 MG PO CP24
180.0000 mg | ORAL_CAPSULE | Freq: Every day | ORAL | Status: DC
  Administered 2019-02-28: 180 mg via ORAL
  Filled 2019-02-28: qty 1

## 2019-02-28 MED ORDER — FAMOTIDINE 40 MG PO TABS: 40.0000 mg | ORAL_TABLET | Freq: Every day | ORAL | 3 refills | Status: DC | PRN

## 2019-02-28 NOTE — Discharge Summary (Signed)
Physician Discharge Summary  Miguel Spencer:[id number] DOB: Feb 29, 1951 DOA: 02/25/2019  PCP: [name], MD  Admit date: 02/25/2019  Discharge date: 02/28/2019  Admitted From:Home  Disposition:  Home  Recommendations for Outpatient Follow-up:  1. Follow up with PCP in 1-2 weeks and repeat CBC in 1 week to ensure stability 2. Continue home medications as noted 3. Remain on Pepcid as needed for heartburn symptoms 4. Protonix as ordered twice daily for 30 days then once daily thereafter 5. Follow-up with Dr. Oneida Alar of GI in 4 months 6. 10 tablets of Ativan prescribed as needed for anxiety at home and will need further refill by PCP in the future  Home Health: None  Equipment/Devices: None  Discharge Condition: Stable  CODE STATUS: Full  Diet recommendation: Heart Healthy  Brief/Interim Summary: Per HPI: Miguel Broaddus Sheltonis a 68 y.o.malewith medical history significant forpersistent atrial fibrillation on Eliquis, hypertension, colon polyps with prior colonoscopy 07/2018, gout, prior alcohol abuse, and questionable history of gastric ulcer who presented to the ED today with progression of GI symptoms that began this past Sunday. He states that after having Miguel Spencer on that day he began to have some nausea and vomiting that was noted to be dark with what sounds like coffee ground emesis. He noted some persistent, dull, and achy epigastric abdominal pain which worsened with anything that he ate and therefore, he has not had anything to eat over the last 2 to 3 days. He could barely tolerate any liquid intake until just yesterday when he was able to have some milk and ice water. He states that he also developed some loose, liquidy stools that were quite dark as well. He stopped taking all of his home medications to include Eliquis since Monday. He can hardly tolerate any p.o. intake and continues to have epigastric discomfort and a sense of nausea. He denies any chest pain,  shortness of breath, lightheadedness, dizziness, or crampy abdominal pain. He denies any tobacco abuse and any current alcohol use and states that he quit several months ago. He does take ibuprofen as needed for random aches and pains, but not consistently. He has apparently had a prior colonoscopy on 07/2018 with polyps noted and was due to have these repeated with Dr. Oneida Alar in September of this year. He apparently has never had an EGD.  Patient was admitted with acute GI bleed as well as atrial fibrillation with RVR on Cardizem drip. He started to feel better on the morning of 5/22 and has undergone EGD with findings of esophagitis and gastritis with shallow ulcers and no active bleeding.  He has been recommended to remain on PPI twice daily for the next 30 days and then daily thereafter.  His hemoglobin remained stable as noted below.  Eliquis has been resumed on 5/24.  He has been weaned off Cardizem drip on 5/23 and is tolerating oral medications with adequate heart rate control and blood pressure readings noted.  Heart rate currently 80 to 90 bpm.  No further symptomatology otherwise noted.  Patient is tolerating diet.  He has had 1 more bowel movement this morning.  He is complaining of some right elbow pain secondary to acute gout flare and will receive a dose of indomethacin this morning to help with the flare.  He is otherwise stable for discharge.  Discharge Diagnoses:  Active Problems:   Atrial fibrillation with rapid ventricular response (HCC)   Hypertension   Gout   History of peptic ulcer disease  Colitis   Hematemesis with nausea  Principal discharge diagnosis: Gastritis with esophagitis and duodenal ulcers and associated GI bleed with atrial fibrillation with RVR.  Discharge Instructions  Discharge Instructions    Diet - low sodium heart healthy   Complete by:  As directed    Increase activity slowly   Complete by:  As directed      Allergies as of 02/28/2019   No  Known Allergies     Medication List    STOP taking these medications   sucralfate 1 g tablet Commonly known as:  CARAFATE     TAKE these medications   apixaban 5 MG Tabs tablet Commonly known as:  Eliquis Take 1 tablet (5 mg total) by mouth 2 (two) times daily.   B-COMPLEX PO Take 1 tablet by mouth daily.   cyclobenzaprine 10 MG tablet Commonly known as:  FLEXERIL Take 10 mg by mouth at bedtime as needed for muscle spasms (for sleep).   diltiazem 180 MG 24 hr capsule Commonly known as:  CARDIZEM CD Take 1 capsule (180 mg total) by mouth daily.   famotidine 40 MG tablet Commonly known as:  PEPCID Take 1 tablet (40 mg total) by mouth daily as needed for up to 30 days for heartburn or indigestion. What changed:    when to take this  reasons to take this   FISH OIL PO Take 1 capsule by mouth daily.   INDOMETHACIN PO Take 100 mg by mouth as needed (gout).   LORazepam 0.5 MG tablet Commonly known as:  ATIVAN Take 3 tablets (1.5 mg total) by mouth at bedtime as needed for anxiety.   LYSINE PO Take 1 capsule by mouth daily.   metoprolol succinate 100 MG 24 hr tablet Commonly known as:  TOPROL-XL Take 1 tablet (100 mg total) by mouth daily. Take with or immediately following a meal.   pantoprazole 40 MG tablet Commonly known as:  PROTONIX Twice daily for 30 days, then daily thereafter.   predniSONE 1 MG tablet Commonly known as:  DELTASONE Take 6 mg by mouth daily. 7mg  total daily. Reducing by 1 dose monthly   sildenafil 100 MG tablet Commonly known as:  VIAGRA Take 100 mg by mouth daily as needed for erectile dysfunction.   Valtrex 1000 MG tablet Generic drug:  valACYclovir Take 1,000 mg by mouth as needed (cold sores).      Follow-up Information    [name], MD Follow up in 1 week(s).   Specialty:  Internal Medicine Contact information: [facility] [address] [phone]        Arnoldo Lenis, MD .   Specialty:   Cardiology Contact information: [place] [address] [phone]        [name], MD Follow up in 4 month(s).   Specialty:  Gastroenterology Contact information: [address] [place] [address] [phone]          No Known Allergies  Consultations:  GI   Procedures/Studies: Ct Abdomen Pelvis W Contrast  Result Date: 02/25/2019 CLINICAL DATA:  Rectal bleeding with nausea and vomiting for the past 4 days. History of gastric ulcer and alcohol abuse. EXAM: CT ABDOMEN AND PELVIS WITH CONTRAST TECHNIQUE: Multidetector CT imaging of the abdomen and pelvis was performed using the standard protocol following bolus administration of intravenous contrast. CONTRAST:  14mL OMNIPAQUE IOHEXOL 300 MG/ML  SOLN COMPARISON:  Abdominal ultrasound dated December 23, 2018. CT abdomen pelvis dated  January 20, 2013. FINDINGS: Lower chest: No acute abnormality. Hepatobiliary: No focal liver abnormality is seen. No gallstones, gallbladder wall thickening, or biliary dilatation. Pancreas: Unremarkable. No pancreatic ductal dilatation or surrounding inflammatory changes. Spleen: Normal in size without focal abnormality. Adrenals/Urinary Tract: Adrenal glands are unremarkable. Kidneys are normal, without renal calculi, focal lesion, or hydronephrosis. Bladder is unremarkable for the degree of distention. Stomach/Bowel: Moderate circumferential wall thickening of the cecum and ascending colon with mild surrounding inflammatory stranding. The remaining colon is unremarkable. The stomach and small bowel are within normal limits. No obstruction. Normal appendix. Vascular/Lymphatic: Aortic atherosclerosis. No enlarged abdominal or pelvic lymph nodes. Reproductive: Prostate is unremarkable. Other: Unchanged small fat containing umbilical hernia. No free fluid or pneumoperitoneum. Musculoskeletal: No acute or significant osseous findings. Prominent Schmorl's nodes involving the T10  and T11 superior endplates. IMPRESSION: 1. Acute colitis involving the cecum and ascending colon. 2.  Aortic atherosclerosis (ICD10-I70.0). Electronically Signed   By: Titus Dubin M.D.   On: 02/25/2019 11:52     Discharge Exam: Vitals:   02/27/19 2311 02/28/19 0607  BP: 115/67 (!) 122/96  Pulse: 80 63  Resp: 18 18  Temp: 98.2 F (36.8 C) 97.6 F (36.4 C)  SpO2: 98% 97%   Vitals:   02/27/19 1222 02/27/19 1309 02/27/19 2311 02/28/19 0607  BP: 107/84 93/79 115/67 (!) 122/96  Pulse: 90 75 80 63  Resp: 20 18 18 18   Temp:  99 F (37.2 C) 98.2 F (36.8 C) 97.6 F (36.4 C)  TempSrc:  Oral Oral Oral  SpO2: 99% 99% 98% 97%  Weight:  95.3 kg    Height:        General: Pt is alert, awake, not in acute distress Cardiovascular: Irregular, S1/S2 +, no rubs, no gallops Respiratory: CTA bilaterally, no wheezing, no rhonchi Abdominal: Soft, NT, ND, bowel sounds + Extremities: no edema, no cyanosis    The results of significant diagnostics from this hospitalization (including imaging, microbiology, ancillary and laboratory) are listed below for reference.     Microbiology: Recent Results (from the past 240 hour(s))  SARS Coronavirus 2 (CEPHEID - Performed in [place] hospital lab), Hosp Order     Status: None   Collection Time: 02/25/19 12:15 PM  Result Value Ref Range Status   SARS Coronavirus 2 NEGATIVE NEGATIVE Final    Comment: (NOTE) If result is NEGATIVE SARS-CoV-2 target nucleic acids are NOT DETECTED. The SARS-CoV-2 RNA is generally detectable in upper and lower  respiratory specimens during the acute phase of infection. The lowest  concentration of SARS-CoV-2 viral copies this assay can detect is 250  copies / mL. A negative result does not preclude SARS-CoV-2 infection  and should not be used as the sole basis for treatment or other  patient management decisions.  A negative result may occur with  improper specimen collection / handling, submission of specimen  other  than nasopharyngeal swab, presence of viral mutation(s) within the  areas targeted by this assay, and inadequate number of viral copies  (<250 copies / mL). A negative result must be combined with clinical  observations, patient history, and epidemiological information. If result is POSITIVE SARS-CoV-2 target nucleic acids are DETECTED. The SARS-CoV-2 RNA is generally detectable in upper and lower  respiratory specimens dur ing the acute phase of infection.  Positive  results are indicative of active infection with SARS-CoV-2.  Clinical  correlation with patient history and other diagnostic information is  necessary to determine patient infection status.  Positive results  do  not rule out bacterial infection or co-infection with other viruses. If result is PRESUMPTIVE POSTIVE SARS-CoV-2 nucleic acids MAY BE PRESENT.   A presumptive positive result was obtained on the submitted specimen  and confirmed on repeat testing.  While 2019 novel coronavirus  (SARS-CoV-2) nucleic acids may be present in the submitted sample  additional confirmatory testing may be necessary for epidemiological  and / or clinical management purposes  to differentiate between  SARS-CoV-2 and other Sarbecovirus currently known to infect humans.  If clinically indicated additional testing with an alternate test  methodology [phone]) is advised. The SARS-CoV-2 RNA is generally  detectable in upper and lower respiratory sp ecimens during the acute  phase of infection. The expected result is Negative. Fact Sheet for Patients:  StrictlyIdeas.no Fact Sheet for Healthcare Providers: BankingDealers.co.za This test is not yet approved or cleared by the [place] FDA and has been authorized for detection and/or diagnosis of SARS-CoV-2 by FDA under an Emergency Use Authorization (EUA).  This EUA will remain in effect (meaning this test can be used) for the duration  of the COVID-19 declaration under Section 564(b)(1) of the Act, 21 U.S.C. section 360bbb-3(b)(1), unless the authorization is terminated or revoked sooner. Performed at [facility], [address] [place], [place] [address]   MRSA PCR Screening     Status: None   Collection Time: 02/25/19  3:52 PM  Result Value Ref Range Status   MRSA by PCR NEGATIVE NEGATIVE Final    Comment:        The GeneXpert MRSA Assay (FDA approved for NASAL specimens only), is one component of a comprehensive MRSA colonization surveillance program. It is not intended to diagnose MRSA infection nor to guide or monitor treatment for MRSA infections. Performed at [facility], [address], [place], [place] [address]      Labs: BNP (last 3 results) No results for input(s): BNP in the last 8760 hours. Basic Metabolic Panel: Recent Labs  Lab 02/25/19 0920 02/26/19 0601 02/27/19 0316  NA 134* 133* 133*  K 3.5 3.5 3.6  CL 97* 102 102  CO2 22 21* 22  GLUCOSE 112* 95 92  BUN 31* 20 15  CREATININE 1.13 0.83 0.79  CALCIUM 9.1 7.9* 7.8*   Liver Function Tests: Recent Labs  Lab 02/25/19 0920 02/26/19 0601  AST 17 13*  ALT 18 13  ALKPHOS 43 33*  BILITOT 1.0 0.8  PROT 7.2 5.3*  ALBUMIN 3.8 2.8*   Recent Labs  Lab 02/25/19 0920  LIPASE 53*   No results for input(s): AMMONIA in the last 168 hours. CBC: Recent Labs  Lab 02/25/19 0920 02/26/19 0601 02/27/19 0316 02/28/19 0558  WBC 10.1 8.8 8.3 8.6  NEUTROABS 6.1  --   --   --   HGB 15.1 11.9* 11.2* 12.2*  HCT 43.0 35.7* 32.6* 37.7*  MCV 93.7 97.3 96.2 99.2  PLT 253 196 180 219   Cardiac Enzymes: No results for input(s): CKTOTAL, CKMB, CKMBINDEX, TROPONINI in the last 168 hours. BNP: Invalid input(s): POCBNP CBG: No results for input(s): GLUCAP in the last 168 hours. D-Dimer No results for input(s): DDIMER in the last 72 hours. Hgb A1c No results for input(s): HGBA1C in the last 72 hours. Lipid Profile No results for  input(s): CHOL, HDL, LDLCALC, TRIG, CHOLHDL, LDLDIRECT in the last 72 hours. Thyroid function studies No results for input(s): TSH, T4TOTAL, T3FREE, THYROIDAB in the last 72 hours.  Invalid input(s): FREET3 Anemia work up No results for  input(s): VITAMINB12, FOLATE, FERRITIN, TIBC, IRON, RETICCTPCT in the last 72 hours. Urinalysis    Component Value Date/Time   COLORURINE YELLOW 02/20/2013 2236   APPEARANCEUR CLEAR 02/20/2013 2236   LABSPEC 1.025 02/20/2013 2236   PHURINE 6.0 02/20/2013 2236   GLUCOSEU NEGATIVE 02/20/2013 2236   HGBUR MODERATE (A) 02/20/2013 2236   BILIRUBINUR NEGATIVE 02/20/2013 2236   KETONESUR TRACE (A) 02/20/2013 2236   PROTEINUR NEGATIVE 02/20/2013 2236   UROBILINOGEN 0.2 02/20/2013 2236   NITRITE NEGATIVE 02/20/2013 2236   LEUKOCYTESUR NEGATIVE 02/20/2013 2236   Sepsis Labs Invalid input(s): PROCALCITONIN,  WBC,  LACTICIDVEN Microbiology Recent Results (from the past 240 hour(s))  SARS Coronavirus 2 (CEPHEID - Performed in [facility] hospital lab), Hosp Order     Status: None   Collection Time: 02/25/19 12:15 PM  Result Value Ref Range Status   SARS Coronavirus 2 NEGATIVE NEGATIVE Final    Comment: (NOTE) If result is NEGATIVE SARS-CoV-2 target nucleic acids are NOT DETECTED. The SARS-CoV-2 RNA is generally detectable in upper and lower  respiratory specimens during the acute phase of infection. The lowest  concentration of SARS-CoV-2 viral copies this assay can detect is 250  copies / mL. A negative result does not preclude SARS-CoV-2 infection  and should not be used as the sole basis for treatment or other  patient management decisions.  A negative result may occur with  improper specimen collection / handling, submission of specimen other  than nasopharyngeal swab, presence of viral mutation(s) within the  areas targeted by this assay, and inadequate number of viral copies  (<250 copies / mL). A negative result must be combined with clinical   observations, patient history, and epidemiological information. If result is POSITIVE SARS-CoV-2 target nucleic acids are DETECTED. The SARS-CoV-2 RNA is generally detectable in upper and lower  respiratory specimens dur ing the acute phase of infection.  Positive  results are indicative of active infection with SARS-CoV-2.  Clinical  correlation with patient history and other diagnostic information is  necessary to determine patient infection status.  Positive results do  not rule out bacterial infection or co-infection with other viruses. If result is PRESUMPTIVE POSTIVE SARS-CoV-2 nucleic acids MAY BE PRESENT.   A presumptive positive result was obtained on the submitted specimen  and confirmed on repeat testing.  While 2019 novel coronavirus  (SARS-CoV-2) nucleic acids may be present in the submitted sample  additional confirmatory testing may be necessary for epidemiological  and / or clinical management purposes  to differentiate between  SARS-CoV-2 and other Sarbecovirus currently known to infect humans.  If clinically indicated additional testing with an alternate test  methodology [phone]) is advised. The SARS-CoV-2 RNA is generally  detectable in upper and lower respiratory sp ecimens during the acute  phase of infection. The expected result is Negative. Fact Sheet for Patients:  StrictlyIdeas.no Fact Sheet for Healthcare Providers: BankingDealers.co.za This test is not yet approved or cleared by the [place] FDA and has been authorized for detection and/or diagnosis of SARS-CoV-2 by FDA under an Emergency Use Authorization (EUA).  This EUA will remain in effect (meaning this test can be used) for the duration of the COVID-19 declaration under Section 564(b)(1) of the Act, 21 U.S.C. section 360bbb-3(b)(1), unless the authorization is terminated or revoked sooner. Performed at [facility], [address]  [place], [place] [address]   MRSA PCR Screening     Status: None   Collection Time: 02/25/19  3:52 PM  Result Value Ref Range  Status   MRSA by PCR NEGATIVE NEGATIVE Final    Comment:        The GeneXpert MRSA Assay (FDA approved for NASAL specimens only), is one component of a comprehensive MRSA colonization surveillance program. It is not intended to diagnose MRSA infection nor to guide or monitor treatment for MRSA infections. Performed at [facility], [address] [place], [place] [address]      Time coordinating discharge: 35 minutes  SIGNED:   Rodena Goldmann, DO [facility] 02/28/2019, 9:00 AM  If 7PM-7AM, please contact night-coverage [email or web address] Password [email or web address]

## 2019-02-28 NOTE — Progress Notes (Signed)
Removed IV-clean, dry, intact. Reviewed d/c paperwork with patient. Discussed new medications and gave preprinted prescription for pepcid. Wheeled stable patient and belongings to Short Stay entrance where she was picked up by friend.

## 2019-03-02 ENCOUNTER — Encounter (HOSPITAL_COMMUNITY): Payer: Self-pay | Admitting: Gastroenterology

## 2019-03-02 DIAGNOSIS — R768 Other specified abnormal immunological findings in serum: Secondary | ICD-10-CM | POA: Diagnosis not present

## 2019-03-02 DIAGNOSIS — M353 Polymyalgia rheumatica: Secondary | ICD-10-CM | POA: Diagnosis not present

## 2019-03-02 DIAGNOSIS — K922 Gastrointestinal hemorrhage, unspecified: Secondary | ICD-10-CM | POA: Diagnosis not present

## 2019-03-02 DIAGNOSIS — Z7952 Long term (current) use of systemic steroids: Secondary | ICD-10-CM | POA: Diagnosis not present

## 2019-03-02 DIAGNOSIS — F1020 Alcohol dependence, uncomplicated: Secondary | ICD-10-CM | POA: Diagnosis not present

## 2019-03-02 DIAGNOSIS — M7061 Trochanteric bursitis, right hip: Secondary | ICD-10-CM | POA: Diagnosis not present

## 2019-03-03 ENCOUNTER — Other Ambulatory Visit: Payer: Self-pay

## 2019-03-03 ENCOUNTER — Inpatient Hospital Stay (HOSPITAL_COMMUNITY)
Admission: EM | Admit: 2019-03-03 | Discharge: 2019-03-10 | DRG: 394 | Disposition: A | Discharge status: Home / Self Care | Payer: Medicare HMO | Attending: Internal Medicine | Admitting: Internal Medicine

## 2019-03-03 ENCOUNTER — Encounter (HOSPITAL_COMMUNITY): Payer: Self-pay | Admitting: Emergency Medicine

## 2019-03-03 ENCOUNTER — Telehealth: Payer: Self-pay

## 2019-03-03 DIAGNOSIS — D62 Acute posthemorrhagic anemia: Secondary | ICD-10-CM | POA: Diagnosis present

## 2019-03-03 DIAGNOSIS — Z8711 Personal history of peptic ulcer disease: Secondary | ICD-10-CM | POA: Diagnosis not present

## 2019-03-03 DIAGNOSIS — K219 Gastro-esophageal reflux disease without esophagitis: Secondary | ICD-10-CM | POA: Diagnosis present

## 2019-03-03 DIAGNOSIS — K922 Gastrointestinal hemorrhage, unspecified: Secondary | ICD-10-CM | POA: Diagnosis present

## 2019-03-03 DIAGNOSIS — I700 Atherosclerosis of aorta: Secondary | ICD-10-CM | POA: Diagnosis not present

## 2019-03-03 DIAGNOSIS — Z8249 Family history of ischemic heart disease and other diseases of the circulatory system: Secondary | ICD-10-CM | POA: Diagnosis not present

## 2019-03-03 DIAGNOSIS — Z86010 Personal history of colonic polyps: Secondary | ICD-10-CM

## 2019-03-03 DIAGNOSIS — I771 Stricture of artery: Secondary | ICD-10-CM | POA: Diagnosis not present

## 2019-03-03 DIAGNOSIS — M109 Gout, unspecified: Secondary | ICD-10-CM | POA: Diagnosis present

## 2019-03-03 DIAGNOSIS — Z79899 Other long term (current) drug therapy: Secondary | ICD-10-CM | POA: Diagnosis not present

## 2019-03-03 DIAGNOSIS — Z20828 Contact with and (suspected) exposure to other viral communicable diseases: Secondary | ICD-10-CM | POA: Diagnosis present

## 2019-03-03 DIAGNOSIS — N3289 Other specified disorders of bladder: Secondary | ICD-10-CM | POA: Diagnosis not present

## 2019-03-03 DIAGNOSIS — M438X4 Other specified deforming dorsopathies, thoracic region: Secondary | ICD-10-CM | POA: Diagnosis not present

## 2019-03-03 DIAGNOSIS — K529 Noninfective gastroenteritis and colitis, unspecified: Secondary | ICD-10-CM | POA: Diagnosis present

## 2019-03-03 DIAGNOSIS — K5730 Diverticulosis of large intestine without perforation or abscess without bleeding: Secondary | ICD-10-CM | POA: Diagnosis present

## 2019-03-03 DIAGNOSIS — I701 Atherosclerosis of renal artery: Secondary | ICD-10-CM | POA: Diagnosis not present

## 2019-03-03 DIAGNOSIS — F411 Generalized anxiety disorder: Secondary | ICD-10-CM | POA: Diagnosis present

## 2019-03-03 DIAGNOSIS — K559 Vascular disorder of intestine, unspecified: Secondary | ICD-10-CM | POA: Diagnosis not present

## 2019-03-03 DIAGNOSIS — I10 Essential (primary) hypertension: Secondary | ICD-10-CM | POA: Diagnosis present

## 2019-03-03 DIAGNOSIS — M609 Myositis, unspecified: Secondary | ICD-10-CM | POA: Diagnosis present

## 2019-03-03 DIAGNOSIS — R9431 Abnormal electrocardiogram [ECG] [EKG]: Secondary | ICD-10-CM | POA: Diagnosis present

## 2019-03-03 DIAGNOSIS — K921 Melena: Secondary | ICD-10-CM | POA: Diagnosis not present

## 2019-03-03 DIAGNOSIS — Z03818 Encounter for observation for suspected exposure to other biological agents ruled out: Secondary | ICD-10-CM | POA: Diagnosis not present

## 2019-03-03 DIAGNOSIS — D649 Anemia, unspecified: Secondary | ICD-10-CM | POA: Diagnosis present

## 2019-03-03 DIAGNOSIS — K625 Hemorrhage of anus and rectum: Secondary | ICD-10-CM | POA: Diagnosis present

## 2019-03-03 DIAGNOSIS — K6389 Other specified diseases of intestine: Secondary | ICD-10-CM | POA: Diagnosis not present

## 2019-03-03 DIAGNOSIS — K429 Umbilical hernia without obstruction or gangrene: Secondary | ICD-10-CM | POA: Diagnosis present

## 2019-03-03 DIAGNOSIS — I480 Paroxysmal atrial fibrillation: Secondary | ICD-10-CM

## 2019-03-03 DIAGNOSIS — I959 Hypotension, unspecified: Secondary | ICD-10-CM | POA: Diagnosis not present

## 2019-03-03 DIAGNOSIS — M1990 Unspecified osteoarthritis, unspecified site: Secondary | ICD-10-CM | POA: Diagnosis present

## 2019-03-03 DIAGNOSIS — K633 Ulcer of intestine: Secondary | ICD-10-CM | POA: Diagnosis present

## 2019-03-03 DIAGNOSIS — Z7901 Long term (current) use of anticoagulants: Secondary | ICD-10-CM | POA: Diagnosis not present

## 2019-03-03 DIAGNOSIS — K635 Polyp of colon: Secondary | ICD-10-CM | POA: Diagnosis present

## 2019-03-03 DIAGNOSIS — K828 Other specified diseases of gallbladder: Secondary | ICD-10-CM | POA: Diagnosis not present

## 2019-03-03 DIAGNOSIS — R58 Hemorrhage, not elsewhere classified: Secondary | ICD-10-CM | POA: Diagnosis not present

## 2019-03-03 DIAGNOSIS — K551 Chronic vascular disorders of intestine: Secondary | ICD-10-CM | POA: Diagnosis not present

## 2019-03-03 HISTORY — DX: Alcohol dependence, uncomplicated: F10.20

## 2019-03-03 HISTORY — DX: Gastrointestinal hemorrhage, unspecified: K92.2

## 2019-03-03 HISTORY — DX: Polyp of colon: K63.5

## 2019-03-03 LAB — TYPE AND SCREEN
ABO/RH(D): O POS
Antibody Screen: NEGATIVE

## 2019-03-03 LAB — CBC WITH DIFFERENTIAL/PLATELET
Abs Immature Granulocytes: 0.36 10*3/uL — ABNORMAL HIGH (ref 0.00–0.07)
Basophils Absolute: 0 10*3/uL (ref 0.0–0.1)
Basophils Relative: 0 %
Eosinophils Absolute: 0.1 10*3/uL (ref 0.0–0.5)
Eosinophils Relative: 1 %
HCT: 26.5 % — ABNORMAL LOW (ref 39.0–52.0)
Hemoglobin: 8.8 g/dL — ABNORMAL LOW (ref 13.0–17.0)
Immature Granulocytes: 4 %
Lymphocytes Relative: 17 %
Lymphs Abs: 1.5 10*3/uL (ref 0.7–4.0)
MCH: 32 pg (ref 26.0–34.0)
MCHC: 33.2 g/dL (ref 30.0–36.0)
MCV: 96.4 fL (ref 80.0–100.0)
Monocytes Absolute: 1 10*3/uL (ref 0.1–1.0)
Monocytes Relative: 12 %
Neutro Abs: 5.8 10*3/uL (ref 1.7–7.7)
Neutrophils Relative %: 66 %
Platelets: 295 10*3/uL (ref 150–400)
RBC: 2.75 MIL/uL — ABNORMAL LOW (ref 4.22–5.81)
RDW: 13.8 % (ref 11.5–15.5)
WBC: 8.7 10*3/uL (ref 4.0–10.5)
nRBC: 0 % (ref 0.0–0.2)

## 2019-03-03 LAB — COMPREHENSIVE METABOLIC PANEL
ALT: 24 U/L (ref 0–44)
AST: 16 U/L (ref 15–41)
Albumin: 3.2 g/dL — ABNORMAL LOW (ref 3.5–5.0)
Alkaline Phosphatase: 50 U/L (ref 38–126)
Anion gap: 8 (ref 5–15)
BUN: 20 mg/dL (ref 8–23)
CO2: 23 mmol/L (ref 22–32)
Calcium: 8.3 mg/dL — ABNORMAL LOW (ref 8.9–10.3)
Chloride: 104 mmol/L (ref 98–111)
Creatinine, Ser: 1.18 mg/dL (ref 0.61–1.24)
GFR calc Af Amer: >60 mL/min (ref >60)
GFR calc non Af Amer: >60 mL/min (ref >60)
Glucose, Bld: 118 mg/dL — ABNORMAL HIGH (ref 70–99)
Potassium: 4 mmol/L (ref 3.5–5.1)
Sodium: 135 mmol/L (ref 135–145)
Total Bilirubin: 0.7 mg/dL (ref 0.3–1.2)
Total Protein: 6.1 g/dL — ABNORMAL LOW (ref 6.5–8.1)

## 2019-03-03 LAB — POC OCCULT BLOOD, ED: Fecal Occult Bld: POSITIVE — AB

## 2019-03-03 LAB — PROTIME-INR
INR: 1.7 — ABNORMAL HIGH (ref 0.8–1.2)
Prothrombin Time: 19.6 seconds — ABNORMAL HIGH (ref 11.4–15.2)

## 2019-03-03 LAB — SARS CORONAVIRUS 2 BY RT PCR (HOSPITAL ORDER, PERFORMED IN ~~LOC~~ HOSPITAL LAB): SARS Coronavirus 2: NEGATIVE

## 2019-03-03 MED ORDER — SODIUM CHLORIDE 0.9% FLUSH: 3.0000 mL | INTRAVENOUS | Status: DC | PRN

## 2019-03-03 MED ORDER — ACETAMINOPHEN 325 MG PO TABS
650.0000 mg | ORAL_TABLET | Freq: Four times a day (QID) | ORAL | Status: DC | PRN
  Administered 2019-03-04: 650 mg via ORAL
  Filled 2019-03-03: qty 2

## 2019-03-03 MED ORDER — DILTIAZEM HCL ER COATED BEADS 180 MG PO CP24
180.0000 mg | ORAL_CAPSULE | Freq: Every day | ORAL | Status: DC
  Administered 2019-03-04 – 2019-03-10 (×7): 180 mg via ORAL
  Filled 2019-03-03 (×7): qty 1

## 2019-03-03 MED ORDER — SODIUM CHLORIDE 0.9 % IV BOLUS
1000.0000 mL | Freq: Once | INTRAVENOUS | Status: AC
  Administered 2019-03-03: 1000 mL via INTRAVENOUS

## 2019-03-03 MED ORDER — SODIUM CHLORIDE 0.9 % IV SOLN
80.0000 mg | Freq: Once | INTRAVENOUS | Status: AC
  Administered 2019-03-03: 80 mg via INTRAVENOUS
  Filled 2019-03-03: qty 80

## 2019-03-03 MED ORDER — SODIUM CHLORIDE 0.9 % IV SOLN
8.0000 mg/h | INTRAVENOUS | Status: DC
  Administered 2019-03-03 – 2019-03-05 (×5): 8 mg/h via INTRAVENOUS
  Filled 2019-03-03 (×8): qty 80

## 2019-03-03 MED ORDER — SODIUM CHLORIDE 0.9 % IV SOLN
INTRAVENOUS | Status: AC
  Administered 2019-03-03 (×2): via INTRAVENOUS

## 2019-03-03 MED ORDER — SODIUM CHLORIDE 0.9% FLUSH
3.0000 mL | Freq: Two times a day (BID) | INTRAVENOUS | Status: DC
  Administered 2019-03-04 – 2019-03-07 (×7): 3 mL via INTRAVENOUS

## 2019-03-03 MED ORDER — METOPROLOL SUCCINATE ER 50 MG PO TB24
100.0000 mg | ORAL_TABLET | Freq: Every day | ORAL | Status: DC
  Administered 2019-03-04 – 2019-03-10 (×6): 100 mg via ORAL
  Filled 2019-03-03 (×7): qty 2

## 2019-03-03 MED ORDER — LORAZEPAM 1 MG PO TABS
1.5000 mg | ORAL_TABLET | Freq: Every evening | ORAL | Status: DC | PRN
  Administered 2019-03-03 – 2019-03-08 (×5): 1.5 mg via ORAL
  Filled 2019-03-03 (×5): qty 1

## 2019-03-03 MED ORDER — PROCHLORPERAZINE EDISYLATE 10 MG/2ML IJ SOLN: 10.0000 mg | Freq: Four times a day (QID) | INTRAMUSCULAR | Status: DC | PRN

## 2019-03-03 MED ORDER — COLCHICINE 0.6 MG PO TABS
0.6000 mg | ORAL_TABLET | Freq: Every day | ORAL | Status: AC
  Administered 2019-03-04 – 2019-03-05 (×2): 0.6 mg via ORAL
  Filled 2019-03-03 (×2): qty 1

## 2019-03-03 MED ORDER — PANTOPRAZOLE SODIUM 40 MG IV SOLR
INTRAVENOUS | Status: AC
  Filled 2019-03-03: qty 160

## 2019-03-03 MED ORDER — SODIUM CHLORIDE 0.9 % IV SOLN: 250.0000 mL | INTRAVENOUS | Status: DC | PRN

## 2019-03-03 MED ORDER — ACETAMINOPHEN 650 MG RE SUPP: 650.0000 mg | Freq: Four times a day (QID) | RECTAL | Status: DC | PRN

## 2019-03-03 NOTE — Telephone Encounter (Signed)
PLEASE CALL PT. HE SHOULD GO TO THE ED TO [facility] FOR BLOODY DIARRHEA.

## 2019-03-03 NOTE — ED Notes (Signed)
EKG performed and seen by Dr Lacinda Axon

## 2019-03-03 NOTE — Telephone Encounter (Signed)
REVIEWED-NO ADDITIONAL RECOMMENDATIONS. 

## 2019-03-03 NOTE — Telephone Encounter (Signed)
PT called and is aware, said he will try to go a little later today.

## 2019-03-03 NOTE — Telephone Encounter (Signed)
Left message on VM that Dr. Oneida Alar said he should go to the ED.

## 2019-03-03 NOTE — Telephone Encounter (Signed)
Pt is aware of the results. He said he will get an ambulance to take him to the hospital so hopefully he can get seen quicker. Said he is weak and he just doesn't feel like driving there and sitting so long. I told him to call right away since he feels weak and he said that he would.

## 2019-03-03 NOTE — Telephone Encounter (Signed)
Pt said he was in hospital recently and had EGD done also ( 02/26/2019). He is at home and back on his meds for his heart ( including blood thinner) and other meds. He had some gout in elbow while in the hospital and they would not give him his medicine for that, said it could go against him. But when he got home he took some Indocin that he had on hand. He is having diarrhea and it looks like all blood. He was up most of the night last night and going to the bathroom frequently. He has eaten very little, yesterday had two boiled eggs for breakfast and cheese and crackers for dinner last evening. He is drinking water. He would like to know what Dr. Oneida Alar advises for him. He said he really does not want to go back to the hospital. He really likes Dr. Oneida Alar a lot and said she is a wonderful doctor. He is aware Dr. Oneida Alar is here today and I will let her know for recommendations.

## 2019-03-03 NOTE — ED Provider Notes (Addendum)
[facility] EMERGENCY DEPARTMENT Provider Note   CSN: [id number] Arrival date & time: 03/03/19  1537    History   Chief Complaint Chief Complaint  Patient presents with  . GI Bleeding    HPI Miguel Spencer is a 68 y.o. male.     Level 5 caveat for acuity of condition.  Chief complaint maroon stool 7-8 toes today.  Recent admission 5/21-5/24 for rectal bleeding.  Patient had been taking indomethacin for his gout.  Dr. Oneida Alar performed an EGD which apparently revealed minimal ulcer disease.  No colonoscopy performed.  He is now back on his Eliquis.  New Rx for Protonix.  His hemoglobin on 5/24 was 12.2.     Past Medical History:  Diagnosis Date  . A-fib ([place])   . A-fib ([place])   . Alcohol abuse   . Alcoholic ([place])   . Anxiety   . Colon polyp   . Depression   . GI bleed   . Gout   . Hypertension   . Vertigo     Patient Active Problem List   Diagnosis Date Noted  . Hematemesis with nausea   . Colitis 02/25/2019  . Atrial fibrillation with RVR ([place])   . Melena   . Heme + stool   . Alcoholism ([place])   . Diarrhea of presumed infectious origin   . Hepatic cirrhosis ([place]) 02/15/2019  . History of colonic polyps 12/15/2018  . History of peptic ulcer disease 12/15/2018  . Atrial fibrillation with rapid ventricular response ([place]) 08/13/2018  . Depression 08/13/2018  . Anxiety 08/13/2018  . Hypertension 08/13/2018  . Gout 08/13/2018  . Anemia 08/13/2018  . Alcohol abuse 08/13/2018  . Elevated lipase 08/13/2018  . Prolonged QT interval 08/13/2018  . Alcohol withdrawal ([place]) 08/13/2018    Past Surgical History:  Procedure Laterality Date  . BIOPSY  02/26/2019   Procedure: BIOPSY;  Surgeon: [name], MD;  Location: [facility] END[facility];  Service: Endoscopy;;  Gastric   . COLONOSCOPY    . ESOPHAGOGASTRODUODENOSCOPY (EGD) WITH PROPOFOL N/A 02/26/2019   Procedure: ESOPHAGOGASTRODUODENOSCOPY (EGD) WITH PROPOFOL;  Surgeon: [name], MD;  Location: [facility] ENDO SU[facility];   Service: Endoscopy;  Laterality: N/A;  . HERNIA REPAIR          Home Medications    Prior to Admission medications   Medication Sig Start Date End Date Taking? Authorizing Provider  apixaban (ELIQUIS) 5 MG TABS tablet Take 1 tablet (5 mg total) by mouth 2 (two) times daily. 01/04/19  Yes BranchAlphonse Guild, MD  B Complex-Biotin-FA (B-COMPLEX PO) Take 1 tablet by mouth daily.    [provider]  cyclobenzaprine (FLEXERIL) 10 MG tablet Take 10 mg by mouth at bedtime as needed for muscle spasms (for sleep).  01/16/19   [provider]  diltiazem (CARDIZEM CD) 180 MG 24 hr capsule Take 1 capsule (180 mg total) by mouth daily. 08/15/18   Orson Eva, MD  famotidine (PEPCID) 40 MG tablet Take 1 tablet (40 mg total) by mouth daily as needed for up to 30 days for heartburn or indigestion. 02/28/19 03/30/19  Manuella Ghazi, Pratik D, DO  INDOMETHACIN PO Take 100 mg by mouth as needed (gout).     [provider]  LORazepam (ATIVAN) 0.5 MG tablet Take 3 tablets (1.5 mg total) by mouth at bedtime as needed for anxiety. 02/28/19   Manuella Ghazi, Pratik D, DO  LYSINE PO Take 1 capsule by mouth daily.    [provider]  metoprolol  succinate (TOPROL-XL) 100 MG 24 hr tablet Take 1 tablet (100 mg total) by mouth daily. Take with or immediately following a meal. 08/15/18   Tat, Shanon Brow, MD  Omega-3 Fatty Acids (FISH OIL PO) Take 1 capsule by mouth daily.    [provider]  pantoprazole (PROTONIX) 40 MG tablet Twice daily for 30 days, then daily thereafter. 02/28/19   Manuella Ghazi, Pratik D, DO  predniSONE (DELTASONE) 1 MG tablet Take 6 mg by mouth daily. 7mg  total daily. Reducing by 1 dose monthly 11/26/18   [provider]  sildenafil (VIAGRA) 100 MG tablet Take 100 mg by mouth daily as needed for erectile dysfunction.    [provider]  valACYclovir (VALTREX) 1000 MG tablet Take 1,000 mg by mouth as needed (cold sores).     [provider]    Family History Family  History  Problem Relation Age of Onset  . Cancer Mother   . Heart disease Father   . Heart disease Brother   . Heart disease Other   . Colon cancer Neg Hx   . Gastric cancer Neg Hx   . Esophageal cancer Neg Hx     Social History Social History   Tobacco Use  . Smoking status: Never Smoker  . Smokeless tobacco: Never Used  Substance Use Topics  . Alcohol use: Not Currently    Comment: No ETOH 6-8 weeks (as of 02/15/19). "alcoholic all my life"; Currently drinks all day for 7 days straight 1-2 times (weeks) a month  . Drug use: No     Allergies   Patient has no known allergies.   Review of Systems Review of Systems  Unable to perform ROS: Acuity of condition     Physical Exam Updated Vital Signs BP 112/80   Pulse 65   Resp 20   Ht 6' (1.829 m)   Wt 95.3 kg   SpO2 100%   BMI 28.48 kg/m   Physical Exam Vitals signs and nursing note reviewed.  Constitutional:      Appearance: He is well-developed.  HENT:     Head: Normocephalic and atraumatic.  Eyes:     Conjunctiva/sclera: Conjunctivae normal.  Neck:     Musculoskeletal: Neck supple.  Cardiovascular:     Rate and Rhythm: Normal rate and regular rhythm.  Pulmonary:     Effort: Pulmonary effort is normal.     Breath sounds: Normal breath sounds.  Abdominal:     General: Bowel sounds are normal.     Palpations: Abdomen is soft.     Comments: Nontender.  Genitourinary:    Comments: Rectal exam: Maroon/brown stool.  Heme positive. Musculoskeletal: Normal range of motion.  Skin:    General: Skin is warm and dry.  Neurological:     Mental Status: He is alert and oriented to person, place, and time.  Psychiatric:        Behavior: Behavior normal.      ED Treatments / Results  Labs (all labs ordered are listed, but only abnormal results are displayed) Labs Reviewed  CBC WITH DIFFERENTIAL/PLATELET - Abnormal; Notable for the following components:      Result Value   RBC 2.75 (*)    Hemoglobin 8.8  (*)    HCT 26.5 (*)    Abs Immature Granulocytes 0.36 (*)    All other components within normal limits  COMPREHENSIVE METABOLIC PANEL - Abnormal; Notable for the following components:   Glucose, Bld 118 (*)    Calcium 8.3 (*)  Total Protein 6.1 (*)    Albumin 3.2 (*)    All other components within normal limits  PROTIME-INR - Abnormal; Notable for the following components:   Prothrombin Time 19.6 (*)    INR 1.7 (*)    All other components within normal limits  POC OCCULT BLOOD, ED - Abnormal; Notable for the following components:   Fecal Occult Bld POSITIVE (*)    All other components within normal limits  TYPE AND SCREEN    EKG None  Radiology No results found.  Procedures Procedures (including critical care time)  Medications Ordered in ED Medications  sodium chloride 0.9 % bolus 1,000 mL (1,000 mLs Intravenous New Bag/Given 03/03/19 1628)     Initial Impression / Assessment and Plan / ED Course  I have reviewed the triage vital signs and the nursing notes.  Pertinent labs & imaging results that were available during my care of the patient were reviewed by me and considered in my medical decision making (see chart for details).        Patient presents with maroon stool.  Hemoglobin has dropped significantly.  Will Rx, PPI, consult GI and admit to general medicine. 1745:  Disc c Dr Gala Romney.  Will see in the morning.  CRITICAL CARE Performed by: Nat Christen Total critical care time: 35 minutes Critical care time was exclusive of separately billable procedures and treating other patients. Critical care was necessary to treat or prevent imminent or life-threatening deterioration. Critical care was time spent personally by me on the following activities: development of treatment plan with patient and/or surrogate as well as nursing, discussions with consultants, evaluation of patient's response to treatment, examination of patient, obtaining history from patient or  surrogate, ordering and performing treatments and interventions, ordering and review of laboratory studies, ordering and review of radiographic studies, pulse oximetry and re-evaluation of patient's condition.  Final Clinical Impressions(s) / ED Diagnoses   Final diagnoses:  Rectal bleeding    ED Discharge Orders    None       Nat Christen, MD 03/03/19 [place]    Nat Christen, MD 03/03/19 [phone]

## 2019-03-03 NOTE — ED Triage Notes (Signed)
Pt c/o maroon colored BM x7-8 today with generalized weakness and lightheadedness. PT states recent admission for GI bleed and had endoscopy. PT states he is scheduled for colonoscopy with Dr. Oneida Alar soon as well. PT states he started back his Eliquis at discharge and had a dose this am.

## 2019-03-03 NOTE — Telephone Encounter (Signed)
Please call pt. His STOMACH BIOPSIES ARE negative FOR H pylori infection. THE GASTRITIS IS DUE TO INDOCIN.

## 2019-03-03 NOTE — H&P (Addendum)
TRH H&P    Patient Demographics:    Miguel Spencer, is a 68 y.o. male  MRN: [id number]  DOB - [birth year]  Admit Date - 03/03/2019  Referring MD/NP/PA: Idalia Needle  Outpatient Primary MD for the patient is [name], MD  Patient coming from:  home  Chief complaint-  Rectal bleeding   HPI:    Miguel Spencer  is a 68 y.o. male, w Hypertension, Pafib (chads2vasc=2) , h/o Gout, recent admission for GI bleeding/coffee ground emesis 02/25/19.  Pt was discharged on 5/24 and apparently since then has had loose liquid stool with rectal bleeding.  pt notes he resumed his Eliquis on discharge and took 5 indomethacin for gout attack while at home.  Pt states has been compliant with protonix/pepcid.   Pt denies fever, chills, cp, palp, sob, n/v, abd pain, black stool.    Review of prior hospitalization showed  CT scan abd/ pelvis on 02/25/19=> acute colitis involving cecum and ascending colon  EGD 02/26/19=> esophagitis, gastritis with shallow ulcers but no active bleeding.    Wbc 8.7 Hgb 8.8 (previously 12.2), Plt 295 Na 135, K 4.0, Bun 20, Creatinine 1.18 Alb 3.2 Ast 16, Alt 24 INR 1.7  Type and screen done  protonix 80mg  iv x1 and 8mg /hr started in ED.   GI apparently consulted by ED, ED spoke with Dr. Sydell Axon and will evaluate in AM.   Pt will be admitted for rectal bleeding and diarrhea.        Review of systems:    In addition to the HPI above,  No Fever-chills, No Headache, No changes with Vision or hearing, No problems swallowing food or Liquids, No Chest pain, Cough or Shortness of Breath, No Abdominal pain, No Vomiting, No Blood in Urine, No dysuria, No new skin rashes or bruises, No new joints pains-aches,  No new weakness, tingling, numbness in any extremity, No recent weight gain or loss, No polyuria, polydypsia or polyphagia, No significant Mental Stressors.  All other systems reviewed  and are negative.    Past History of the following :    Past Medical History:  Diagnosis Date  . A-fib ([place])   . A-fib ([place])   . Alcohol abuse   . Alcoholic ([place])   . Anxiety   . Colon polyp   . Depression   . GI bleed   . Gout   . Hypertension   . Vertigo       Past Surgical History:  Procedure Laterality Date  . BIOPSY  02/26/2019   Procedure: BIOPSY;  Surgeon: [name], MD;  Location[facility];  Service: Endoscopy;;  Gastric   . COLONOSCOPY    . ESOPHAGOGASTRODUODENOSCOPY (EGD) WITH PROPOFOL N/A 02/26/2019   Procedure: ESOPHAGOGASTRODUODENOSCOPY (EGD) WITH PROPOFOL;  Surgeon: [name], MD;  Location[facility];  Service: Endoscopy;  Laterality: N/A;  . HERNIA REPAIR        Social History:      Social History   Tobacco Use  . Smoking status: Never Smoker  .  Smokeless tobacco: Never Used  Substance Use Topics  . Alcohol use: Not Currently    Comment: No ETOH 6-8 weeks (as of 02/15/19). "alcoholic all my life"; Currently drinks all day for 7 days straight 1-2 times (weeks) a month       Family History :     Family History  Problem Relation Age of Onset  . Cancer Mother   . Heart disease Father   . Heart disease Brother   . Heart disease Other   . Colon cancer Neg Hx   . Gastric cancer Neg Hx   . Esophageal cancer Neg Hx       Home Medications:   Prior to Admission medications   Medication Sig Start Date End Date Taking? Authorizing Provider  apixaban (ELIQUIS) 5 MG TABS tablet Take 1 tablet (5 mg total) by mouth 2 (two) times daily. 01/04/19  Yes BranchAlphonse Guild, MD  B Complex-Biotin-FA (B-COMPLEX PO) Take 1 tablet by mouth daily.    [provider]  cyclobenzaprine (FLEXERIL) 10 MG tablet Take 10 mg by mouth at bedtime as needed for muscle spasms (for sleep).  01/16/19   [provider]  diltiazem (CARDIZEM CD) 180 MG 24 hr capsule Take 1 capsule (180 mg total) by mouth daily. 08/15/18   Orson Eva, MD   famotidine (PEPCID) 40 MG tablet Take 1 tablet (40 mg total) by mouth daily as needed for up to 30 days for heartburn or indigestion. 02/28/19 03/30/19  Manuella Ghazi, Pratik D, DO  INDOMETHACIN PO Take 100 mg by mouth as needed (gout).     [provider]  LORazepam (ATIVAN) 0.5 MG tablet Take 3 tablets (1.5 mg total) by mouth at bedtime as needed for anxiety. 02/28/19   Manuella Ghazi, Pratik D, DO  LYSINE PO Take 1 capsule by mouth daily.    [provider]  metoprolol succinate (TOPROL-XL) 100 MG 24 hr tablet Take 1 tablet (100 mg total) by mouth daily. Take with or immediately following a meal. 08/15/18   Tat, Shanon Brow, MD  Omega-3 Fatty Acids (FISH OIL PO) Take 1 capsule by mouth daily.    [provider]  pantoprazole (PROTONIX) 40 MG tablet Twice daily for 30 days, then daily thereafter. 02/28/19   Manuella Ghazi, Pratik D, DO  predniSONE (DELTASONE) 1 MG tablet Take 6 mg by mouth daily. 7mg  total daily. Reducing by 1 dose monthly 11/26/18   [provider]  sildenafil (VIAGRA) 100 MG tablet Take 100 mg by mouth daily as needed for erectile dysfunction.    [provider]  valACYclovir (VALTREX) 1000 MG tablet Take 1,000 mg by mouth as needed (cold sores).     [provider]     Allergies:    No Known Allergies   Physical Exam:   Vitals  Blood pressure 112/80, pulse 65, resp. rate 20, height 6' (1.829 m), weight 95.3 kg, SpO2 100 %.  1.  General: axox3  2. Psychiatric: euthymic  3. Neurologic: cn2-12 intact, reflexes 2+ symmetric, diffuse with no clonus, motor 5/5 in all 4 ext  4. HEENMT:  Anicteric, pink conjunctiva, pupils 1.6mm symmetric, direct, consensual intact Mmm Neck: no jvd  5. Respiratory : CTAB  6. Cardiovascular : Irr, irr, s1, s2, 2/6 sem apex  7. Gastrointestinal:  Abd: soft, nt, nd, +bs  8. Skin:  Ext: no c/c/e,  No rash  9.Musculoskeletal:  Good ROM   No adenopathy   Data Review:    CBC Recent Labs  Lab 02/25/19 0920  02/26/19 0601 02/27/19 0316 02/28/19 0558 03/03/19 1611  WBC 10.1 8.8 8.3 8.6 8.7  HGB 15.1 11.9* 11.2* 12.2* 8.8*  HCT 43.0 35.7* 32.6* 37.7* 26.5*  PLT 253 196 180 219 295  MCV 93.7 97.3 96.2 99.2 96.4  MCH 32.9 32.4 33.0 32.1 32.0  MCHC 35.1 33.3 34.4 32.4 33.2  RDW 13.9 14.0 13.9 14.1 13.8  LYMPHSABS 2.3  --   --   --  1.5  MONOABS 1.3*  --   --   --  1.0  EOSABS 0.0  --   --   --  0.1  BASOSABS 0.1  --   --   --  0.0   ------------------------------------------------------------------------------------------------------------------  Results for orders placed or performed during the hospital encounter of 03/03/19 (from the past 48 hour(s))  POC occult blood, ED Provider will collect     Status: Abnormal   Collection Time: 03/03/19  4:03 PM  Result Value Ref Range   Fecal Occult Bld POSITIVE (A) NEGATIVE  CBC with Differential     Status: Abnormal   Collection Time: 03/03/19  4:11 PM  Result Value Ref Range   WBC 8.7 4.0 - 10.5 K/uL   RBC 2.75 (L) 4.22 - 5.81 MIL/uL   Hemoglobin 8.8 (L) 13.0 - 17.0 g/dL   HCT 26.5 (L) 39.0 - 52.0 %   MCV 96.4 80.0 - 100.0 fL   MCH 32.0 26.0 - 34.0 pg   MCHC 33.2 30.0 - 36.0 g/dL   RDW 13.8 11.5 - 15.5 %   Platelets 295 150 - 400 K/uL   nRBC 0.0 0.0 - 0.2 %   Neutrophils Relative % 66 %   Neutro Abs 5.8 1.7 - 7.7 K/uL   Lymphocytes Relative 17 %   Lymphs Abs 1.5 0.7 - 4.0 K/uL   Monocytes Relative 12 %   Monocytes Absolute 1.0 0.1 - 1.0 K/uL   Eosinophils Relative 1 %   Eosinophils Absolute 0.1 0.0 - 0.5 K/uL   Basophils Relative 0 %   Basophils Absolute 0.0 0.0 - 0.1 K/uL   Immature Granulocytes 4 %   Abs Immature Granulocytes 0.36 (H) 0.00 - 0.07 K/uL    Comment: Performed at [facility], [address], [place], [place] [address]  Comprehensive metabolic panel     Status: Abnormal   Collection Time: 03/03/19  4:11 PM  Result Value Ref Range   Sodium 135 135 - 145 mmol/L   Potassium 4.0 3.5 - 5.1 mmol/L   Chloride 104 98 -  111 mmol/L   CO2 23 22 - 32 mmol/L   Glucose, Bld 118 (H) 70 - 99 mg/dL   BUN 20 8 - 23 mg/dL   Creatinine, Ser 1.18 0.61 - 1.24 mg/dL   Calcium 8.3 (L) 8.9 - 10.3 mg/dL   Total Protein 6.1 (L) 6.5 - 8.1 g/dL   Albumin 3.2 (L) 3.5 - 5.0 g/dL   AST 16 15 - 41 U/L   ALT 24 0 - 44 U/L   Alkaline Phosphatase 50 38 - 126 U/L   Total Bilirubin 0.7 0.3 - 1.2 mg/dL   GFR calc non Af Amer >60 >60 mL/min   GFR calc Af Amer >60 >60 mL/min   Anion gap 8 5 - 15    Comment: Performed at [facility], [address], [place], [place] [address]  Type and screen [facility]     Status: None   Collection Time: 03/03/19  4:11 PM  Result Value Ref Range  ABO/RH(D) O POS    Antibody Screen NEG    Sample Expiration      03/06/2019,2359 Performed at [facility], [address], [place], [place] [address]   Protime-INR     Status: Abnormal   Collection Time: 03/03/19  4:11 PM  Result Value Ref Range   Prothrombin Time 19.6 (H) 11.4 - 15.2 seconds   INR 1.7 (H) 0.8 - 1.2    Comment: (NOTE) INR goal varies based on device and disease states. Performed at [facility], [address], [place], [place] [address]     Chemistries  Recent Labs  Lab 02/25/19 [phone] 02/26/19 0601 02/27/19 0316 03/03/19 1611  NA 134* 133* 133* 135  K 3.5 3.5 3.6 4.0  CL 97* 102 102 104  CO2 22 21* 22 23  GLUCOSE 112* 95 92 118*  BUN 31* 20 15 20   CREATININE 1.13 0.83 0.79 1.18  CALCIUM 9.1 7.9* 7.8* 8.3*  AST 17 13*  --  16  ALT 18 13  --  24  ALKPHOS 43 33*  --  50  BILITOT 1.0 0.8  --  0.7   ------------------------------------------------------------------------------------------------------------------  ------------------------------------------------------------------------------------------------------------------ GFR: Estimated Creatinine Clearance: 72.8 mL/min (by C-G formula based on SCr of 1.18 mg/dL). Liver Function Tests: Recent Labs  Lab 02/25/19 0920 02/26/19 0601 03/03/19 1611   AST 17 13* 16  ALT 18 13 24   ALKPHOS 43 33* 50  BILITOT 1.0 0.8 0.7  PROT 7.2 5.3* 6.1*  ALBUMIN 3.8 2.8* 3.2*   Recent Labs  Lab 02/25/19 0920  LIPASE 53*   No results for input(s): AMMONIA in the last 168 hours. Coagulation Profile: Recent Labs  Lab 02/25/19 0920 03/03/19 1611  INR 1.2 1.7*   Cardiac Enzymes: No results for input(s): CKTOTAL, CKMB, CKMBINDEX, TROPONINI in the last 168 hours. BNP (last 3 results) No results for input(s): PROBNP in the last 8760 hours. HbA1C: No results for input(s): HGBA1C in the last 72 hours. CBG: No results for input(s): GLUCAP in the last 168 hours. Lipid Profile: No results for input(s): CHOL, HDL, LDLCALC, TRIG, CHOLHDL, LDLDIRECT in the last 72 hours. Thyroid Function Tests: No results for input(s): TSH, T4TOTAL, FREET4, T3FREE, THYROIDAB in the last 72 hours. Anemia Panel: No results for input(s): VITAMINB12, FOLATE, FERRITIN, TIBC, IRON, RETICCTPCT in the last 72 hours.  --------------------------------------------------------------------------------------------------------------- Urine analysis:    Component Value Date/Time   COLORURINE YELLOW 02/20/2013 2236   APPEARANCEUR CLEAR 02/20/2013 2236   LABSPEC 1.025 02/20/2013 2236   PHURINE 6.0 02/20/2013 2236   GLUCOSEU NEGATIVE 02/20/2013 2236   HGBUR MODERATE (A) 02/20/2013 2236   BILIRUBINUR NEGATIVE 02/20/2013 2236   KETONESUR TRACE (A) 02/20/2013 2236   PROTEINUR NEGATIVE 02/20/2013 2236   UROBILINOGEN 0.2 02/20/2013 2236   NITRITE NEGATIVE 02/20/2013 2236   LEUKOCYTESUR NEGATIVE 02/20/2013 2236      Imaging Results:    No results found.     Assessment & Plan:    Active Problems:   Hypertension   Gout   Anemia   GI bleeding   AF (paroxysmal atrial fibrillation) (HCC)  Rectal bleeding, w h/o PUD, recent indomethacin use STOP Indomethacin STOP Eliquis NPO Type and screen Cbc at 2330 Check cbc in am Start protonix 80mg  iv x1 and 8mg / hr  Diarrhea  ? GI pathogen panel C. Diff Monitor  Pafib Tele Trop I q6h x3 Continue Toprol XL 100mg  po qday Cont Cardizem CD 180mg  po qday Pt states took both today   Nausea Compazine 10mg  iv  q6h prn  Anemia Check cbc as above  Gout STOP Indomethacin Colchicine 0.6mg  po qday x 2doses (if diarrhea an issue might need prednisone)  Anxiety Ativan 1.5mg  po qhs   DVT Prophylaxis-   SCDs  AM Labs Ordered, also please review Full Orders  Family Communication: Admission, patients condition and plan of care including tests being ordered have been discussed with the patient  who indicate understanding and agree with the plan and Code Status.  Code Status:   FULL CODE  Admission status: Observation: Based on patients clinical presentation and evaluation of above clinical data, I have made determination that patient meets observation criteria at this time. Pt will require GI evaluation for rectal bleeding,  Pt requires admission along with protonix 80mg  iv x1 and continuous drip.  Pt will be admitted as observation but depending upon GI recommendations and how his Hgb does, he might require inpatient status  Time spent in minutes : 70   Jani Gravel M.D on 03/03/2019 at 6:09 PM

## 2019-03-03 NOTE — ED Notes (Signed)
Patient has cursed at me several times. Once when I put in an IV and again when I swabbed for COVID. Patient repetitively yells GD when he gets angry. Patient will apologize after he has an episode but then will curse again. Pt states that he is frustrated that he is in the hospital.

## 2019-03-03 NOTE — Telephone Encounter (Signed)
LMOM to call.

## 2019-03-03 NOTE — Telephone Encounter (Signed)
Pt returned call. Pt said he thought that DS was calling to have him go to the ED. I also told pt he is suppose to go to the ED. Pt wants DS to call him because he wants to make sure there is nothing else he needs to do. Please call pt back.

## 2019-03-03 NOTE — Care Management Obs Status (Signed)
[facility] NOTIFICATION   Patient Details  Name: Miguel Spencer MRN: [id number] Date of Birth: 04/26/1951   Medicare Observation Status Notification Given:  Yes    [place], LCSW 03/03/2019, 9:12 PM

## 2019-03-04 ENCOUNTER — Encounter (HOSPITAL_COMMUNITY): Payer: Self-pay | Admitting: Gastroenterology

## 2019-03-04 ENCOUNTER — Observation Stay (HOSPITAL_COMMUNITY): Payer: Medicare HMO

## 2019-03-04 DIAGNOSIS — Z8711 Personal history of peptic ulcer disease: Secondary | ICD-10-CM | POA: Diagnosis not present

## 2019-03-04 DIAGNOSIS — K635 Polyp of colon: Secondary | ICD-10-CM | POA: Diagnosis present

## 2019-03-04 DIAGNOSIS — M1990 Unspecified osteoarthritis, unspecified site: Secondary | ICD-10-CM | POA: Diagnosis present

## 2019-03-04 DIAGNOSIS — Z20828 Contact with and (suspected) exposure to other viral communicable diseases: Secondary | ICD-10-CM | POA: Diagnosis present

## 2019-03-04 DIAGNOSIS — I480 Paroxysmal atrial fibrillation: Secondary | ICD-10-CM | POA: Diagnosis present

## 2019-03-04 DIAGNOSIS — K219 Gastro-esophageal reflux disease without esophagitis: Secondary | ICD-10-CM | POA: Diagnosis present

## 2019-03-04 DIAGNOSIS — M609 Myositis, unspecified: Secondary | ICD-10-CM | POA: Diagnosis present

## 2019-03-04 DIAGNOSIS — D62 Acute posthemorrhagic anemia: Secondary | ICD-10-CM

## 2019-03-04 DIAGNOSIS — K922 Gastrointestinal hemorrhage, unspecified: Secondary | ICD-10-CM | POA: Diagnosis not present

## 2019-03-04 DIAGNOSIS — D649 Anemia, unspecified: Secondary | ICD-10-CM | POA: Diagnosis not present

## 2019-03-04 DIAGNOSIS — K429 Umbilical hernia without obstruction or gangrene: Secondary | ICD-10-CM | POA: Diagnosis present

## 2019-03-04 DIAGNOSIS — K5730 Diverticulosis of large intestine without perforation or abscess without bleeding: Secondary | ICD-10-CM | POA: Diagnosis present

## 2019-03-04 DIAGNOSIS — F411 Generalized anxiety disorder: Secondary | ICD-10-CM | POA: Diagnosis present

## 2019-03-04 DIAGNOSIS — Z7901 Long term (current) use of anticoagulants: Secondary | ICD-10-CM | POA: Diagnosis not present

## 2019-03-04 DIAGNOSIS — I10 Essential (primary) hypertension: Secondary | ICD-10-CM | POA: Diagnosis present

## 2019-03-04 DIAGNOSIS — K633 Ulcer of intestine: Secondary | ICD-10-CM | POA: Diagnosis present

## 2019-03-04 DIAGNOSIS — Z8249 Family history of ischemic heart disease and other diseases of the circulatory system: Secondary | ICD-10-CM | POA: Diagnosis not present

## 2019-03-04 DIAGNOSIS — Z79899 Other long term (current) drug therapy: Secondary | ICD-10-CM | POA: Diagnosis not present

## 2019-03-04 DIAGNOSIS — Z86010 Personal history of colonic polyps: Secondary | ICD-10-CM | POA: Diagnosis not present

## 2019-03-04 DIAGNOSIS — M109 Gout, unspecified: Secondary | ICD-10-CM | POA: Diagnosis present

## 2019-03-04 DIAGNOSIS — K921 Melena: Secondary | ICD-10-CM | POA: Diagnosis not present

## 2019-03-04 DIAGNOSIS — K559 Vascular disorder of intestine, unspecified: Secondary | ICD-10-CM | POA: Diagnosis present

## 2019-03-04 DIAGNOSIS — R9431 Abnormal electrocardiogram [ECG] [EKG]: Secondary | ICD-10-CM | POA: Diagnosis present

## 2019-03-04 DIAGNOSIS — K625 Hemorrhage of anus and rectum: Secondary | ICD-10-CM | POA: Diagnosis present

## 2019-03-04 DIAGNOSIS — K529 Noninfective gastroenteritis and colitis, unspecified: Secondary | ICD-10-CM | POA: Diagnosis present

## 2019-03-04 LAB — CBC
HCT: 21.8 % — ABNORMAL LOW (ref 39.0–52.0)
HCT: 23.4 % — ABNORMAL LOW (ref 39.0–52.0)
Hemoglobin: 7.1 g/dL — ABNORMAL LOW (ref 13.0–17.0)
Hemoglobin: 7.7 g/dL — ABNORMAL LOW (ref 13.0–17.0)
MCH: 31.8 pg (ref 26.0–34.0)
MCH: 32.4 pg (ref 26.0–34.0)
MCHC: 32.6 g/dL (ref 30.0–36.0)
MCHC: 32.9 g/dL (ref 30.0–36.0)
MCV: 97.8 fL (ref 80.0–100.0)
MCV: 98.3 fL (ref 80.0–100.0)
Platelets: 224 10*3/uL (ref 150–400)
Platelets: 249 10*3/uL (ref 150–400)
RBC: 2.23 MIL/uL — ABNORMAL LOW (ref 4.22–5.81)
RBC: 2.38 MIL/uL — ABNORMAL LOW (ref 4.22–5.81)
RDW: 14.1 % (ref 11.5–15.5)
RDW: 14 % (ref 11.5–15.5)
WBC: 5.8 10*3/uL (ref 4.0–10.5)
WBC: 8.1 10*3/uL (ref 4.0–10.5)
nRBC: 0 % (ref 0.0–0.2)
nRBC: 0 % (ref 0.0–0.2)

## 2019-03-04 LAB — COMPREHENSIVE METABOLIC PANEL
ALT: 18 U/L (ref 0–44)
AST: 12 U/L — ABNORMAL LOW (ref 15–41)
Albumin: 2.6 g/dL — ABNORMAL LOW (ref 3.5–5.0)
Alkaline Phosphatase: 39 U/L (ref 38–126)
Anion gap: 7 (ref 5–15)
BUN: 11 mg/dL (ref 8–23)
CO2: 22 mmol/L (ref 22–32)
Calcium: 7.9 mg/dL — ABNORMAL LOW (ref 8.9–10.3)
Chloride: 109 mmol/L (ref 98–111)
Creatinine, Ser: 0.95 mg/dL (ref 0.61–1.24)
GFR calc Af Amer: >60 mL/min (ref >60)
GFR calc non Af Amer: >60 mL/min (ref >60)
Glucose, Bld: 94 mg/dL (ref 70–99)
Potassium: 3.7 mmol/L (ref 3.5–5.1)
Sodium: 138 mmol/L (ref 135–145)
Total Bilirubin: 0.5 mg/dL (ref 0.3–1.2)
Total Protein: 5.1 g/dL — ABNORMAL LOW (ref 6.5–8.1)

## 2019-03-04 LAB — C DIFFICILE QUICK SCREEN W PCR REFLEX
C Diff antigen: NEGATIVE
C Diff interpretation: NOT DETECTED
C Diff toxin: NEGATIVE

## 2019-03-04 MED ORDER — IOHEXOL 300 MG/ML  SOLN
30.0000 mL | Freq: Once | INTRAMUSCULAR | Status: AC | PRN
  Administered 2019-03-04: 30 mL via ORAL

## 2019-03-04 MED ORDER — SODIUM CHLORIDE 0.9 % IV SOLN: INTRAVENOUS | Status: AC

## 2019-03-04 MED ORDER — CHLORHEXIDINE GLUCONATE CLOTH 2 % EX PADS
6.0000 | MEDICATED_PAD | Freq: Every day | CUTANEOUS | Status: DC
  Administered 2019-03-04 – 2019-03-08 (×5): 6 via TOPICAL

## 2019-03-04 MED ORDER — PANTOPRAZOLE SODIUM 40 MG IV SOLR
INTRAVENOUS | Status: AC
  Filled 2019-03-04: qty 80

## 2019-03-04 MED ORDER — BISACODYL 5 MG PO TBEC
10.0000 mg | DELAYED_RELEASE_TABLET | Freq: Once | ORAL | Status: AC
  Administered 2019-03-04: 10 mg via ORAL
  Filled 2019-03-04: qty 2

## 2019-03-04 MED ORDER — HYDROCODONE-ACETAMINOPHEN 5-325 MG PO TABS
1.0000 | ORAL_TABLET | Freq: Four times a day (QID) | ORAL | Status: DC | PRN
  Administered 2019-03-04 – 2019-03-08 (×8): 2 via ORAL
  Filled 2019-03-04 (×8): qty 2

## 2019-03-04 MED ORDER — IOHEXOL 300 MG/ML  SOLN
100.0000 mL | Freq: Once | INTRAMUSCULAR | Status: AC | PRN
  Administered 2019-03-04: 100 mL via INTRAVENOUS

## 2019-03-04 MED ORDER — PIPERACILLIN-TAZOBACTAM 3.375 G IVPB
3.3750 g | Freq: Three times a day (TID) | INTRAVENOUS | Status: AC
  Administered 2019-03-04 – 2019-03-07 (×9): 3.375 g via INTRAVENOUS
  Filled 2019-03-04 (×10): qty 50

## 2019-03-04 MED ORDER — PEG 3350-KCL-NA BICARB-NACL 420 G PO SOLR
4000.0000 mL | Freq: Once | ORAL | Status: AC
  Administered 2019-03-04: 4000 mL via ORAL

## 2019-03-04 MED ORDER — PREDNISONE 1 MG PO TABS
4.0000 mg | ORAL_TABLET | Freq: Every day | ORAL | Status: DC
  Administered 2019-03-04 – 2019-03-10 (×7): 4 mg via ORAL
  Filled 2019-03-04 (×9): qty 4

## 2019-03-04 MED ORDER — SODIUM CHLORIDE 0.9 % IV SOLN: INTRAVENOUS | Status: DC

## 2019-03-04 MED ORDER — LORAZEPAM 2 MG/ML IJ SOLN
0.5000 mg | Freq: Once | INTRAMUSCULAR | Status: AC
  Administered 2019-03-04: 0.5 mg via INTRAVENOUS
  Filled 2019-03-04: qty 1

## 2019-03-04 MED ORDER — BISACODYL 5 MG PO TBEC
10.0000 mg | DELAYED_RELEASE_TABLET | Freq: Once | ORAL | Status: AC
  Administered 2019-03-04: 17:00:00 10 mg via ORAL
  Filled 2019-03-04: qty 2

## 2019-03-04 NOTE — Progress Notes (Signed)
Pharmacy Antibiotic Note  Miguel Spencer is a 68 y.o. male admitted on 03/03/2019 with intra abdominal infection.  Pharmacy has been consulted for zosyn dosing.  Plan: Zosyn 3.375g IV q8h (4 hour infusion).  Height: 6' (182.9 cm) Weight: 203 lb 7.8 oz (92.3 kg) IBW/kg (Calculated) : 77.6  Temp (24hrs), Avg:98.4 F (36.9 C), Min:98.1 F (36.7 C), Max:98.9 F (37.2 C)  Recent Labs  Lab 02/26/19 0601 02/27/19 0316 02/28/19 0558 03/03/19 1611 03/03/19 2329 03/04/19 0449  WBC 8.8 8.3 8.6 8.7 5.8 8.1  CREATININE 0.83 0.79  --  1.18  --  0.95    Estimated Creatinine Clearance: 82.8 mL/min (by C-G formula based on SCr of 0.95 mg/dL).    No Known Allergies  Antimicrobials this admission: 5/28 zosyn >>  Microbiology results: 5/27 c diff: negative 5/27 gastric pcr: pending  5/27 covid 19 negative   Thank you for allowing pharmacy to be a part of this patient's care.  Donna Christen Kenlyn Lose 03/04/2019 6:39 PM

## 2019-03-04 NOTE — Progress Notes (Signed)
03/04/2019 5:42 PM   Update:  CT abdomen: Pt still has colitis which is consistent with his abdominal exam.  Have to avoid cipro due to prolonged QT.  Will treat with Zosyn.    Murvin Natal MD

## 2019-03-04 NOTE — Consult Note (Addendum)
Referring Provider: Murlean Iba, MD Primary Care Physician:  [name], MD Primary Gastroenterologist:  Barney Drain, MD  Reason for Consultation:  GI bleeding  HPI: Miguel Spencer is a 68 y.o. male with history of hypertension, A. fib on Eliquis, gout on intermittent Indocin, previous alcohol abuse, recent admission for gi bleeding/coffee-ground emesis.  EGD on 02/26/2019 showed LA grade D esophagitis, gastritis, 5 nonbleeding superficial duodenal ulcers with no stigmata of bleeding.  Gastric biopsy showed chronic inactive gastritis and no H. Pylori.  At time of discharge on May 24 his hemoglobin was 12.2.  Colonoscopy while in [place] dated October 2019, 20 x 8 mm size ascending colon polyp removed piecemeal fashion, 30 x 18 mm size sigmoid colon polyp removed with snare both sites status post tattooing.  Pathology from ascending colon showed tubular adenoma.  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  Recommend repeat colonoscopy in 3 months.  CT abdomen pelvis with contrast Feb 25, 2019 without overt cirrhosis, moderate circumferential wall thickening of the cecum and ascending colon with mild surrounding inflammatory stranding.  Previous ultrasound with elastography with fibrosis, F3 plus some F4 scores.  Patient called yesterday with complaints of multiple maroon-colored stool.  He had resumed Eliquis on May 24 and took few dosages of Indocin for his gout flare. Last dose of Eliquis afternoon on 03/03/19.  When he presented to the ED his hemoglobin was 8.8 yesterday.  INR 1.7.  Hemoglobin down to 7.7 today.  He reports a total of 8 maroon-colored stools yesterday.  On digital rectal exam he had maroon-colored stool in ED.  He has had bleeding for about 2 to 3 days however.  Progressively getting weaker.  Really denies melena.  He has had some vague burning in the lower abdomen.  No vomiting.  Denies any other NSAID or aspirin use.  He has been taking his  Protonix as prescribed.  He had a negative C. difficile antigen/toxin quick scan this admission.  GI pathogen panel pending.   Prior to Admission medications   Medication Sig Start Date End Date Taking? Authorizing Provider  apixaban (ELIQUIS) 5 MG TABS tablet Take 1 tablet (5 mg total) by mouth 2 (two) times daily. 01/04/19  Yes BranchAlphonse Guild, MD  B Complex-Biotin-FA (B-COMPLEX PO) Take 1 tablet by mouth daily.   Yes [provider]  cyclobenzaprine (FLEXERIL) 10 MG tablet Take 10 mg by mouth at bedtime as needed for muscle spasms (for sleep).  01/16/19  Yes [provider]  diltiazem (CARDIZEM CD) 180 MG 24 hr capsule Take 1 capsule (180 mg total) by mouth daily. 08/15/18  Yes Tat, Shanon Brow, MD  famotidine (PEPCID) 40 MG tablet Take 1 tablet (40 mg total) by mouth daily as needed for up to 30 days for heartburn or indigestion. Patient taking differently: Take 40 mg by mouth every morning.  02/28/19 03/30/19 Yes Shah, Pratik D, DO  indomethacin (INDOCIN) 50 MG capsule Take 100 mg by mouth daily as needed (gout).    Yes [provider]  LORazepam (ATIVAN) 0.5 MG tablet Take 3 tablets (1.5 mg total) by mouth at bedtime as needed for anxiety. 02/28/19  Yes Shah, Pratik D, DO  LYSINE PO Take 1 capsule by mouth daily.   Yes [provider]  metoprolol succinate (TOPROL-XL) 100 MG 24 hr tablet Take 1 tablet (100 mg total) by mouth daily. Take with or immediately following a meal. 08/15/18  Yes Tat, Shanon Brow, MD  Omega-3 Fatty Acids ([place]  OIL PO) Take 1 capsule by mouth daily.   Yes [provider]  pantoprazole (PROTONIX) 40 MG tablet Twice daily for 30 days, then daily thereafter. Patient taking differently: Take 40 mg by mouth 2 (two) times daily.  02/28/19  Yes Shah, Pratik D, DO  predniSONE (DELTASONE) 1 MG tablet Take 6 mg by mouth daily.  11/26/18  Yes [provider]  sildenafil (VIAGRA) 100 MG tablet Take 100 mg by mouth daily as needed for erectile  dysfunction.   Yes [provider]  valACYclovir (VALTREX) 1000 MG tablet Take 1,000 mg by mouth daily as needed (cold sores).    Yes [provider]    Current Facility-Administered Medications  Medication Dose Route Frequency Provider Last Rate Last Dose  . 0.9 %  sodium chloride infusion  250 mL Intravenous PRN Jani Gravel, MD      . 0.9 %  sodium chloride infusion   Intravenous Continuous Oswald Hillock, MD 75 mL/hr at 03/04/19 [phone]    . acetaminophen (TYLENOL) tablet 650 mg  650 mg Oral Q6H PRN Jani Gravel, MD       Or  . acetaminophen (TYLENOL) suppository 650 mg  650 mg Rectal Q6H PRN Jani Gravel, MD      . Chlorhexidine Gluconate Cloth 2 % PADS 6 each  6 each Topical Daily Jani Gravel, MD      . colchicine tablet 0.6 mg  0.6 mg Oral Daily Jani Gravel, MD      . diltiazem (CARDIZEM CD) 24 hr capsule 180 mg  180 mg Oral Daily Jani Gravel, MD      . LORazepam (ATIVAN) tablet 1.5 mg  1.5 mg Oral QHS PRN Jani Gravel, MD   1.5 mg at 03/03/19 2152  . metoprolol succinate (TOPROL-XL) 24 hr tablet 100 mg  100 mg Oral Daily Jani Gravel, MD      . pantoprazole (PROTONIX) 80 mg in sodium chloride 0.9 % 250 mL (0.32 mg/mL) infusion  8 mg/hr Intravenous Continuous Jani Gravel, MD 25 mL/hr at 03/04/19 0629 8 mg/hr at 03/04/19 0629  . prochlorperazine (COMPAZINE) injection 10 mg  10 mg Intravenous Q6H PRN Jani Gravel, MD      . sodium chloride flush (NS) 0.9 % injection 3 mL  3 mL Intravenous Q12H Jani Gravel, MD      . sodium chloride flush (NS) 0.9 % injection 3 mL  3 mL Intravenous PRN Jani Gravel, MD        Allergies as of 03/03/2019  . (No Known Allergies)    Past Medical History:  Diagnosis Date  . A-fib ([place])   . A-fib ([place])   . Alcohol abuse   . Alcoholic ([place])   . Anxiety   . Colon polyp   . Depression   . GI bleed   . Gout   . Hypertension   . Vertigo     Past Surgical History:  Procedure Laterality Date  . BIOPSY  02/26/2019   Procedure: BIOPSY;  Surgeon: [name], MD;  Location: [facility];  Service: Endoscopy;;  Gastric   . COLONOSCOPY  07/2018   [place] piecemeal fashion status post tattooing, 30 x 18 mm sized sigmoid colon polyp removed via snare status post tattooing.  Recommended repeat colonoscopy in 3 months.  . ESOPHAGOGASTRODUODENOSCOPY (EGD) WITH PROPOFOL N/A 02/26/2019   Dr. Oneida Alar: LA grade D esophagitis, gastritis with benign biopsies, no H. pylori, 5 nonbleeding superficial duodenal ulcers.  Felt to be NSAID related.  Marland Kitchen  HERNIA REPAIR      Family History  Problem Relation Age of Onset  . Cancer Mother   . Heart disease Father   . Heart disease Brother   . Heart disease Other   . Colon cancer Neg Hx   . Gastric cancer Neg Hx   . Esophageal cancer Neg Hx     Social History   Socioeconomic History  . Marital status: Single    Spouse name: Not on file  . Number of children: Not on file  . Years of education: Not on file  . Highest education level: Not on file  Occupational History  . Not on file  Social Needs  . Financial resource strain: Not on file  . Food insecurity:    Worry: Not on file    Inability: Not on file  . Transportation needs:    Medical: Not on file    Non-medical: Not on file  Tobacco Use  . Smoking status: Never Smoker  . Smokeless tobacco: Never Used  Substance and Sexual Activity  . Alcohol use: Not Currently    Comment: No ETOH 6-8 weeks (as of 02/15/19). "alcoholic all my life"; Currently drinks all day for 7 days straight 1-2 times (weeks) a month  . Drug use: No  . Sexual activity: Not on file  Lifestyle  . Physical activity:    Days per week: Not on file    Minutes per session: Not on file  . Stress: Not on file  Relationships  . Social connections:    Talks on phone: Not on file    Gets together: Not on file    Attends religious service: Not on file    Active member of club or organization: Not on file    Attends meetings of clubs or organizations: Not on file     Relationship status: Not on file  . Intimate partner violence:    Fear of current or ex partner: Not on file    Emotionally abused: Not on file    Physically abused: Not on file    Forced sexual activity: Not on file  Other Topics Concern  . Not on file  Social History Narrative  . Not on file     ROS:  General: Negative for anorexia, weight loss, fever, chills, positive fatigue, positive weakness. Eyes: Negative for vision changes.  ENT: Negative for hoarseness, difficulty swallowing , nasal congestion. CV: Negative for chest pain, angina, palpitations, dyspnea on exertion, peripheral edema.  Respiratory: Negative for dyspnea at rest, dyspnea on exertion, cough, sputum, wheezing.  GI: See history of present illness. GU:  Negative for dysuria, hematuria, urinary incontinence, urinary frequency, nocturnal urination.  MS: Positive left elbow, left knee.  No low back pain.  Derm: Negative for rash or itching.  Neuro: Negative for weakness, abnormal sensation, seizure, frequent headaches, memory loss, confusion.  Psych: Negative for anxiety, depression, suicidal ideation, hallucinations.  Endo: Negative for unusual weight change.  Heme: Negative for bruising or bleeding. Allergy: Negative for rash or hives.       Physical Examination: Vital signs in last 24 hours: Temp:  [98.9 F (37.2 C)] 98.9 F (37.2 C) (05/27 2200) Pulse Rate:  [65-111] 75 (05/28 0600) Resp:  [13-24] 13 (05/28 0600) BP: (96-112)/(67-88) 103/76 (05/28 0600) SpO2:  [95 %-100 %] 97 % (05/28 0600) Weight:  [92.3 kg-95.3 kg] 92.3 kg (05/28 0500) Last BM Date: 03/03/19  General: Well-nourished, well-developed in no acute distress.  Head: Normocephalic, atraumatic.   Eyes:  Conjunctiva pink, no icterus. Mouth: Oropharyngeal mucosa moist and pink , no lesions erythema or exudate. Neck: Supple without thyromegaly, masses, or lymphadenopathy.  Lungs: Clear to auscultation bilaterally.  Heart: Regular rate and  rhythm, no murmurs rubs or gallops.  Abdomen: Bowel sounds are normal, nondistended, no hepatosplenomegaly or masses, no abdominal bruits, no rebound or guarding.  Umbilical hernia easily reducible slightly tender Rectal: Done in the ED Extremities: No lower extremity edema, clubbing, deformity.  Neuro: Alert and oriented x 4 , grossly normal neurologically.  Skin: Warm and dry, no rash or jaundice.   Psych: Alert and cooperative, normal mood and affect.        Intake/Output from previous day: 05/27 0701 - 05/28 0700 In: 2062.2 [P.O.:120; I.V.:843.2; IV Piggyback:1099] Out: 1850 [Urine:1850] Intake/Output this shift: No intake/output data recorded.  Lab Results: CBC Recent Labs    03/03/19 1611 03/03/19 2329 03/04/19 0449  WBC 8.7 5.8 8.1  HGB 8.8* 7.1* 7.7*  HCT 26.5* 21.8* 23.4*  MCV 96.4 97.8 98.3  PLT 295 224 249   BMET Recent Labs    03/03/19 1611 03/04/19 0449  NA 135 138  K 4.0 3.7  CL 104 109  CO2 23 22  GLUCOSE 118* 94  BUN 20 11  CREATININE 1.18 0.95  CALCIUM 8.3* 7.9*   LFT Recent Labs    03/03/19 1611 03/04/19 0449  BILITOT 0.7 0.5  ALKPHOS 50 39  AST 16 12*  ALT 24 18  PROT 6.1* 5.1*  ALBUMIN 3.2* 2.6*    Lipase No results for input(s): LIPASE in the last 72 hours.  PT/INR Recent Labs    03/03/19 1611  LABPROT 19.6*  INR 1.7*      Imaging Studies: Ct Abdomen Pelvis W Contrast  Result Date: 02/25/2019 CLINICAL DATA:  Rectal bleeding with nausea and vomiting for the past 4 days. History of gastric ulcer and alcohol abuse. EXAM: CT ABDOMEN AND PELVIS WITH CONTRAST TECHNIQUE: Multidetector CT imaging of the abdomen and pelvis was performed using the standard protocol following bolus administration of intravenous contrast. CONTRAST:  153mL OMNIPAQUE IOHEXOL 300 MG/ML  SOLN COMPARISON:  Abdominal ultrasound dated December 23, 2018. CT abdomen pelvis dated January 20, 2013. FINDINGS: Lower chest: No acute abnormality. Hepatobiliary: No focal  liver abnormality is seen. No gallstones, gallbladder wall thickening, or biliary dilatation. Pancreas: Unremarkable. No pancreatic ductal dilatation or surrounding inflammatory changes. Spleen: Normal in size without focal abnormality. Adrenals/Urinary Tract: Adrenal glands are unremarkable. Kidneys are normal, without renal calculi, focal lesion, or hydronephrosis. Bladder is unremarkable for the degree of distention. Stomach/Bowel: Moderate circumferential wall thickening of the cecum and ascending colon with mild surrounding inflammatory stranding. The remaining colon is unremarkable. The stomach and small bowel are within normal limits. No obstruction. Normal appendix. Vascular/Lymphatic: Aortic atherosclerosis. No enlarged abdominal or pelvic lymph nodes. Reproductive: Prostate is unremarkable. Other: Unchanged small fat containing umbilical hernia. No free fluid or pneumoperitoneum. Musculoskeletal: No acute or significant osseous findings. Prominent Schmorl's nodes involving the T10 and T11 superior endplates. IMPRESSION: 1. Acute colitis involving the cecum and ascending colon. 2.  Aortic atherosclerosis (ICD10-I70.0). Electronically Signed   By: Titus Dubin M.D.   On: 02/25/2019 11:52  [4 week]   Impression: Pleasant 68 year old gentleman presenting with several episodes of GI bleeding, maroon color stool in the setting of Eliquis.  Recent admission with coffee-ground emesis, suspected colitis on CT possibly foodborne.  EGD 5/22 showed LA grade D esophagitis, gastritis with benign biopsies (no H. pylori), 5 nonbleeding  superficial duodenal ulcers.  He resumed Eliquis on 02/28/2019, admits to limited Indocin use since discharge.  He has a history of large ascending colon and sigmoid colon polyps removed in October 2019, sigmoid polyp was a tubulovillous adenoma with high-grade dysplasia and he is overdue for surveillance colonoscopy.  I would be less suspicious for bleeding from superficial  duodenal ulcers seen next days ago on EGD.  Wonder if he is having bleeding from colonic source at this point.  Last dose of Eliquis yesterday afternoon.  Plan: 1. To discuss with Dr.Rourk. Consider colonoscopy this admission once off Eliquis 24-48 hours.  2. Continue PPI drip for now. 3. Transfuse as needed.  We would like to thank you for the opportunity to participate in the care of Miguel Spencer.  Laureen Ochs. Bernarda Caffey [facility] [phone] 5/28/202010:06 AM     LOS: 0 days   Addendum: discussed with Dr. Gala Romney.  Proceed for colonoscopy tomorrow.  If colonoscopy is unremarkable we will plan for another upper endoscopy to follow.  Begin bowel prep today.  Laureen Ochs. Bernarda Caffey [facility] [phone] 5/28/20201:06 PM

## 2019-03-04 NOTE — Progress Notes (Signed)
PROGRESS NOTE    GILDARDO TICKNER  ZOX:[id number]  DOB: 05-Apr-1951  DOA: 03/03/2019 PCP: [name], MD   Brief Admission Hx: 68 y/o male on apixaban for PAF, HTN, presents with recurrent GI bleeding and abdominal pain with malaise.   MDM/Assessment & Plan:   1. Rectal bleeding - Pt reports significant amount of rectal bleeding since restarting apixaban and taking indomethacin for gout.  He has been seen by GI and will have further work up.  Monitor in SDU for hemodynamic instability.  Continue IV protonix.   2. Acute on chronic blood loss anemia - transfuse as needed. Follow CBC.  3. Abdominal pain - LLQ abdominal pain, with recent bout of colitis will obtain another CT abdomen.  4. PAF - He will be maintained on home toprol XL and cardizem CD.  Holding apixaban.  5. Gout - Colchicine ordered.  DC indomethacin.  6. GAD - alprazolam ordered. 7. Myositis / Arthritis - He has been on daily prednisone prescribed by his rheumatologist for last 3 months, it is being weaned down, now down to 6 mg daily.  Don't want to abruptly stop this, will resume at lower dose to continue weaning.   DVT prophylaxis: SCDs Code Status: Full  Family Communication:  Disposition Plan: stepdown ICU   Consultants:  GI  Procedures:    Antimicrobials:     Subjective: Pt says he does not feel well, he is weak and his abdomen hurts, he continues to have bloody stools. He is nauseated.   Objective: Vitals:   03/04/19 0600 03/04/19 0700 03/04/19 0800 03/04/19 0820  BP: 103/76 90/74 93/80    Pulse: 75 93 87   Resp: 13 15 14    Temp:    98.1 F (36.7 C)  TempSrc:    Oral  SpO2: 97% 97% 99%   Weight:      Height:        Intake/Output Summary (Last 24 hours) at 03/04/2019 1117 Last data filed at 03/04/2019 8119 Gross per 24 hour  Intake 2062.17 ml  Output 1850 ml  Net 212.17 ml   Filed Weights   03/03/19 1545 03/03/19 2200 03/04/19 0500  Weight: 95.3 kg 93.5 kg 92.3 kg     REVIEW OF  SYSTEMS  As per history otherwise all reviewed and reported negative  Exam:  General exam: awake, alert, NAD. Cooperative. Talkative.  Respiratory system: Clear. No increased work of breathing. Cardiovascular system: S1 & S2 heard. No JVD, murmurs, gallops, clicks or pedal edema. Gastrointestinal system: Abdomen is mildly distended diffusely tender but more tender LLQ with guarding. Normal bowel sounds heard. Central nervous system: Alert and oriented. No focal neurological deficits. Extremities: no CCE.  Data Reviewed: Basic Metabolic Panel: Recent Labs  Lab 02/26/19 0601 02/27/19 0316 03/03/19 1611 03/04/19 0449  NA 133* 133* 135 138  K 3.5 3.6 4.0 3.7  CL 102 102 104 109  CO2 21* 22 23 22   GLUCOSE 95 92 118* 94  BUN 20 15 20 11   CREATININE 0.83 0.79 1.18 0.95  CALCIUM 7.9* 7.8* 8.3* 7.9*   Liver Function Tests: Recent Labs  Lab 02/26/19 0601 03/03/19 1611 03/04/19 0449  AST 13* 16 12*  ALT 13 24 18   ALKPHOS 33* 50 39  BILITOT 0.8 0.7 0.5  PROT 5.3* 6.1* 5.1*  ALBUMIN 2.8* 3.2* 2.6*   No results for input(s): LIPASE, AMYLASE in the last 168 hours. No results for input(s): AMMONIA in the last 168 hours. CBC: Recent Labs  Lab 02/27/19  7510 02/28/19 0558 03/03/19 1611 03/03/19 2329 03/04/19 0449  WBC 8.3 8.6 8.7 5.8 8.1  NEUTROABS  --   --  5.8  --   --   HGB 11.2* 12.2* 8.8* 7.1* 7.7*  HCT 32.6* 37.7* 26.5* 21.8* 23.4*  MCV 96.2 99.2 96.4 97.8 98.3  PLT 180 219 295 224 249   Cardiac Enzymes: No results for input(s): CKTOTAL, CKMB, CKMBINDEX, TROPONINI in the last 168 hours. CBG (last 3)  No results for input(s): GLUCAP in the last 72 hours. Recent Results (from the past 240 hour(s))  SARS Coronavirus 2 (CEPHEID - Performed in [place] hospital lab), Hosp Order     Status: None   Collection Time: 02/25/19 12:15 PM  Result Value Ref Range Status   SARS Coronavirus 2 NEGATIVE NEGATIVE Final    Comment: (NOTE) If result is NEGATIVE SARS-CoV-2  target nucleic acids are NOT DETECTED. The SARS-CoV-2 RNA is generally detectable in upper and lower  respiratory specimens during the acute phase of infection. The lowest  concentration of SARS-CoV-2 viral copies this assay can detect is 250  copies / mL. A negative result does not preclude SARS-CoV-2 infection  and should not be used as the sole basis for treatment or other  patient management decisions.  A negative result may occur with  improper specimen collection / handling, submission of specimen other  than nasopharyngeal swab, presence of viral mutation(s) within the  areas targeted by this assay, and inadequate number of viral copies  (<250 copies / mL). A negative result must be combined with clinical  observations, patient history, and epidemiological information. If result is POSITIVE SARS-CoV-2 target nucleic acids are DETECTED. The SARS-CoV-2 RNA is generally detectable in upper and lower  respiratory specimens dur ing the acute phase of infection.  Positive  results are indicative of active infection with SARS-CoV-2.  Clinical  correlation with patient history and other diagnostic information is  necessary to determine patient infection status.  Positive results do  not rule out bacterial infection or co-infection with other viruses. If result is PRESUMPTIVE POSTIVE SARS-CoV-2 nucleic acids MAY BE PRESENT.   A presumptive positive result was obtained on the submitted specimen  and confirmed on repeat testing.  While 2019 novel coronavirus  (SARS-CoV-2) nucleic acids may be present in the submitted sample  additional confirmatory testing may be necessary for epidemiological  and / or clinical management purposes  to differentiate between  SARS-CoV-2 and other Sarbecovirus currently known to infect humans.  If clinically indicated additional testing with an alternate test  methodology [phone]) is advised. The SARS-CoV-2 RNA is generally  detectable in upper and lower  respiratory sp ecimens during the acute  phase of infection. The expected result is Negative. Fact Sheet for Patients:  StrictlyIdeas.no Fact Sheet for Healthcare Providers: BankingDealers.co.za This test is not yet approved or cleared by the [place] FDA and has been authorized for detection and/or diagnosis of SARS-CoV-2 by FDA under an Emergency Use Authorization (EUA).  This EUA will remain in effect (meaning this test can be used) for the duration of the COVID-19 declaration under Section 564(b)(1) of the Act, 21 U.S.C. section 360bbb-3(b)(1), unless the authorization is terminated or revoked sooner. Performed at [facility], [address] [place], [place] [address]   MRSA PCR Screening     Status: None   Collection Time: 02/25/19  3:52 PM  Result Value Ref Range Status   MRSA by PCR NEGATIVE NEGATIVE Final    Comment:  The GeneXpert MRSA Assay (FDA approved for NASAL specimens only), is one component of a comprehensive MRSA colonization surveillance program. It is not intended to diagnose MRSA infection nor to guide or monitor treatment for MRSA infections. Performed at [facility], [address], [place], [place] [address]   Gastrointestinal Panel by PCR , Stool     Status: None   Collection Time: 02/28/19  2:00 AM  Result Value Ref Range Status   Campylobacter species NOT DETECTED NOT DETECTED Final   Plesimonas shigelloides NOT DETECTED NOT DETECTED Final   Salmonella species NOT DETECTED NOT DETECTED Final   Yersinia enterocolitica NOT DETECTED NOT DETECTED Final   Vibrio species NOT DETECTED NOT DETECTED Final   Vibrio cholerae NOT DETECTED NOT DETECTED Final   Enteroaggregative E coli (EAEC) NOT DETECTED NOT DETECTED Final   Enteropathogenic E coli (EPEC) NOT DETECTED NOT DETECTED Final   Enterotoxigenic E coli (ETEC) NOT DETECTED NOT DETECTED Final   Shiga like toxin producing E coli (STEC) NOT  DETECTED NOT DETECTED Final   Shigella/Enteroinvasive E coli (EIEC) NOT DETECTED NOT DETECTED Final   Cryptosporidium NOT DETECTED NOT DETECTED Final   Cyclospora cayetanensis NOT DETECTED NOT DETECTED Final   Entamoeba histolytica NOT DETECTED NOT DETECTED Final   Giardia lamblia NOT DETECTED NOT DETECTED Final   Adenovirus F40/41 NOT DETECTED NOT DETECTED Final   Astrovirus NOT DETECTED NOT DETECTED Final   Norovirus GI/GII NOT DETECTED NOT DETECTED Final   Rotavirus A NOT DETECTED NOT DETECTED Final   Sapovirus (I, II, IV, and V) NOT DETECTED NOT DETECTED Final    Comment: Performed at [facility], [address] [place], [place] [address]  SARS Coronavirus 2 (CEPHEID - Performed in [place] hospital lab), Hosp Order     Status: None   Collection Time: 03/03/19  6:05 PM  Result Value Ref Range Status   SARS Coronavirus 2 NEGATIVE NEGATIVE Final    Comment: (NOTE) If result is NEGATIVE SARS-CoV-2 target nucleic acids are NOT DETECTED. The SARS-CoV-2 RNA is generally detectable in upper and lower  respiratory specimens during the acute phase of infection. The lowest  concentration of SARS-CoV-2 viral copies this assay can detect is 250  copies / mL. A negative result does not preclude SARS-CoV-2 infection  and should not be used as the sole basis for treatment or other  patient management decisions.  A negative result may occur with  improper specimen collection / handling, submission of specimen other  than nasopharyngeal swab, presence of viral mutation(s) within the  areas targeted by this assay, and inadequate number of viral copies  (<250 copies / mL). A negative result must be combined with clinical  observations, patient history, and epidemiological information. If result is POSITIVE SARS-CoV-2 target nucleic acids are DETECTED. The SARS-CoV-2 RNA is generally detectable in upper and lower  respiratory specimens dur ing the acute phase of infection.  Positive   results are indicative of active infection with SARS-CoV-2.  Clinical  correlation with patient history and other diagnostic information is  necessary to determine patient infection status.  Positive results do  not rule out bacterial infection or co-infection with other viruses. If result is PRESUMPTIVE POSTIVE SARS-CoV-2 nucleic acids MAY BE PRESENT.   A presumptive positive result was obtained on the submitted specimen  and confirmed on repeat testing.  While 2019 novel coronavirus  (SARS-CoV-2) nucleic acids may be present in the submitted sample  additional confirmatory testing may be necessary for epidemiological  and /  or clinical management purposes  to differentiate between  SARS-CoV-2 and other Sarbecovirus currently known to infect humans.  If clinically indicated additional testing with an alternate test  methodology [phone]) is advised. The SARS-CoV-2 RNA is generally  detectable in upper and lower respiratory sp ecimens during the acute  phase of infection. The expected result is Negative. Fact Sheet for Patients:  StrictlyIdeas.no Fact Sheet for Healthcare Providers: BankingDealers.co.za This test is not yet approved or cleared by the [place] FDA and has been authorized for detection and/or diagnosis of SARS-CoV-2 by FDA under an Emergency Use Authorization (EUA).  This EUA will remain in effect (meaning this test can be used) for the duration of the COVID-19 declaration under Section 564(b)(1) of the Act, 21 U.S.C. section 360bbb-3(b)(1), unless the authorization is terminated or revoked sooner. Performed at [facility], [address], [place], [place] [address]   C difficile quick scan w PCR reflex     Status: None   Collection Time: 03/03/19  9:39 PM  Result Value Ref Range Status   C Diff antigen NEGATIVE NEGATIVE Final   C Diff toxin NEGATIVE NEGATIVE Final   C Diff interpretation No C. difficile detected.   Final    Comment: Performed at [facility], [address], [place], [place] [address]     Studies: No results found.  Scheduled Meds: . Chlorhexidine Gluconate Cloth  6 each Topical Daily  . colchicine  0.6 mg Oral Daily  . diltiazem  180 mg Oral Daily  . metoprolol succinate  100 mg Oral Daily  . sodium chloride flush  3 mL Intravenous Q12H   Continuous Infusions: . sodium chloride    . sodium chloride 75 mL/hr at 03/04/19 0629  . pantoprozole (PROTONIX) infusion 8 mg/hr (03/04/19 4782)    Active Problems:   Hypertension   Gout   Anemia   GI bleeding   AF (paroxysmal atrial fibrillation) (HCC)   Acute blood loss anemia   Acute GI bleeding  Critical CareTime spent: 33 minutes  Irwin Brakeman, MD [facility] 03/04/2019, 11:17 AM    LOS: 0 days  How to contact the [facility] Attending or Consulting provider [place] or covering provider during after hours [place], for this patient?  1. Check the care team in [facility] and look for a) attending/consulting [facility] provider listed and b) the [facility] team listed 2. Log into [email or web address] and use [place]'s universal password to access. If you do not have the password, please contact the hospital operator. 3. Locate the [facility] provider you are looking for under [facility] and page to a number that you can be directly reached. 4. If you still have difficulty reaching the provider, please page the [facility] (Director on Call) for the Hospitalists listed on amion for assistance.

## 2019-03-05 ENCOUNTER — Encounter (HOSPITAL_COMMUNITY): Payer: Self-pay | Admitting: *Deleted

## 2019-03-05 ENCOUNTER — Encounter (HOSPITAL_COMMUNITY)
Admission: EM | Disposition: A | Discharge status: Home / Self Care | Payer: Self-pay | Source: Home / Self Care | Attending: Family Medicine

## 2019-03-05 ENCOUNTER — Inpatient Hospital Stay (HOSPITAL_COMMUNITY): Payer: Medicare HMO

## 2019-03-05 ENCOUNTER — Inpatient Hospital Stay (HOSPITAL_COMMUNITY): Payer: Medicare HMO | Admitting: Anesthesiology

## 2019-03-05 DIAGNOSIS — K5730 Diverticulosis of large intestine without perforation or abscess without bleeding: Secondary | ICD-10-CM

## 2019-03-05 DIAGNOSIS — K922 Gastrointestinal hemorrhage, unspecified: Secondary | ICD-10-CM | POA: Diagnosis not present

## 2019-03-05 DIAGNOSIS — K635 Polyp of colon: Secondary | ICD-10-CM

## 2019-03-05 DIAGNOSIS — D649 Anemia, unspecified: Secondary | ICD-10-CM | POA: Diagnosis not present

## 2019-03-05 DIAGNOSIS — K921 Melena: Secondary | ICD-10-CM

## 2019-03-05 HISTORY — PX: BIOPSY: SHX5522

## 2019-03-05 HISTORY — PX: POLYPECTOMY: SHX5525

## 2019-03-05 HISTORY — PX: COLONOSCOPY WITH PROPOFOL: SHX5780

## 2019-03-05 LAB — COMPREHENSIVE METABOLIC PANEL
ALT: 16 U/L (ref 0–44)
AST: 10 U/L — ABNORMAL LOW (ref 15–41)
Albumin: 2.7 g/dL — ABNORMAL LOW (ref 3.5–5.0)
Alkaline Phosphatase: 40 U/L (ref 38–126)
Anion gap: 7 (ref 5–15)
BUN: 9 mg/dL (ref 8–23)
CO2: 22 mmol/L (ref 22–32)
Calcium: 8 mg/dL — ABNORMAL LOW (ref 8.9–10.3)
Chloride: 107 mmol/L (ref 98–111)
Creatinine, Ser: 1.1 mg/dL (ref 0.61–1.24)
GFR calc Af Amer: >60 mL/min (ref >60)
GFR calc non Af Amer: >60 mL/min (ref >60)
Glucose, Bld: 98 mg/dL (ref 70–99)
Potassium: 3.5 mmol/L (ref 3.5–5.1)
Sodium: 136 mmol/L (ref 135–145)
Total Bilirubin: 0.6 mg/dL (ref 0.3–1.2)
Total Protein: 5.3 g/dL — ABNORMAL LOW (ref 6.5–8.1)

## 2019-03-05 LAB — CBC
HCT: 22.3 % — ABNORMAL LOW (ref 39.0–52.0)
Hemoglobin: 7.4 g/dL — ABNORMAL LOW (ref 13.0–17.0)
MCH: 32.5 pg (ref 26.0–34.0)
MCHC: 33.2 g/dL (ref 30.0–36.0)
MCV: 97.8 fL (ref 80.0–100.0)
Platelets: 262 10*3/uL (ref 150–400)
RBC: 2.28 MIL/uL — ABNORMAL LOW (ref 4.22–5.81)
RDW: 14.2 % (ref 11.5–15.5)
WBC: 6 10*3/uL (ref 4.0–10.5)
nRBC: 0 % (ref 0.0–0.2)

## 2019-03-05 LAB — GASTROINTESTINAL PANEL BY PCR, STOOL (REPLACES STOOL CULTURE)

## 2019-03-05 SURGERY — COLONOSCOPY WITH PROPOFOL
Anesthesia: General

## 2019-03-05 MED ORDER — PHENYLEPHRINE 40 MCG/ML (10ML) SYRINGE FOR IV PUSH (FOR BLOOD PRESSURE SUPPORT)
PREFILLED_SYRINGE | INTRAVENOUS | Status: AC
  Filled 2019-03-05: qty 10

## 2019-03-05 MED ORDER — IOHEXOL 350 MG/ML SOLN
100.0000 mL | Freq: Once | INTRAVENOUS | Status: AC | PRN
  Administered 2019-03-05: 14:00:00 100 mL via INTRAVENOUS
  Filled 2019-03-05: qty 100

## 2019-03-05 MED ORDER — PROPOFOL 500 MG/50ML IV EMUL
INTRAVENOUS | Status: DC | PRN
  Administered 2019-03-05: 150 ug/kg/min via INTRAVENOUS

## 2019-03-05 MED ORDER — LACTATED RINGERS IV SOLN
INTRAVENOUS | Status: DC
  Administered 2019-03-05 (×2): via INTRAVENOUS

## 2019-03-05 MED ORDER — HYDROMORPHONE HCL 1 MG/ML IJ SOLN: 0.2500 mg | INTRAMUSCULAR | Status: DC | PRN

## 2019-03-05 MED ORDER — PHENYLEPHRINE HCL (PRESSORS) 10 MG/ML IV SOLN
INTRAVENOUS | Status: DC | PRN
  Administered 2019-03-05 (×2): 80 ug via INTRAVENOUS

## 2019-03-05 MED ORDER — PROPOFOL 10 MG/ML IV BOLUS
INTRAVENOUS | Status: DC | PRN
  Administered 2019-03-05: 40 mg via INTRAVENOUS

## 2019-03-05 MED ORDER — SODIUM CHLORIDE 0.9 % IV SOLN: INTRAVENOUS | Status: DC

## 2019-03-05 MED ORDER — KETAMINE HCL 10 MG/ML IJ SOLN
INTRAMUSCULAR | Status: DC | PRN
  Administered 2019-03-05: 10 mg via INTRAVENOUS

## 2019-03-05 MED ORDER — LORAZEPAM 2 MG/ML IJ SOLN
0.5000 mg | INTRAMUSCULAR | Status: AC | PRN
  Administered 2019-03-05 – 2019-03-06 (×3): 0.5 mg via INTRAVENOUS
  Filled 2019-03-05 (×3): qty 1

## 2019-03-05 MED ORDER — KETAMINE HCL 50 MG/5ML IJ SOSY
PREFILLED_SYRINGE | INTRAMUSCULAR | Status: AC
  Filled 2019-03-05: qty 5

## 2019-03-05 MED ORDER — SODIUM CHLORIDE 0.9 % IV BOLUS
500.0000 mL | Freq: Once | INTRAVENOUS | Status: AC
  Administered 2019-03-05: 500 mL via INTRAVENOUS

## 2019-03-05 NOTE — Transfer of Care (Signed)
Immediate Anesthesia Transfer of Care Note  Patient: Miguel Spencer  Procedure(s) Performed: COLONOSCOPY WITH PROPOFOL (N/A ) BIOPSY POLYPECTOMY  Patient Location: PACU  Anesthesia Type:General  Level of Consciousness: awake  Airway & Oxygen Therapy: Patient Spontanous Breathing  Post-op Assessment: Report given to RN  Post vital signs: Reviewed  Last Vitals:  Vitals Value Taken Time  BP 100/86 03/05/2019  1:43 PM  Temp    Pulse 109 03/05/2019  1:44 PM  Resp 18 03/05/2019  1:45 PM  SpO2 100 % 03/05/2019  1:44 PM  Vitals shown include unvalidated device data.  Last Pain:  Vitals:   03/05/19 1232  TempSrc: Oral  PainSc: 9       Patients Stated Pain Goal: 0 (47/65/46 5035)  Complications: No apparent anesthesia complications

## 2019-03-05 NOTE — Anesthesia Preprocedure Evaluation (Addendum)
Anesthesia Evaluation  Patient identified by MRN, date of birth, ID band Patient awake    Reviewed: Allergy & Precautions, NPO status , Patient's Chart, lab work & pertinent test results  Airway Mallampati: II  TM Distance: >3 FB Neck ROM: Full    Dental no notable dental hx. (+) Teeth Intact   Pulmonary neg pulmonary ROS,    Pulmonary exam normal breath sounds clear to auscultation       Cardiovascular Exercise Tolerance: Good hypertension, Pt. on medications and Pt. on home beta blockers Normal cardiovascular exam+ dysrhythmias Atrial Fibrillation I Rhythm:Regular Rate:Normal  Reports Chronic Afib with previously great ET >10 miles  Presents with Afib with fast rate -reports never been cardioverted. States bleeding issues after adding indocin to Eliquis    Neuro/Psych Anxiety Depression negative neurological ROS  negative psych ROS   GI/Hepatic Neg liver ROS, GERD  Medicated and Controlled,S/p recent UGI   Endo/Other  negative endocrine ROS  Renal/GU negative Renal ROS  negative genitourinary   Musculoskeletal negative musculoskeletal ROS (+)   Abdominal   Peds negative pediatric ROS (+)  Hematology negative hematology ROS (+) anemia , Denies any transfusions this hospitalization.   Anesthesia Other Findings   Reproductive/Obstetrics negative OB ROS                             Anesthesia Physical Anesthesia Plan  ASA: IV  Anesthesia Plan: General   Post-op Pain Management:    Induction:   PONV Risk Score and Plan:   Airway Management Planned: Nasal Cannula and Simple Face Mask  Additional Equipment:   Intra-op Plan:   Post-operative Plan:   Informed Consent:   Plan Discussed with:   Anesthesia Plan Comments: (Plan full PPE use  Plan GA as tolerated-GETA as needed  BP soft and HR up after prep, given fluids now  -d/w pt if becomes unstable may abort if  necessary Last H/H 7.4/22 -states is ok receiving blood if necessary)       Anesthesia Quick Evaluation

## 2019-03-05 NOTE — Progress Notes (Signed)
CTA negative for significant vascular stenosis;  Will advance to a low residue diet.  F/U BX; reassess in am

## 2019-03-05 NOTE — Progress Notes (Signed)
03/05/2019 4:31 PM  I was able to speak with patient's uncle Mr. Micco and updated him.  His number is [phone]  C. Wynetta Emery MD

## 2019-03-05 NOTE — Op Note (Addendum)
[facility] Patient Name: Miguel Spencer Procedure Date: 03/05/2019 1:12 PM MRN: [id number] Date of Birth: 08-20-1951 Attending MD: Norvel Richards , MD CSN: [id number] Age: 68 Admit Type: Outpatient Procedure:                Ileo-colonoscopy Indications:              Hematochezia, Melena Providers:                Norvel Richards, MD, Hinton Rao, RN, Aram Candela Referring MD:             Edwinna Areola. Hall MD Medicines:                Propofol per Anesthesia Complications:            No immediate complications. Estimated Blood Loss:     Estimated blood loss was minimal. Procedure:                Pre-Anesthesia Assessment:                           - Prior to the procedure, a History and Physical                            was performed, and patient medications and                            allergies were reviewed. The patient's tolerance of                            previous anesthesia was also reviewed. The risks                            and benefits of the procedure and the sedation                            options and risks were discussed with the patient.                            All questions were answered, and informed consent                            was obtained. Prior Anticoagulants: The patient has                            taken no previous anticoagulant or antiplatelet                            agents. ASA Grade Assessment: III - A patient with                            severe systemic disease. After reviewing the risks  and benefits, the patient was deemed in                            satisfactory condition to undergo the procedure.                           After obtaining informed consent, the colonoscope                            was passed under direct vision. Throughout the                            procedure, the patient's blood pressure, pulse, and                            oxygen  saturations were monitored continuously. The                            CF-[id number] ([id number]) scope was introduced through                            the and advanced to the 5 cm into the ileum. The                            colonoscopy was performed without difficulty. The                            patient tolerated the procedure well. The quality                            of the bowel preparation was adequate. The terminal                            ileum, ileocecal valve, appendiceal orifice, and                            rectum were photographed. The entire colon was well                            visualized. Scope In: 1:14:31 PM Scope Out: 1:35:02 PM Scope Withdrawal Time: 0 hours 15 minutes 41 seconds  Total Procedure Duration: 0 hours 20 minutes 31 seconds  Findings:      The perianal and digital rectal examinations were normal.      Two pedunculated polyps were found in the sigmoid colon. The polyps were       4 to 10 mm in size. These polyps were removed with a cold snare.       Resection and retrieval were complete. Estimated blood loss was minimal.       Site of prior inking noted in the sigmoid segment. No residual polyp       identified at this location.      Scattered small-mouthed diverticula were found in the sigmoid colon.      In the ascending segment there was marked abnormality with extensive       "  carpet?like" ulceration of the mucosa and a skip pattern with overlying       clot/eschar; intervening normal-appearing mucosa extending all the way       to the ileocecal valve. Ulcers were large, being several centimeters in       dimension. Please see multiple photographs. Ileocecal valve somewhat       patulous was able to inspect the distal 5 cm. This segment appeared       normal. Biopsies taken. Impression:               - Two 4 to 10 mm polyps in the sigmoid colon,                            removed with a cold snare. Resected and retrieved.                            - Diverticulosis in the sigmoid colon.                           -Extensive ascending colon ulceration and a skipped                            pattern highly suspicious for ischemia. This may be                            a harbinger of more extensive (SMA distribution)                            bowel at risk. Less likely inflammatory bowel                            disease or NSAID effect. Moderate Sedation:      Moderate (conscious) sedation was personally administered by an       anesthesia professional. The following parameters were monitored: oxygen       saturation, heart rate, blood pressure, respiratory rate, EKG, adequacy       of pulmonary ventilation, and response to care. Recommendation:           - Return patient to ICU for ongoing care. Follow-up                            on pathology. Reviewed last nights CT with Dr.                            Nyoka Cowden. Flow was noted in the SMA on last night's                            contrast study. However, with today's findings, we                            mutually agreed to the CTA abdomen and pelvis was                            warranted. Study will be pursued. At patient's  request, I called his aunt and uncle, Miguel Spencer and                            Miguel Spencer at [phone]. I got no answer. Procedure Code(s):        --- Professional ---                           [phone], Colonoscopy, flexible; with removal of                            tumor(s), polyp(s), or other lesion(s) by snare                            technique Diagnosis Code(s):        --- Professional ---                           K63.5, Polyp of colon                           K92.1, Melena (includes Hematochezia)                           K57.30, Diverticulosis of large intestine without                            perforation or abscess without bleeding CPT copyright 2019 American Medical Association. All rights reserved. The  codes documented in this report are preliminary and upon coder review may  be revised to meet current compliance requirements. Miguel Spencer. Miguel Gilham, MD Norvel Richards, MD 03/05/2019 2:06:03 PM This report has been signed electronically. Number of Addenda: 0

## 2019-03-05 NOTE — Anesthesia Postprocedure Evaluation (Signed)
Anesthesia Post Note  Patient: Miguel Spencer  Procedure(s) Performed: COLONOSCOPY WITH PROPOFOL (N/A ) BIOPSY POLYPECTOMY  Patient location during evaluation: PACU Anesthesia Type: General Level of consciousness: awake and alert and oriented Pain management: pain level controlled Vital Signs Assessment: post-procedure vital signs reviewed and stable Respiratory status: spontaneous breathing Cardiovascular status: blood pressure returned to baseline and stable Postop Assessment: no apparent nausea or vomiting Anesthetic complications: no     Last Vitals:  Vitals:   03/05/19 1343 03/05/19 1345  BP: 100/86 (!) 103/53  Pulse: (!) 109   Resp: 17 18  Temp: (P) 37.1 C   SpO2: 100%     Last Pain:  Vitals:   03/05/19 1232  TempSrc: Oral  PainSc: 9                  Trena Dunavan

## 2019-03-05 NOTE — Care Management Important Message (Signed)
Important Message  Patient Details  Name: Miguel Spencer MRN: [id number] Date of Birth: [birth year]   Medicare Important Message Given:  Other (see comment)(given to nurse due to patient being on contact precaution)    Tommy Medal 03/05/2019, 3:14 PM

## 2019-03-05 NOTE — Progress Notes (Signed)
Patient seen and examined in short stay.  Hemoglobin 7.4 this morning.  Did not see any blood with colon prep overnight.  Mildly tachycardic.  Discussed with anesthesia.  Plan for diagnostic colonoscopy with possible EGD.  The risks, benefits, limitations, imponderables and alternatives regarding both EGD and colonoscopy have been reviewed with the patient. Questions have been answered. All parties agreeable.   Further recommendations to follow.

## 2019-03-05 NOTE — Progress Notes (Signed)
PROGRESS NOTE    Miguel Spencer  RJJ:[id number]  DOB: [birth year]  DOA: 03/03/2019 PCP: [name], MD   Brief Admission Hx: 68 y/o male on apixaban for PAF, HTN, presents with recurrent GI bleeding and abdominal pain with malaise.   MDM/Assessment & Plan:   1. Rectal bleeding - Pt reports significant amount of rectal bleeding since restarting apixaban and taking indomethacin for gout.  He has been seen by GI and had a colonoscopy done today with findings concerning for ischemic colitis.  Dr. Gala Romney ordered a CTA abd/pelvis for further evaluation.  Continue antibiotics.  Monitor in SDU for hemodynamic instability.  Continue IV protonix.   2. Acute on chronic blood loss anemia - transfuse as needed. Follow CBC.  3. Abdominal pain - LLQ abdominal pain, with recent bout of colitis will obtain another CT abdomen.  4. PAF - He will be maintained on home toprol XL and cardizem CD.  Holding apixaban.  5. Gout - Colchicine ordered.  DC indomethacin.  6. GAD - alprazolam ordered. 7. Myositis / Arthritis - He has been on daily prednisone prescribed by his rheumatologist for last 3 months, it is being weaned down, now down to 6 mg daily.  Don't want to abruptly stop this, will resume at lower dose to continue weaning.   DVT prophylaxis: SCDs Code Status: Full  Family Communication: attempted to speak with uncle no answer, will try again later Disposition Plan: stepdown ICU   Consultants:  GI  Procedures:    Antimicrobials:     Subjective: Pt says his abdomen continues to hurt but overall feels a little better.  He was seen this morning before colonoscopy.    Objective: Vitals:   03/05/19 1343 03/05/19 1345 03/05/19 1400 03/05/19 1402  BP: 100/86 (!) 103/53 (!) 86/74 96/74  Pulse: (!) 109  (!) 112 (!) 106  Resp: 17 18 20 19   Temp: 98.7 F (37.1 C)     TempSrc:      SpO2: 100% 100% 99% 99%  Weight:      Height:        Intake/Output Summary (Last 24 hours) at 03/05/2019 1505  Last data filed at 03/05/2019 1345 Gross per 24 hour  Intake 2792.58 ml  Output 600 ml  Net 2192.58 ml   Filed Weights   03/04/19 0500 03/05/19 0500 03/05/19 1232  Weight: 92.3 kg 92.9 kg 92.9 kg     REVIEW OF SYSTEMS  As per history otherwise all reviewed and reported negative  Exam:  General exam: awake, alert, NAD. Cooperative. Talkative.  Respiratory system: Clear. No increased work of breathing. Cardiovascular system: S1 & S2 heard. No JVD, murmurs, gallops, clicks or pedal edema. Gastrointestinal system: Abdomen is mildly distended diffusely tender but more tender LLQ with guarding. Normal bowel sounds heard. Central nervous system: Alert and oriented. No focal neurological deficits. Extremities: no CCE.  Data Reviewed: Basic Metabolic Panel: Recent Labs  Lab 02/27/19 0316 03/03/19 1611 03/04/19 0449 03/05/19 0427  NA 133* 135 138 136  K 3.6 4.0 3.7 3.5  CL 102 104 109 107  CO2 22 23 22 22   GLUCOSE 92 118* 94 98  BUN 15 20 11 9   CREATININE 0.79 1.18 0.95 1.10  CALCIUM 7.8* 8.3* 7.9* 8.0*   Liver Function Tests: Recent Labs  Lab 03/03/19 1611 03/04/19 0449 03/05/19 0427  AST 16 12* 10*  ALT 24 18 16   ALKPHOS 50 39 40  BILITOT 0.7 0.5 0.6  PROT 6.1* 5.1* 5.3*  ALBUMIN 3.2* 2.6* 2.7*   No results for input(s): LIPASE, AMYLASE in the last 168 hours. No results for input(s): AMMONIA in the last 168 hours. CBC: Recent Labs  Lab 02/28/19 0558 03/03/19 1611 03/03/19 2329 03/04/19 0449 03/05/19 0427  WBC 8.6 8.7 5.8 8.1 6.0  NEUTROABS  --  5.8  --   --   --   HGB 12.2* 8.8* 7.1* 7.7* 7.4*  HCT 37.7* 26.5* 21.8* 23.4* 22.3*  MCV 99.2 96.4 97.8 98.3 97.8  PLT 219 295 224 249 262   Cardiac Enzymes: No results for input(s): CKTOTAL, CKMB, CKMBINDEX, TROPONINI in the last 168 hours. CBG (last 3)  No results for input(s): GLUCAP in the last 72 hours. Recent Results (from the past 240 hour(s))  SARS Coronavirus 2 (CEPHEID - Performed in [place]  hospital lab), Hosp Order     Status: None   Collection Time: 02/25/19 12:15 PM  Result Value Ref Range Status   SARS Coronavirus 2 NEGATIVE NEGATIVE Final    Comment: (NOTE) If result is NEGATIVE SARS-CoV-2 target nucleic acids are NOT DETECTED. The SARS-CoV-2 RNA is generally detectable in upper and lower  respiratory specimens during the acute phase of infection. The lowest  concentration of SARS-CoV-2 viral copies this assay can detect is 250  copies / mL. A negative result does not preclude SARS-CoV-2 infection  and should not be used as the sole basis for treatment or other  patient management decisions.  A negative result may occur with  improper specimen collection / handling, submission of specimen other  than nasopharyngeal swab, presence of viral mutation(s) within the  areas targeted by this assay, and inadequate number of viral copies  (<250 copies / mL). A negative result must be combined with clinical  observations, patient history, and epidemiological information. If result is POSITIVE SARS-CoV-2 target nucleic acids are DETECTED. The SARS-CoV-2 RNA is generally detectable in upper and lower  respiratory specimens dur ing the acute phase of infection.  Positive  results are indicative of active infection with SARS-CoV-2.  Clinical  correlation with patient history and other diagnostic information is  necessary to determine patient infection status.  Positive results do  not rule out bacterial infection or co-infection with other viruses. If result is PRESUMPTIVE POSTIVE SARS-CoV-2 nucleic acids MAY BE PRESENT.   A presumptive positive result was obtained on the submitted specimen  and confirmed on repeat testing.  While 2019 novel coronavirus  (SARS-CoV-2) nucleic acids may be present in the submitted sample  additional confirmatory testing may be necessary for epidemiological  and / or clinical management purposes  to differentiate between  SARS-CoV-2 and other  Sarbecovirus currently known to infect humans.  If clinically indicated additional testing with an alternate test  methodology [phone]) is advised. The SARS-CoV-2 RNA is generally  detectable in upper and lower respiratory sp ecimens during the acute  phase of infection. The expected result is Negative. Fact Sheet for Patients:  StrictlyIdeas.no Fact Sheet for Healthcare Providers: BankingDealers.co.za This test is not yet approved or cleared by the [place] FDA and has been authorized for detection and/or diagnosis of SARS-CoV-2 by FDA under an Emergency Use Authorization (EUA).  This EUA will remain in effect (meaning this test can be used) for the duration of the COVID-19 declaration under Section 564(b)(1) of the Act, 21 U.S.C. section 360bbb-3(b)(1), unless the authorization is terminated or revoked sooner. Performed at [facility], [address] [place], [place] [address]   MRSA PCR Screening  Status: None   Collection Time: 02/25/19  3:52 PM  Result Value Ref Range Status   MRSA by PCR NEGATIVE NEGATIVE Final    Comment:        The GeneXpert MRSA Assay (FDA approved for NASAL specimens only), is one component of a comprehensive MRSA colonization surveillance program. It is not intended to diagnose MRSA infection nor to guide or monitor treatment for MRSA infections. Performed at [facility], [address], [place], [place] [address]   Gastrointestinal Panel by PCR , Stool     Status: None   Collection Time: 02/28/19  2:00 AM  Result Value Ref Range Status   Campylobacter species NOT DETECTED NOT DETECTED Final   Plesimonas shigelloides NOT DETECTED NOT DETECTED Final   Salmonella species NOT DETECTED NOT DETECTED Final   Yersinia enterocolitica NOT DETECTED NOT DETECTED Final   Vibrio species NOT DETECTED NOT DETECTED Final   Vibrio cholerae NOT DETECTED NOT DETECTED Final   Enteroaggregative E coli (EAEC)  NOT DETECTED NOT DETECTED Final   Enteropathogenic E coli (EPEC) NOT DETECTED NOT DETECTED Final   Enterotoxigenic E coli (ETEC) NOT DETECTED NOT DETECTED Final   Shiga like toxin producing E coli (STEC) NOT DETECTED NOT DETECTED Final   Shigella/Enteroinvasive E coli (EIEC) NOT DETECTED NOT DETECTED Final   Cryptosporidium NOT DETECTED NOT DETECTED Final   Cyclospora cayetanensis NOT DETECTED NOT DETECTED Final   Entamoeba histolytica NOT DETECTED NOT DETECTED Final   Giardia lamblia NOT DETECTED NOT DETECTED Final   Adenovirus F40/41 NOT DETECTED NOT DETECTED Final   Astrovirus NOT DETECTED NOT DETECTED Final   Norovirus GI/GII NOT DETECTED NOT DETECTED Final   Rotavirus A NOT DETECTED NOT DETECTED Final   Sapovirus (I, II, IV, and V) NOT DETECTED NOT DETECTED Final    Comment: Performed at [facility], [address], [place], [place] [address]  SARS Coronavirus 2 (CEPHEID - Performed in [place] hospital lab), Hosp Order     Status: None   Collection Time: 03/03/19  6:05 PM  Result Value Ref Range Status   SARS Coronavirus 2 NEGATIVE NEGATIVE Final    Comment: (NOTE) If result is NEGATIVE SARS-CoV-2 target nucleic acids are NOT DETECTED. The SARS-CoV-2 RNA is generally detectable in upper and lower  respiratory specimens during the acute phase of infection. The lowest  concentration of SARS-CoV-2 viral copies this assay can detect is 250  copies / mL. A negative result does not preclude SARS-CoV-2 infection  and should not be used as the sole basis for treatment or other  patient management decisions.  A negative result may occur with  improper specimen collection / handling, submission of specimen other  than nasopharyngeal swab, presence of viral mutation(s) within the  areas targeted by this assay, and inadequate number of viral copies  (<250 copies / mL). A negative result must be combined with clinical  observations, patient history, and epidemiological  information. If result is POSITIVE SARS-CoV-2 target nucleic acids are DETECTED. The SARS-CoV-2 RNA is generally detectable in upper and lower  respiratory specimens dur ing the acute phase of infection.  Positive  results are indicative of active infection with SARS-CoV-2.  Clinical  correlation with patient history and other diagnostic information is  necessary to determine patient infection status.  Positive results do  not rule out bacterial infection or co-infection with other viruses. If result is PRESUMPTIVE POSTIVE SARS-CoV-2 nucleic acids MAY BE PRESENT.   A presumptive positive result was obtained on the  submitted specimen  and confirmed on repeat testing.  While 2019 novel coronavirus  (SARS-CoV-2) nucleic acids may be present in the submitted sample  additional confirmatory testing may be necessary for epidemiological  and / or clinical management purposes  to differentiate between  SARS-CoV-2 and other Sarbecovirus currently known to infect humans.  If clinically indicated additional testing with an alternate test  methodology [phone]) is advised. The SARS-CoV-2 RNA is generally  detectable in upper and lower respiratory sp ecimens during the acute  phase of infection. The expected result is Negative. Fact Sheet for Patients:  StrictlyIdeas.no Fact Sheet for Healthcare Providers: BankingDealers.co.za This test is not yet approved or cleared by the [place] FDA and has been authorized for detection and/or diagnosis of SARS-CoV-2 by FDA under an Emergency Use Authorization (EUA).  This EUA will remain in effect (meaning this test can be used) for the duration of the COVID-19 declaration under Section 564(b)(1) of the Act, 21 U.S.C. section 360bbb-3(b)(1), unless the authorization is terminated or revoked sooner. Performed at [facility], [address] [place], [place] [address]   Gastrointestinal Panel by PCR , Stool      Status: None   Collection Time: 03/03/19  9:39 PM  Result Value Ref Range Status   Campylobacter species NOT DETECTED NOT DETECTED Final   Plesimonas shigelloides NOT DETECTED NOT DETECTED Final   Salmonella species NOT DETECTED NOT DETECTED Final   Yersinia enterocolitica NOT DETECTED NOT DETECTED Final   Vibrio species NOT DETECTED NOT DETECTED Final   Vibrio cholerae NOT DETECTED NOT DETECTED Final   Enteroaggregative E coli (EAEC) NOT DETECTED NOT DETECTED Final   Enteropathogenic E coli (EPEC) NOT DETECTED NOT DETECTED Final   Enterotoxigenic E coli (ETEC) NOT DETECTED NOT DETECTED Final   Shiga like toxin producing E coli (STEC) NOT DETECTED NOT DETECTED Final   Shigella/Enteroinvasive E coli (EIEC) NOT DETECTED NOT DETECTED Final   Cryptosporidium NOT DETECTED NOT DETECTED Final   Cyclospora cayetanensis NOT DETECTED NOT DETECTED Final   Entamoeba histolytica NOT DETECTED NOT DETECTED Final   Giardia lamblia NOT DETECTED NOT DETECTED Final   Adenovirus F40/41 NOT DETECTED NOT DETECTED Final   Astrovirus NOT DETECTED NOT DETECTED Final   Norovirus GI/GII NOT DETECTED NOT DETECTED Final   Rotavirus A NOT DETECTED NOT DETECTED Final   Sapovirus (I, II, IV, and V) NOT DETECTED NOT DETECTED Final    Comment: Performed at [facility], [address] [place], [place] [address]  C difficile quick scan w PCR reflex     Status: None   Collection Time: 03/03/19  9:39 PM  Result Value Ref Range Status   C Diff antigen NEGATIVE NEGATIVE Final   C Diff toxin NEGATIVE NEGATIVE Final   C Diff interpretation No C. difficile detected.  Final    Comment: Performed at [facility], [address] [place], [place] [address]     Studies: Ct Abdomen Pelvis W Contrast  Result Date: 03/04/2019 CLINICAL DATA:  Rectal bleeding. EXAM: CT ABDOMEN AND PELVIS WITH CONTRAST TECHNIQUE: Multidetector CT imaging of the abdomen and pelvis was performed using the standard protocol following  bolus administration of intravenous contrast. CONTRAST:  31mL OMNIPAQUE IOHEXOL 300 MG/ML SOLN, 112mL OMNIPAQUE IOHEXOL 300 MG/ML SOLN COMPARISON:  CT scan of Feb 25, 2019. FINDINGS: Lower chest: No acute abnormality. Hepatobiliary: No focal liver abnormality is seen. No gallstones, gallbladder wall thickening, or biliary dilatation. Pancreas: Unremarkable. No pancreatic ductal dilatation or surrounding inflammatory changes. Spleen: Normal  in size without focal abnormality. Adrenals/Urinary Tract: Adrenal glands are unremarkable. Kidneys are normal, without renal calculi, focal lesion, or hydronephrosis. Bladder is unremarkable. Stomach/Bowel: The stomach appears normal. There is no evidence of bowel obstruction. The appendix appears normal. Wall thickening with surrounding inflammatory changes is seen involving the right colon consistent with infectious or inflammatory colitis. This may not be significantly changed compared to prior exam. Vascular/Lymphatic: Aortic atherosclerosis. No enlarged abdominal or pelvic lymph nodes. Reproductive: Prostate is unremarkable. Other: Small fat containing periumbilical hernia is noted. No abnormal fluid collection is noted. Musculoskeletal: No acute or significant osseous findings. IMPRESSION: Findings consistent with infectious or inflammatory right-sided colitis. Aortic Atherosclerosis (ICD10-I70.0). Electronically Signed   By: Marijo Conception M.D.   On: 03/04/2019 15:43    Scheduled Meds: . Chlorhexidine Gluconate Cloth  6 each Topical Daily  . diltiazem  180 mg Oral Daily  . metoprolol succinate  100 mg Oral Daily  . predniSONE  4 mg Oral Q breakfast  . sodium chloride flush  3 mL Intravenous Q12H   Continuous Infusions: . sodium chloride    . pantoprozole (PROTONIX) infusion 8 mg/hr (03/05/19 0317)  . piperacillin-tazobactam (ZOSYN)  IV 3.375 g (03/05/19 0533)    Active Problems:   Hypertension   Gout   Anemia   GI bleeding   AF (paroxysmal atrial  fibrillation) (HCC)   Acute blood loss anemia   Acute GI bleeding  Critical CareTime spent: 30 minutes  Irwin Brakeman, MD [facility] 03/05/2019, 3:05 PM    LOS: 1 day  How to contact the [facility] Attending or Consulting provider [place] or covering provider during after hours [place], for this patient?  1. Check the care team in [facility] and look for a) attending/consulting [facility] provider listed and b) the [facility] team listed 2. Log into [email or web address] and use [place]'s universal password to access. If you do not have the password, please contact the hospital operator. 3. Locate the [facility] provider you are looking for under [facility] and page to a number that you can be directly reached. 4. If you still have difficulty reaching the provider, please page the [facility] (Director on Call) for the Hospitalists listed on amion for assistance.

## 2019-03-06 DIAGNOSIS — K922 Gastrointestinal hemorrhage, unspecified: Secondary | ICD-10-CM

## 2019-03-06 LAB — CBC
HCT: 22.3 % — ABNORMAL LOW (ref 39.0–52.0)
HCT: 22.3 % — ABNORMAL LOW (ref 39.0–52.0)
Hemoglobin: 7.1 g/dL — ABNORMAL LOW (ref 13.0–17.0)
Hemoglobin: 7.3 g/dL — ABNORMAL LOW (ref 13.0–17.0)
MCH: 31.8 pg (ref 26.0–34.0)
MCH: 32.3 pg (ref 26.0–34.0)
MCHC: 31.8 g/dL (ref 30.0–36.0)
MCHC: 32.7 g/dL (ref 30.0–36.0)
MCV: 100 fL (ref 80.0–100.0)
MCV: 98.7 fL (ref 80.0–100.0)
Platelets: 281 10*3/uL (ref 150–400)
Platelets: 283 10*3/uL (ref 150–400)
RBC: 2.23 MIL/uL — ABNORMAL LOW (ref 4.22–5.81)
RBC: 2.26 MIL/uL — ABNORMAL LOW (ref 4.22–5.81)
RDW: 14.1 % (ref 11.5–15.5)
RDW: 14.4 % (ref 11.5–15.5)
WBC: 5.7 10*3/uL (ref 4.0–10.5)
WBC: 6 10*3/uL (ref 4.0–10.5)
nRBC: 0 % (ref 0.0–0.2)
nRBC: 0 % (ref 0.0–0.2)

## 2019-03-06 LAB — COMPREHENSIVE METABOLIC PANEL
ALT: 14 U/L (ref 0–44)
AST: 11 U/L — ABNORMAL LOW (ref 15–41)
Albumin: 2.5 g/dL — ABNORMAL LOW (ref 3.5–5.0)
Alkaline Phosphatase: 35 U/L — ABNORMAL LOW (ref 38–126)
Anion gap: 6 (ref 5–15)
BUN: 9 mg/dL (ref 8–23)
CO2: 22 mmol/L (ref 22–32)
Calcium: 7.9 mg/dL — ABNORMAL LOW (ref 8.9–10.3)
Chloride: 107 mmol/L (ref 98–111)
Creatinine, Ser: 1.2 mg/dL (ref 0.61–1.24)
GFR calc Af Amer: >60 mL/min (ref >60)
GFR calc non Af Amer: >60 mL/min (ref >60)
Glucose, Bld: 127 mg/dL — ABNORMAL HIGH (ref 70–99)
Potassium: 3.3 mmol/L — ABNORMAL LOW (ref 3.5–5.1)
Sodium: 135 mmol/L (ref 135–145)
Total Bilirubin: <0.1 mg/dL — ABNORMAL LOW (ref 0.3–1.2)
Total Protein: 4.9 g/dL — ABNORMAL LOW (ref 6.5–8.1)

## 2019-03-06 MED ORDER — POTASSIUM CHLORIDE CRYS ER 20 MEQ PO TBCR
40.0000 meq | EXTENDED_RELEASE_TABLET | Freq: Once | ORAL | Status: AC
  Administered 2019-03-06: 40 meq via ORAL
  Filled 2019-03-06: qty 2

## 2019-03-06 MED ORDER — PANTOPRAZOLE SODIUM 40 MG PO TBEC
40.0000 mg | DELAYED_RELEASE_TABLET | Freq: Two times a day (BID) | ORAL | Status: DC
  Administered 2019-03-06 – 2019-03-10 (×8): 40 mg via ORAL
  Filled 2019-03-06 (×8): qty 1

## 2019-03-06 MED ORDER — LORAZEPAM 2 MG/ML IJ SOLN
0.5000 mg | INTRAMUSCULAR | Status: DC | PRN
  Administered 2019-03-07: 0.5 mg via INTRAVENOUS
  Filled 2019-03-06: qty 1

## 2019-03-06 MED ORDER — SODIUM CHLORIDE 0.9 % IV SOLN
INTRAVENOUS | Status: DC | PRN
  Administered 2019-03-06: 250 mL via INTRAVENOUS

## 2019-03-06 MED ORDER — COLCHICINE 0.6 MG PO TABS
0.6000 mg | ORAL_TABLET | Freq: Every day | ORAL | Status: DC
  Administered 2019-03-06 – 2019-03-10 (×5): 0.6 mg via ORAL
  Filled 2019-03-06 (×5): qty 1

## 2019-03-06 NOTE — Progress Notes (Addendum)
Subjective: Doesn't have much of an appetite. Feels tired. Tried to eat two bites of food earlier and didn't want to finish. Would like to try pudding. Abdominal pain resolved. No overt GI bleeding. Somewhat anxious and would like to have Ativan as needed.   Objective: Vital signs in last 24 hours: Temp:  [97.6 F (36.4 C)-98.7 F (37.1 C)] 98.5 F (36.9 C) (05/29 2000) Pulse Rate:  [64-133] 65 (05/30 0700) Resp:  [15-21] 15 (05/30 0700) BP: (63-113)/(44-89) 97/70 (05/30 0700) SpO2:  [92 %-100 %] 99 % (05/30 0700) Weight:  [92.9 kg] 92.9 kg (05/29 1232) Last BM Date: 03/04/19 General:   Alert and oriented, pleasant Head:  Normocephalic and atraumatic. Abdomen:  Bowel sounds present, soft, non-tender, non-distended. Small umbilical hernia Neurologic:  Alert and  oriented x4  Intake/Output from previous day: 05/29 0701 - 05/30 0700 In: 2750.2 [I.V.:2094; IV Piggyback:656.2] Out: 0  Intake/Output this shift: No intake/output data recorded.  Lab Results: Recent Labs    03/05/19 0427 03/05/19 2349 03/06/19 0433  WBC 6.0 6.0 5.7  HGB 7.4* 7.1* 7.3*  HCT 22.3* 22.3* 22.3*  PLT 262 283 281   BMET Recent Labs    03/04/19 0449 03/05/19 0427 03/06/19 0433  NA 138 136 135  K 3.7 3.5 3.3*  CL 109 107 107  CO2 22 22 22   GLUCOSE 94 98 127*  BUN 11 9 9   CREATININE 0.95 1.10 1.20  CALCIUM 7.9* 8.0* 7.9*   LFT Recent Labs    03/04/19 0449 03/05/19 0427 03/06/19 0433  PROT 5.1* 5.3* 4.9*  ALBUMIN 2.6* 2.7* 2.5*  AST 12* 10* 11*  ALT 18 16 14   ALKPHOS 39 40 35*  BILITOT 0.5 0.6 <0.1*   PT/INR Recent Labs    03/03/19 1611  LABPROT 19.6*  INR 1.7*     Studies/Results: Ct Abdomen Pelvis W Contrast  Result Date: 03/04/2019 CLINICAL DATA:  Rectal bleeding. EXAM: CT ABDOMEN AND PELVIS WITH CONTRAST TECHNIQUE: Multidetector CT imaging of the abdomen and pelvis was performed using the standard protocol following bolus administration of intravenous contrast.  CONTRAST:  13mL OMNIPAQUE IOHEXOL 300 MG/ML SOLN, 150mL OMNIPAQUE IOHEXOL 300 MG/ML SOLN COMPARISON:  CT scan of Feb 25, 2019. FINDINGS: Lower chest: No acute abnormality. Hepatobiliary: No focal liver abnormality is seen. No gallstones, gallbladder wall thickening, or biliary dilatation. Pancreas: Unremarkable. No pancreatic ductal dilatation or surrounding inflammatory changes. Spleen: Normal in size without focal abnormality. Adrenals/Urinary Tract: Adrenal glands are unremarkable. Kidneys are normal, without renal calculi, focal lesion, or hydronephrosis. Bladder is unremarkable. Stomach/Bowel: The stomach appears normal. There is no evidence of bowel obstruction. The appendix appears normal. Wall thickening with surrounding inflammatory changes is seen involving the right colon consistent with infectious or inflammatory colitis. This may not be significantly changed compared to prior exam. Vascular/Lymphatic: Aortic atherosclerosis. No enlarged abdominal or pelvic lymph nodes. Reproductive: Prostate is unremarkable. Other: Small fat containing periumbilical hernia is noted. No abnormal fluid collection is noted. Musculoskeletal: No acute or significant osseous findings. IMPRESSION: Findings consistent with infectious or inflammatory right-sided colitis. Aortic Atherosclerosis (ICD10-I70.0). Electronically Signed   By: Marijo Conception M.D.   On: 03/04/2019 15:43   Ct Angio Abd/pel W/ And/or W/o  Result Date: 03/05/2019 CLINICAL DATA:  Concern for mesenteric ischemia EXAM: CTA ABDOMEN AND PELVIS wITHOUT AND WITH CONTRAST TECHNIQUE: Multidetector CT imaging of the abdomen and pelvis was performed using the standard protocol during bolus administration of intravenous contrast. Multiplanar reconstructed images and MIPs were  obtained and reviewed to evaluate the vascular anatomy. CONTRAST:  148mL OMNIPAQUE IOHEXOL 350 MG/ML SOLN COMPARISON:  03/04/2019 FINDINGS: VASCULAR Aorta: Nonaneurysmal and patent. Celiac:  Patent.  Branch vessels are patent. SMA: Patent. There is smooth soft and calcified plaque at the origin without significant narrowing. Branch vessels are grossly patent. Renals: A single right renal artery is patent with atherosclerotic calcified plaque at the origin. There are 2 left renal arteries which are patent. IMA: Patent. Inflow: Bilateral common, internal, and external iliac arteries are patent with scattered calcified atherosclerotic plaque. Proximal Outflow: Grossly patent bilaterally with atherosclerotic plaque. No significant narrowing. Veins: No evidence of DVT. Review of the MIP images confirms the above findings. NON-VASCULAR Lower chest: Dependent atelectasis bilaterally. Hepatobiliary: Unremarkable appearance of the liver. Gallbladder is mildly distended without obvious wall thickening or gallstones. Pancreas: Unremarkable Spleen: Unremarkable Adrenals/Urinary Tract: Adrenal glands are within normal limits. Kidneys are within normal limits in appearance. Bladder is distended. Stomach/Bowel: There is persistent wall thickening involving the ascending colon and hepatic flexure. No evidence of wall thickening or mass in the transverse or descending colon. Sigmoid colon is unremarkable and decompressed. There is no evidence of pneumatosis. There is stranding in the fat adjacent to the ascending colon and hepatic flexure. No extraluminal bowel gas to suggest rupture. No evidence of abscess formation. Small bowel is decompressed.  Stomach is also decompressed. Lymphatic: No abnormal retroperitoneal adenopathy. Reproductive: Prostate is within normal limits. Other: No free-fluid. No free intraperitoneal gas. Postoperative changes from right inguinal herniorrhaphy are noted. Musculoskeletal: T10 and T11 mild wedge compression deformities have a chronic appearance. They are new compared with imaging from 2014. IMPRESSION: VASCULAR No significant narrowing in the visceral vasculature is identified to cause  mesenteric ischemia. Atherosclerotic changes are noted. NON-VASCULAR Persistent wall thickening and inflammatory change involving the ascending colon and hepatic flexure. No evidence of perforation or pneumatosis. Electronically Signed   By: Marybelle Killings M.D.   On: 03/05/2019 15:51    Assessment: 68 year old male admitted with GI bleed on Eliquis, s/p colonoscopy with extensive ascending colon ulceration concerning for ischemia. CTA with atherosclerotic changes but no significant stenosis. Hgb stable. Abdominal pain resolved, no overt GI bleeding. Feels weak with little appetite but willing to try pudding. Several other concerns addressed with patient regarding management of gout and anxiety: will relay to hospitalist.   Plan: Low residue diet May change PPI infusion to BID Monitor for further overt GI bleeding, recurrent abdominal pain Await biopsies for colonoscopy Continue to hold Eliquis for now   Annitta Needs, PhD, ANP-BC [facility]    LOS: 2 days    03/06/2019, 8:56 AM  Attending note:  Agree with above; no obvious clot or critical stenosis on CTA;  End-organ ischemia not excluded; TCS findings atypical for IBD or NSAID effect.  Hopefully, bx's will help sort out.  Will make further recommendations regarding anticoagulation once bx report available.

## 2019-03-06 NOTE — Progress Notes (Signed)
PROGRESS NOTE  ANGEL WEEDON  JKD:[id number]  DOB: Apr 19, 1951  DOA: 03/03/2019 PCP: [name], MD  Brief Admission Hx: 68 y/o male on apixaban for PAF, HTN, presents with recurrent GI bleeding and abdominal pain with malaise.   MDM/Assessment & Plan:   1. Rectal bleeding / Colitis -  Pt reports significant amount of rectal bleeding since restarting apixaban and taking indomethacin for gout.  He has been seen by GI and had a colonoscopy done 03/05/19 with findings concerning for ischemic colitis, however CTA abd/pelvis with no signs of vascular compromise.    Continue antibiotics.   Continue protonix.   2. Acute on chronic blood loss anemia - transfuse as needed. Follow CBC.  Hg stable at 7.3. Holding apixaban.  3. Abdominal pain - LLQ abdominal pain seems to be improving with antibiotics.  4. PAF - He will be maintained on home toprol XL and cardizem CD.  Holding apixaban.  5. Gout - Colchicine ordered and symptoms much improved.  Discontinued indomethacin.  6. GAD - alprazolam ordered as needed. 7. Myositis / Arthritis - He has been on daily prednisone prescribed by his rheumatologist for last 3 months, it is being weaned down, now down to 6 mg daily.  Don't want to abruptly stop this, resumed at lower dose to continue weaning.  Pt to follow up with rheumatologist to continue to wean off prednisone.    DVT prophylaxis: SCDs Code Status: Full  Family Communication: updated uncle telephone Disposition Plan: transfer to med surg  Consultants:  GI  Procedures:    Antimicrobials:     Subjective: Pt complains of poor appetite and bloating of stomach but abdominal pain improving.  No rectal bleeding.    Objective: Vitals:   03/06/19 0500 03/06/19 0600 03/06/19 0700 03/06/19 0915  BP: 98/73 97/76 97/70    Pulse: 66 65 65   Resp: 15 15 15    Temp:    98.4 F (36.9 C)  TempSrc:    Oral  SpO2: 98% 96% 99%   Weight:      Height:        Intake/Output Summary (Last 24 hours)  at 03/06/2019 1041 Last data filed at 03/06/2019 0935 Gross per 24 hour  Intake 2760.19 ml  Output 0 ml  Net 2760.19 ml   Filed Weights   03/04/19 0500 03/05/19 0500 03/05/19 1232  Weight: 92.3 kg 92.9 kg 92.9 kg   REVIEW OF SYSTEMS  As per history otherwise all reviewed and reported negative  Exam:  General exam: awake, alert, NAD. Cooperative.   Respiratory system: Clear. No increased work of breathing. Cardiovascular system: S1 & S2 heard. No JVD, murmurs, gallops, clicks or pedal edema. Gastrointestinal system: Abdomen is soft and nontender. Normal bowel sounds heard. Central nervous system: Alert and oriented. No focal neurological deficits. Extremities: no CCE.  Data Reviewed: Basic Metabolic Panel: Recent Labs  Lab 03/03/19 1611 03/04/19 0449 03/05/19 0427 03/06/19 0433  NA 135 138 136 135  K 4.0 3.7 3.5 3.3*  CL 104 109 107 107  CO2 23 22 22 22   GLUCOSE 118* 94 98 127*  BUN 20 11 9 9   CREATININE 1.18 0.95 1.10 1.20  CALCIUM 8.3* 7.9* 8.0* 7.9*   Liver Function Tests: Recent Labs  Lab 03/03/19 1611 03/04/19 0449 03/05/19 0427 03/06/19 0433  AST 16 12* 10* 11*  ALT 24 18 16 14   ALKPHOS 50 39 40 35*  BILITOT 0.7 0.5 0.6 <0.1*  PROT 6.1* 5.1* 5.3* 4.9*  ALBUMIN 3.2*  2.6* 2.7* 2.5*   No results for input(s): LIPASE, AMYLASE in the last 168 hours. No results for input(s): AMMONIA in the last 168 hours. CBC: Recent Labs  Lab 03/03/19 1611 03/03/19 2329 03/04/19 0449 03/05/19 0427 03/05/19 2349 03/06/19 0433  WBC 8.7 5.8 8.1 6.0 6.0 5.7  NEUTROABS 5.8  --   --   --   --   --   HGB 8.8* 7.1* 7.7* 7.4* 7.1* 7.3*  HCT 26.5* 21.8* 23.4* 22.3* 22.3* 22.3*  MCV 96.4 97.8 98.3 97.8 100.0 98.7  PLT 295 224 249 262 283 281   Cardiac Enzymes: No results for input(s): CKTOTAL, CKMB, CKMBINDEX, TROPONINI in the last 168 hours. CBG (last 3)  No results for input(s): GLUCAP in the last 72 hours. Recent Results (from the past 240 hour(s))  SARS Coronavirus  2 (CEPHEID - Performed in [place] hospital lab), Hosp Order     Status: None   Collection Time: 02/25/19 12:15 PM  Result Value Ref Range Status   SARS Coronavirus 2 NEGATIVE NEGATIVE Final    Comment: (NOTE) If result is NEGATIVE SARS-CoV-2 target nucleic acids are NOT DETECTED. The SARS-CoV-2 RNA is generally detectable in upper and lower  respiratory specimens during the acute phase of infection. The lowest  concentration of SARS-CoV-2 viral copies this assay can detect is 250  copies / mL. A negative result does not preclude SARS-CoV-2 infection  and should not be used as the sole basis for treatment or other  patient management decisions.  A negative result may occur with  improper specimen collection / handling, submission of specimen other  than nasopharyngeal swab, presence of viral mutation(s) within the  areas targeted by this assay, and inadequate number of viral copies  (<250 copies / mL). A negative result must be combined with clinical  observations, patient history, and epidemiological information. If result is POSITIVE SARS-CoV-2 target nucleic acids are DETECTED. The SARS-CoV-2 RNA is generally detectable in upper and lower  respiratory specimens dur ing the acute phase of infection.  Positive  results are indicative of active infection with SARS-CoV-2.  Clinical  correlation with patient history and other diagnostic information is  necessary to determine patient infection status.  Positive results do  not rule out bacterial infection or co-infection with other viruses. If result is PRESUMPTIVE POSTIVE SARS-CoV-2 nucleic acids MAY BE PRESENT.   A presumptive positive result was obtained on the submitted specimen  and confirmed on repeat testing.  While 2019 novel coronavirus  (SARS-CoV-2) nucleic acids may be present in the submitted sample  additional confirmatory testing may be necessary for epidemiological  and / or clinical management purposes  to  differentiate between  SARS-CoV-2 and other Sarbecovirus currently known to infect humans.  If clinically indicated additional testing with an alternate test  methodology [phone]) is advised. The SARS-CoV-2 RNA is generally  detectable in upper and lower respiratory sp ecimens during the acute  phase of infection. The expected result is Negative. Fact Sheet for Patients:  StrictlyIdeas.no Fact Sheet for Healthcare Providers: BankingDealers.co.za This test is not yet approved or cleared by the [place] FDA and has been authorized for detection and/or diagnosis of SARS-CoV-2 by FDA under an Emergency Use Authorization (EUA).  This EUA will remain in effect (meaning this test can be used) for the duration of the COVID-19 declaration under Section 564(b)(1) of the Act, 21 U.S.C. section 360bbb-3(b)(1), unless the authorization is terminated or revoked sooner. Performed at [facility], [address],  [place], [place] [address]   MRSA PCR Screening     Status: None   Collection Time: 02/25/19  3:52 PM  Result Value Ref Range Status   MRSA by PCR NEGATIVE NEGATIVE Final    Comment:        The GeneXpert MRSA Assay (FDA approved for NASAL specimens only), is one component of a comprehensive MRSA colonization surveillance program. It is not intended to diagnose MRSA infection nor to guide or monitor treatment for MRSA infections. Performed at [facility], [address], [place], [place] [address]   Gastrointestinal Panel by PCR , Stool     Status: None   Collection Time: 02/28/19  2:00 AM  Result Value Ref Range Status   Campylobacter species NOT DETECTED NOT DETECTED Final   Plesimonas shigelloides NOT DETECTED NOT DETECTED Final   Salmonella species NOT DETECTED NOT DETECTED Final   Yersinia enterocolitica NOT DETECTED NOT DETECTED Final   Vibrio species NOT DETECTED NOT DETECTED Final   Vibrio cholerae NOT DETECTED NOT  DETECTED Final   Enteroaggregative E coli (EAEC) NOT DETECTED NOT DETECTED Final   Enteropathogenic E coli (EPEC) NOT DETECTED NOT DETECTED Final   Enterotoxigenic E coli (ETEC) NOT DETECTED NOT DETECTED Final   Shiga like toxin producing E coli (STEC) NOT DETECTED NOT DETECTED Final   Shigella/Enteroinvasive E coli (EIEC) NOT DETECTED NOT DETECTED Final   Cryptosporidium NOT DETECTED NOT DETECTED Final   Cyclospora cayetanensis NOT DETECTED NOT DETECTED Final   Entamoeba histolytica NOT DETECTED NOT DETECTED Final   Giardia lamblia NOT DETECTED NOT DETECTED Final   Adenovirus F40/41 NOT DETECTED NOT DETECTED Final   Astrovirus NOT DETECTED NOT DETECTED Final   Norovirus GI/GII NOT DETECTED NOT DETECTED Final   Rotavirus A NOT DETECTED NOT DETECTED Final   Sapovirus (I, II, IV, and V) NOT DETECTED NOT DETECTED Final    Comment: Performed at [facility], [address] [place], [place] [address]  SARS Coronavirus 2 (CEPHEID - Performed in [place] hospital lab), Hosp Order     Status: None   Collection Time: 03/03/19  6:05 PM  Result Value Ref Range Status   SARS Coronavirus 2 NEGATIVE NEGATIVE Final    Comment: (NOTE) If result is NEGATIVE SARS-CoV-2 target nucleic acids are NOT DETECTED. The SARS-CoV-2 RNA is generally detectable in upper and lower  respiratory specimens during the acute phase of infection. The lowest  concentration of SARS-CoV-2 viral copies this assay can detect is 250  copies / mL. A negative result does not preclude SARS-CoV-2 infection  and should not be used as the sole basis for treatment or other  patient management decisions.  A negative result may occur with  improper specimen collection / handling, submission of specimen other  than nasopharyngeal swab, presence of viral mutation(s) within the  areas targeted by this assay, and inadequate number of viral copies  (<250 copies / mL). A negative result must be combined with clinical    observations, patient history, and epidemiological information. If result is POSITIVE SARS-CoV-2 target nucleic acids are DETECTED. The SARS-CoV-2 RNA is generally detectable in upper and lower  respiratory specimens dur ing the acute phase of infection.  Positive  results are indicative of active infection with SARS-CoV-2.  Clinical  correlation with patient history and other diagnostic information is  necessary to determine patient infection status.  Positive results do  not rule out bacterial infection or co-infection with other viruses. If result is PRESUMPTIVE POSTIVE SARS-CoV-2 nucleic acids  MAY BE PRESENT.   A presumptive positive result was obtained on the submitted specimen  and confirmed on repeat testing.  While 2019 novel coronavirus  (SARS-CoV-2) nucleic acids may be present in the submitted sample  additional confirmatory testing may be necessary for epidemiological  and / or clinical management purposes  to differentiate between  SARS-CoV-2 and other Sarbecovirus currently known to infect humans.  If clinically indicated additional testing with an alternate test  methodology [phone]) is advised. The SARS-CoV-2 RNA is generally  detectable in upper and lower respiratory sp ecimens during the acute  phase of infection. The expected result is Negative. Fact Sheet for Patients:  StrictlyIdeas.no Fact Sheet for Healthcare Providers: BankingDealers.co.za This test is not yet approved or cleared by the [place] FDA and has been authorized for detection and/or diagnosis of SARS-CoV-2 by FDA under an Emergency Use Authorization (EUA).  This EUA will remain in effect (meaning this test can be used) for the duration of the COVID-19 declaration under Section 564(b)(1) of the Act, 21 U.S.C. section 360bbb-3(b)(1), unless the authorization is terminated or revoked sooner. Performed at [facility] [address]  [place], [place] [address]   Gastrointestinal Panel by PCR , Stool     Status: None   Collection Time: 03/03/19  9:39 PM  Result Value Ref Range Status   Campylobacter species NOT DETECTED NOT DETECTED Final   Plesimonas shigelloides NOT DETECTED NOT DETECTED Final   Salmonella species NOT DETECTED NOT DETECTED Final   Yersinia enterocolitica NOT DETECTED NOT DETECTED Final   Vibrio species NOT DETECTED NOT DETECTED Final   Vibrio cholerae NOT DETECTED NOT DETECTED Final   Enteroaggregative E coli (EAEC) NOT DETECTED NOT DETECTED Final   Enteropathogenic E coli (EPEC) NOT DETECTED NOT DETECTED Final   Enterotoxigenic E coli (ETEC) NOT DETECTED NOT DETECTED Final   Shiga like toxin producing E coli (STEC) NOT DETECTED NOT DETECTED Final   Shigella/Enteroinvasive E coli (EIEC) NOT DETECTED NOT DETECTED Final   Cryptosporidium NOT DETECTED NOT DETECTED Final   Cyclospora cayetanensis NOT DETECTED NOT DETECTED Final   Entamoeba histolytica NOT DETECTED NOT DETECTED Final   Giardia lamblia NOT DETECTED NOT DETECTED Final   Adenovirus F40/41 NOT DETECTED NOT DETECTED Final   Astrovirus NOT DETECTED NOT DETECTED Final   Norovirus GI/GII NOT DETECTED NOT DETECTED Final   Rotavirus A NOT DETECTED NOT DETECTED Final   Sapovirus (I, II, IV, and V) NOT DETECTED NOT DETECTED Final    Comment: Performed at [facility], [address] [place], [place] [address]  C difficile quick scan w PCR reflex     Status: None   Collection Time: 03/03/19  9:39 PM  Result Value Ref Range Status   C Diff antigen NEGATIVE NEGATIVE Final   C Diff toxin NEGATIVE NEGATIVE Final   C Diff interpretation No C. difficile detected.  Final    Comment: Performed at [facility], [address], [place], [place] [address]     Studies: Ct Abdomen Pelvis W Contrast  Result Date: 03/04/2019 CLINICAL DATA:  Rectal bleeding. EXAM: CT ABDOMEN AND PELVIS WITH CONTRAST TECHNIQUE: Multidetector CT imaging of the abdomen and  pelvis was performed using the standard protocol following bolus administration of intravenous contrast. CONTRAST:  64mL OMNIPAQUE IOHEXOL 300 MG/ML SOLN, 172mL OMNIPAQUE IOHEXOL 300 MG/ML SOLN COMPARISON:  CT scan of Feb 25, 2019. FINDINGS: Lower chest: No acute abnormality. Hepatobiliary: No focal liver abnormality is seen. No gallstones, gallbladder wall thickening, or biliary  dilatation. Pancreas: Unremarkable. No pancreatic ductal dilatation or surrounding inflammatory changes. Spleen: Normal in size without focal abnormality. Adrenals/Urinary Tract: Adrenal glands are unremarkable. Kidneys are normal, without renal calculi, focal lesion, or hydronephrosis. Bladder is unremarkable. Stomach/Bowel: The stomach appears normal. There is no evidence of bowel obstruction. The appendix appears normal. Wall thickening with surrounding inflammatory changes is seen involving the right colon consistent with infectious or inflammatory colitis. This may not be significantly changed compared to prior exam. Vascular/Lymphatic: Aortic atherosclerosis. No enlarged abdominal or pelvic lymph nodes. Reproductive: Prostate is unremarkable. Other: Small fat containing periumbilical hernia is noted. No abnormal fluid collection is noted. Musculoskeletal: No acute or significant osseous findings. IMPRESSION: Findings consistent with infectious or inflammatory right-sided colitis. Aortic Atherosclerosis (ICD10-I70.0). Electronically Signed   By: Marijo Conception M.D.   On: 03/04/2019 15:43   Ct Angio Abd/pel W/ And/or W/o  Result Date: 03/05/2019 CLINICAL DATA:  Concern for mesenteric ischemia EXAM: CTA ABDOMEN AND PELVIS wITHOUT AND WITH CONTRAST TECHNIQUE: Multidetector CT imaging of the abdomen and pelvis was performed using the standard protocol during bolus administration of intravenous contrast. Multiplanar reconstructed images and MIPs were obtained and reviewed to evaluate the vascular anatomy. CONTRAST:  180mL OMNIPAQUE  IOHEXOL 350 MG/ML SOLN COMPARISON:  03/04/2019 FINDINGS: VASCULAR Aorta: Nonaneurysmal and patent. Celiac: Patent.  Branch vessels are patent. SMA: Patent. There is smooth soft and calcified plaque at the origin without significant narrowing. Branch vessels are grossly patent. Renals: A single right renal artery is patent with atherosclerotic calcified plaque at the origin. There are 2 left renal arteries which are patent. IMA: Patent. Inflow: Bilateral common, internal, and external iliac arteries are patent with scattered calcified atherosclerotic plaque. Proximal Outflow: Grossly patent bilaterally with atherosclerotic plaque. No significant narrowing. Veins: No evidence of DVT. Review of the MIP images confirms the above findings. NON-VASCULAR Lower chest: Dependent atelectasis bilaterally. Hepatobiliary: Unremarkable appearance of the liver. Gallbladder is mildly distended without obvious wall thickening or gallstones. Pancreas: Unremarkable Spleen: Unremarkable Adrenals/Urinary Tract: Adrenal glands are within normal limits. Kidneys are within normal limits in appearance. Bladder is distended. Stomach/Bowel: There is persistent wall thickening involving the ascending colon and hepatic flexure. No evidence of wall thickening or mass in the transverse or descending colon. Sigmoid colon is unremarkable and decompressed. There is no evidence of pneumatosis. There is stranding in the fat adjacent to the ascending colon and hepatic flexure. No extraluminal bowel gas to suggest rupture. No evidence of abscess formation. Small bowel is decompressed.  Stomach is also decompressed. Lymphatic: No abnormal retroperitoneal adenopathy. Reproductive: Prostate is within normal limits. Other: No free-fluid. No free intraperitoneal gas. Postoperative changes from right inguinal herniorrhaphy are noted. Musculoskeletal: T10 and T11 mild wedge compression deformities have a chronic appearance. They are new compared with imaging  from 2014. IMPRESSION: VASCULAR No significant narrowing in the visceral vasculature is identified to cause mesenteric ischemia. Atherosclerotic changes are noted. NON-VASCULAR Persistent wall thickening and inflammatory change involving the ascending colon and hepatic flexure. No evidence of perforation or pneumatosis. Electronically Signed   By: Marybelle Killings M.D.   On: 03/05/2019 15:51   Scheduled Meds:  Chlorhexidine Gluconate Cloth  6 each Topical Daily   diltiazem  180 mg Oral Daily   metoprolol succinate  100 mg Oral Daily   pantoprazole  40 mg Oral BID AC   potassium chloride  40 mEq Oral Once   predniSONE  4 mg Oral Q breakfast   sodium chloride flush  3 mL Intravenous  Q12H   Continuous Infusions:  sodium chloride     piperacillin-tazobactam (ZOSYN)  IV 3.375 g (03/06/19 0531)    Active Problems:   Hypertension   Gout   Anemia   GI bleeding   AF (paroxysmal atrial fibrillation) (HCC)   Acute blood loss anemia   Acute GI bleeding  Critical CareTime spent: 31 minutes  Irwin Brakeman, MD [facility] 03/06/2019, 10:41 AM    LOS: 2 days  How to contact the [facility] Attending or Consulting provider [place] or covering provider during after hours [place], for this patient?  1. Check the care team in [facility] and look for a) attending/consulting [facility] provider listed and b) the [facility] team listed 2. Log into [email or web address] and use [place]'s universal password to access. If you do not have the password, please contact the hospital operator. 3. Locate the [facility] provider you are looking for under [facility] and page to a number that you can be directly reached. 4. If you still have difficulty reaching the provider, please page the [facility] (Director on Call) for the Hospitalists listed on amion for assistance.

## 2019-03-07 LAB — COMPREHENSIVE METABOLIC PANEL
ALT: 17 U/L (ref 0–44)
AST: 11 U/L — ABNORMAL LOW (ref 15–41)
Albumin: 2.7 g/dL — ABNORMAL LOW (ref 3.5–5.0)
Alkaline Phosphatase: 37 U/L — ABNORMAL LOW (ref 38–126)
Anion gap: 10 (ref 5–15)
BUN: 7 mg/dL — ABNORMAL LOW (ref 8–23)
CO2: 22 mmol/L (ref 22–32)
Calcium: 8.1 mg/dL — ABNORMAL LOW (ref 8.9–10.3)
Chloride: 106 mmol/L (ref 98–111)
Creatinine, Ser: 1.09 mg/dL (ref 0.61–1.24)
GFR calc Af Amer: >60 mL/min (ref >60)
GFR calc non Af Amer: >60 mL/min (ref >60)
Glucose, Bld: 99 mg/dL (ref 70–99)
Potassium: 3.5 mmol/L (ref 3.5–5.1)
Sodium: 138 mmol/L (ref 135–145)
Total Bilirubin: 0.2 mg/dL — ABNORMAL LOW (ref 0.3–1.2)
Total Protein: 5.5 g/dL — ABNORMAL LOW (ref 6.5–8.1)

## 2019-03-07 LAB — CBC
HCT: 21.7 % — ABNORMAL LOW (ref 39.0–52.0)
Hemoglobin: 7.1 g/dL — ABNORMAL LOW (ref 13.0–17.0)
MCH: 32.3 pg (ref 26.0–34.0)
MCHC: 32.7 g/dL (ref 30.0–36.0)
MCV: 98.6 fL (ref 80.0–100.0)
Platelets: 303 10*3/uL (ref 150–400)
RBC: 2.2 MIL/uL — ABNORMAL LOW (ref 4.22–5.81)
RDW: 14.2 % (ref 11.5–15.5)
WBC: 4.5 10*3/uL (ref 4.0–10.5)
nRBC: 0 % (ref 0.0–0.2)

## 2019-03-07 LAB — MAGNESIUM: Magnesium: 2 mg/dL (ref 1.7–2.4)

## 2019-03-07 MED ORDER — LORAZEPAM 2 MG/ML IJ SOLN
0.5000 mg | INTRAMUSCULAR | Status: AC | PRN
  Administered 2019-03-07: 09:00:00 0.5 mg via INTRAVENOUS
  Filled 2019-03-07: qty 1

## 2019-03-07 MED ORDER — AMOXICILLIN-POT CLAVULANATE 875-125 MG PO TABS
1.0000 | ORAL_TABLET | Freq: Two times a day (BID) | ORAL | Status: AC
  Administered 2019-03-08 (×2): 1 via ORAL
  Filled 2019-03-07 (×2): qty 1

## 2019-03-07 NOTE — Progress Notes (Signed)
PROGRESS NOTE  Miguel Spencer  ESP:[id number]  DOB: Sep 21, 1951  DOA: 03/03/2019 PCP: [name], MD  Brief Admission Hx: 68 y/o male on apixaban for PAF, HTN, presents with recurrent GI bleeding and abdominal pain with malaise.   MDM/Assessment & Plan:   1. Rectal bleeding / Colitis -  Pt reported a significant amount of rectal bleeding since restarting apixaban and taking indomethacin for gout.  He has been seen by GI and had a colonoscopy done 03/05/19 with findings concerning for ischemic colitis, however CTA abd/pelvis with no signs of vascular compromise.   He seems to be improving with antibiotics.   Continue protonix.  Awaiting biopsy results.   2. Acute on chronic blood loss anemia - transfuse as needed. Follow CBC.  Hg stable at 7.3. Holding apixaban.  3. Abdominal pain - LLQ abdominal pain seems to be improving with antibiotics.  4. PAF - He will be maintained on home toprol XL and cardizem CD.  Holding apixaban.  5. Gout - Colchicine ordered and symptoms much improved.  Discontinued indomethacin.  6. GAD - alprazolam ordered as needed. 7. Myositis / Arthritis - He has been on daily prednisone prescribed by his rheumatologist for last 3 months, it is being weaned down, now down to 6 mg daily.  Don't want to abruptly stop this, resumed at lower dose to continue weaning.  Pt to follow up with rheumatologist to continue to wean off prednisone.    DVT prophylaxis: SCDs Code Status: Full  Family Communication: updated uncle telephone Disposition Plan: transfer to med surg  Consultants:  GI  Procedures:    Antimicrobials:   zosyn 5/28 - 5/31  Subjective: Pt complains of poor appetite and bloating of stomach but abdominal pain improving.  No rectal bleeding.    Objective: Vitals:   03/07/19 0515 03/07/19 0809 03/07/19 0854 03/07/19 0855  BP: (!) 131/91 112/65 112/65 112/65  Pulse: 65 78    Resp: 17     Temp: 98.3 F (36.8 C)     TempSrc: Oral     SpO2: 98%       Weight:      Height:        Intake/Output Summary (Last 24 hours) at 03/07/2019 1206 Last data filed at 03/07/2019 0854 Gross per 24 hour  Intake 867.17 ml  Output 2 ml  Net 865.17 ml   Filed Weights   03/04/19 0500 03/05/19 0500 03/05/19 1232  Weight: 92.3 kg 92.9 kg 92.9 kg   REVIEW OF SYSTEMS  As per history otherwise all reviewed and reported negative  Exam:  General exam: awake, alert, NAD. Cooperative.   Respiratory system: Clear. No increased work of breathing. Cardiovascular system: S1 & S2 heard. No JVD, murmurs, gallops, clicks or pedal edema. Gastrointestinal system: Abdomen is soft and nontender. Normal bowel sounds heard. Central nervous system: Alert and oriented. No focal neurological deficits. Extremities: no CCE.  Data Reviewed: Basic Metabolic Panel: Recent Labs  Lab 03/03/19 1611 03/04/19 0449 03/05/19 0427 03/06/19 0433 03/07/19 0609  NA 135 138 136 135 138  K 4.0 3.7 3.5 3.3* 3.5  CL 104 109 107 107 106  CO2 23 22 22 22 22   GLUCOSE 118* 94 98 127* 99  BUN 20 11 9 9  7*  CREATININE 1.18 0.95 1.10 1.20 1.09  CALCIUM 8.3* 7.9* 8.0* 7.9* 8.1*  MG  --   --   --   --  2.0   Liver Function Tests: Recent Labs  Lab 03/03/19 1611  03/04/19 0449 03/05/19 0427 03/06/19 0433 03/07/19 0609  AST 16 12* 10* 11* 11*  ALT 24 18 16 14 17   ALKPHOS 50 39 40 35* 37*  BILITOT 0.7 0.5 0.6 <0.1* 0.2*  PROT 6.1* 5.1* 5.3* 4.9* 5.5*  ALBUMIN 3.2* 2.6* 2.7* 2.5* 2.7*   No results for input(s): LIPASE, AMYLASE in the last 168 hours. No results for input(s): AMMONIA in the last 168 hours. CBC: Recent Labs  Lab 03/03/19 1611  03/04/19 0449 03/05/19 0427 03/05/19 2349 03/06/19 0433 03/07/19 0609  WBC 8.7   < > 8.1 6.0 6.0 5.7 4.5  NEUTROABS 5.8  --   --   --   --   --   --   HGB 8.8*   < > 7.7* 7.4* 7.1* 7.3* 7.1*  HCT 26.5*   < > 23.4* 22.3* 22.3* 22.3* 21.7*  MCV 96.4   < > 98.3 97.8 100.0 98.7 98.6  PLT 295   < > 249 262 283 281 303   < > = values  in this interval not displayed.   Cardiac Enzymes: No results for input(s): CKTOTAL, CKMB, CKMBINDEX, TROPONINI in the last 168 hours. CBG (last 3)  No results for input(s): GLUCAP in the last 72 hours. Recent Results (from the past 240 hour(s))  SARS Coronavirus 2 (CEPHEID - Performed in [place] hospital lab), Hosp Order     Status: None   Collection Time: 02/25/19 12:15 PM  Result Value Ref Range Status   SARS Coronavirus 2 NEGATIVE NEGATIVE Final    Comment: (NOTE) If result is NEGATIVE SARS-CoV-2 target nucleic acids are NOT DETECTED. The SARS-CoV-2 RNA is generally detectable in upper and lower  respiratory specimens during the acute phase of infection. The lowest  concentration of SARS-CoV-2 viral copies this assay can detect is 250  copies / mL. A negative result does not preclude SARS-CoV-2 infection  and should not be used as the sole basis for treatment or other  patient management decisions.  A negative result may occur with  improper specimen collection / handling, submission of specimen other  than nasopharyngeal swab, presence of viral mutation(s) within the  areas targeted by this assay, and inadequate number of viral copies  (<250 copies / mL). A negative result must be combined with clinical  observations, patient history, and epidemiological information. If result is POSITIVE SARS-CoV-2 target nucleic acids are DETECTED. The SARS-CoV-2 RNA is generally detectable in upper and lower  respiratory specimens dur ing the acute phase of infection.  Positive  results are indicative of active infection with SARS-CoV-2.  Clinical  correlation with patient history and other diagnostic information is  necessary to determine patient infection status.  Positive results do  not rule out bacterial infection or co-infection with other viruses. If result is PRESUMPTIVE POSTIVE SARS-CoV-2 nucleic acids MAY BE PRESENT.   A presumptive positive result was obtained on the  submitted specimen  and confirmed on repeat testing.  While 2019 novel coronavirus  (SARS-CoV-2) nucleic acids may be present in the submitted sample  additional confirmatory testing may be necessary for epidemiological  and / or clinical management purposes  to differentiate between  SARS-CoV-2 and other Sarbecovirus currently known to infect humans.  If clinically indicated additional testing with an alternate test  methodology [phone]) is advised. The SARS-CoV-2 RNA is generally  detectable in upper and lower respiratory sp ecimens during the acute  phase of infection. The expected result is Negative. Fact Sheet for Patients:  StrictlyIdeas.no Fact  Sheet for Healthcare Providers: BankingDealers.co.za This test is not yet approved or cleared by the [place] and has been authorized for detection and/or diagnosis of SARS-CoV-2 by FDA under an Emergency Use Authorization (EUA).  This EUA will remain in effect (meaning this test can be used) for the duration of the COVID-19 declaration under Section 564(b)(1) of the Act, 21 U.S.C. section 360bbb-3(b)(1), unless the authorization is terminated or revoked sooner. Performed at [facility] [address] [place], [place] [address]   MRSA PCR Screening     Status: None   Collection Time: 02/25/19  3:52 PM  Result Value Ref Range Status   MRSA by PCR NEGATIVE NEGATIVE Final    Comment:        The GeneXpert MRSA Assay (FDA approved for NASAL specimens only), is one component of a comprehensive MRSA colonization surveillance program. It is not intended to diagnose MRSA infection nor to guide or monitor treatment for MRSA infections. Performed at [facility], [address], [place], [place] [address]   Gastrointestinal Panel by PCR , Stool     Status: None   Collection Time: 02/28/19  2:00 AM  Result Value Ref Range Status   Campylobacter species NOT DETECTED NOT DETECTED  Final   Plesimonas shigelloides NOT DETECTED NOT DETECTED Final   Salmonella species NOT DETECTED NOT DETECTED Final   Yersinia enterocolitica NOT DETECTED NOT DETECTED Final   Vibrio species NOT DETECTED NOT DETECTED Final   Vibrio cholerae NOT DETECTED NOT DETECTED Final   Enteroaggregative E coli (EAEC) NOT DETECTED NOT DETECTED Final   Enteropathogenic E coli (EPEC) NOT DETECTED NOT DETECTED Final   Enterotoxigenic E coli (ETEC) NOT DETECTED NOT DETECTED Final   Shiga like toxin producing E coli (STEC) NOT DETECTED NOT DETECTED Final   Shigella/Enteroinvasive E coli (EIEC) NOT DETECTED NOT DETECTED Final   Cryptosporidium NOT DETECTED NOT DETECTED Final   Cyclospora cayetanensis NOT DETECTED NOT DETECTED Final   Entamoeba histolytica NOT DETECTED NOT DETECTED Final   Giardia lamblia NOT DETECTED NOT DETECTED Final   Adenovirus F40/41 NOT DETECTED NOT DETECTED Final   Astrovirus NOT DETECTED NOT DETECTED Final   Norovirus GI/GII NOT DETECTED NOT DETECTED Final   Rotavirus A NOT DETECTED NOT DETECTED Final   Sapovirus (I, II, IV, and V) NOT DETECTED NOT DETECTED Final    Comment: Performed at [facility], [address] [place], [place] [address]  SARS Coronavirus 2 (CEPHEID - Performed in [place] hospital lab), Hosp Order     Status: None   Collection Time: 03/03/19  6:05 PM  Result Value Ref Range Status   SARS Coronavirus 2 NEGATIVE NEGATIVE Final    Comment: (NOTE) If result is NEGATIVE SARS-CoV-2 target nucleic acids are NOT DETECTED. The SARS-CoV-2 RNA is generally detectable in upper and lower  respiratory specimens during the acute phase of infection. The lowest  concentration of SARS-CoV-2 viral copies this assay can detect is 250  copies / mL. A negative result does not preclude SARS-CoV-2 infection  and should not be used as the sole basis for treatment or other  patient management decisions.  A negative result may occur with  improper specimen  collection / handling, submission of specimen other  than nasopharyngeal swab, presence of viral mutation(s) within the  areas targeted by this assay, and inadequate number of viral copies  (<250 copies / mL). A negative result must be combined with clinical  observations, patient history, and epidemiological information. If result  is POSITIVE SARS-CoV-2 target nucleic acids are DETECTED. The SARS-CoV-2 RNA is generally detectable in upper and lower  respiratory specimens dur ing the acute phase of infection.  Positive  results are indicative of active infection with SARS-CoV-2.  Clinical  correlation with patient history and other diagnostic information is  necessary to determine patient infection status.  Positive results do  not rule out bacterial infection or co-infection with other viruses. If result is PRESUMPTIVE POSTIVE SARS-CoV-2 nucleic acids MAY BE PRESENT.   A presumptive positive result was obtained on the submitted specimen  and confirmed on repeat testing.  While 2019 novel coronavirus  (SARS-CoV-2) nucleic acids may be present in the submitted sample  additional confirmatory testing may be necessary for epidemiological  and / or clinical management purposes  to differentiate between  SARS-CoV-2 and other Sarbecovirus currently known to infect humans.  If clinically indicated additional testing with an alternate test  methodology [phone]) is advised. The SARS-CoV-2 RNA is generally  detectable in upper and lower respiratory sp ecimens during the acute  phase of infection. The expected result is Negative. Fact Sheet for Patients:  StrictlyIdeas.no Fact Sheet for Healthcare Providers: BankingDealers.co.za This test is not yet approved or cleared by the [place] FDA and has been authorized for detection and/or diagnosis of SARS-CoV-2 by FDA under an Emergency Use Authorization (EUA).  This EUA will remain in effect  (meaning this test can be used) for the duration of the COVID-19 declaration under Section 564(b)(1) of the Act, 21 U.S.C. section 360bbb-3(b)(1), unless the authorization is terminated or revoked sooner. Performed at [facility], [address], [place], [place] [address]   Gastrointestinal Panel by PCR , Stool     Status: None   Collection Time: 03/03/19  9:39 PM  Result Value Ref Range Status   Campylobacter species NOT DETECTED NOT DETECTED Final   Plesimonas shigelloides NOT DETECTED NOT DETECTED Final   Salmonella species NOT DETECTED NOT DETECTED Final   Yersinia enterocolitica NOT DETECTED NOT DETECTED Final   Vibrio species NOT DETECTED NOT DETECTED Final   Vibrio cholerae NOT DETECTED NOT DETECTED Final   Enteroaggregative E coli (EAEC) NOT DETECTED NOT DETECTED Final   Enteropathogenic E coli (EPEC) NOT DETECTED NOT DETECTED Final   Enterotoxigenic E coli (ETEC) NOT DETECTED NOT DETECTED Final   Shiga like toxin producing E coli (STEC) NOT DETECTED NOT DETECTED Final   Shigella/Enteroinvasive E coli (EIEC) NOT DETECTED NOT DETECTED Final   Cryptosporidium NOT DETECTED NOT DETECTED Final   Cyclospora cayetanensis NOT DETECTED NOT DETECTED Final   Entamoeba histolytica NOT DETECTED NOT DETECTED Final   Giardia lamblia NOT DETECTED NOT DETECTED Final   Adenovirus F40/41 NOT DETECTED NOT DETECTED Final   Astrovirus NOT DETECTED NOT DETECTED Final   Norovirus GI/GII NOT DETECTED NOT DETECTED Final   Rotavirus A NOT DETECTED NOT DETECTED Final   Sapovirus (I, II, IV, and V) NOT DETECTED NOT DETECTED Final    Comment: Performed at [facility], [address] [place], [place] [address]  C difficile quick scan w PCR reflex     Status: None   Collection Time: 03/03/19  9:39 PM  Result Value Ref Range Status   C Diff antigen NEGATIVE NEGATIVE Final   C Diff toxin NEGATIVE NEGATIVE Final   C Diff interpretation No C. difficile detected.  Final    Comment: Performed at  [facility], [address] [place], [place] [address]     Studies: Ct Angio Abd/pel  W/ And/or W/o  Result Date: 03/05/2019 CLINICAL DATA:  Concern for mesenteric ischemia EXAM: CTA ABDOMEN AND PELVIS wITHOUT AND WITH CONTRAST TECHNIQUE: Multidetector CT imaging of the abdomen and pelvis was performed using the standard protocol during bolus administration of intravenous contrast. Multiplanar reconstructed images and MIPs were obtained and reviewed to evaluate the vascular anatomy. CONTRAST:  117mL OMNIPAQUE IOHEXOL 350 MG/ML SOLN COMPARISON:  03/04/2019 FINDINGS: VASCULAR Aorta: Nonaneurysmal and patent. Celiac: Patent.  Branch vessels are patent. SMA: Patent. There is smooth soft and calcified plaque at the origin without significant narrowing. Branch vessels are grossly patent. Renals: A single right renal artery is patent with atherosclerotic calcified plaque at the origin. There are 2 left renal arteries which are patent. IMA: Patent. Inflow: Bilateral common, internal, and external iliac arteries are patent with scattered calcified atherosclerotic plaque. Proximal Outflow: Grossly patent bilaterally with atherosclerotic plaque. No significant narrowing. Veins: No evidence of DVT. Review of the MIP images confirms the above findings. NON-VASCULAR Lower chest: Dependent atelectasis bilaterally. Hepatobiliary: Unremarkable appearance of the liver. Gallbladder is mildly distended without obvious wall thickening or gallstones. Pancreas: Unremarkable Spleen: Unremarkable Adrenals/Urinary Tract: Adrenal glands are within normal limits. Kidneys are within normal limits in appearance. Bladder is distended. Stomach/Bowel: There is persistent wall thickening involving the ascending colon and hepatic flexure. No evidence of wall thickening or mass in the transverse or descending colon. Sigmoid colon is unremarkable and decompressed. There is no evidence of pneumatosis. There is stranding in the fat adjacent to the  ascending colon and hepatic flexure. No extraluminal bowel gas to suggest rupture. No evidence of abscess formation. Small bowel is decompressed.  Stomach is also decompressed. Lymphatic: No abnormal retroperitoneal adenopathy. Reproductive: Prostate is within normal limits. Other: No free-fluid. No free intraperitoneal gas. Postoperative changes from right inguinal herniorrhaphy are noted. Musculoskeletal: T10 and T11 mild wedge compression deformities have a chronic appearance. They are new compared with imaging from 2014. IMPRESSION: VASCULAR No significant narrowing in the visceral vasculature is identified to cause mesenteric ischemia. Atherosclerotic changes are noted. NON-VASCULAR Persistent wall thickening and inflammatory change involving the ascending colon and hepatic flexure. No evidence of perforation or pneumatosis. Electronically Signed   By: Marybelle Killings M.D.   On: 03/05/2019 15:51   Scheduled Meds:  Chlorhexidine Gluconate Cloth  6 each Topical Daily   colchicine  0.6 mg Oral Daily   diltiazem  180 mg Oral Daily   metoprolol succinate  100 mg Oral Daily   pantoprazole  40 mg Oral BID AC   predniSONE  4 mg Oral Q breakfast   sodium chloride flush  3 mL Intravenous Q12H   Continuous Infusions:  sodium chloride     sodium chloride 250 mL (03/06/19 1515)   piperacillin-tazobactam (ZOSYN)  IV 3.375 g (03/07/19 3267)    Active Problems:   Hypertension   Gout   Anemia   GI bleeding   AF (paroxysmal atrial fibrillation) ([place])   Acute blood loss anemia   Acute GI bleeding   Irwin Brakeman, MD [facility] 03/07/2019, 12:06 PM    LOS: 3 days  How to contact the [facility] Attending or Consulting provider [place] or covering provider during after hours [place], for this patient?  1. Check the care team in [facility] and look for a) attending/consulting [facility] provider listed and b) the [facility] team listed 2. Log into [email or web address] and use [place]'s universal password to access.  If you do not have the password, please contact the hospital  operator. 3. Locate the [facility] provider you are looking for under [facility] and page to a number that you can be directly reached. 4. If you still have difficulty reaching the provider, please page the [facility] (Director on Call) for the Hospitalists listed on amion for assistance.

## 2019-03-07 NOTE — Progress Notes (Signed)
Subjective: Feels improved today. Ativan has helped with anxiety. Received dose this morning, which has helped him rest. Tolerated soft diet yesterday. Today, ate eggs, sausage, bacon for breakfast. Feels the grease upset his stomach. Notes 7 loose stools yesterday with two loose stools this morning. Second episode this morning with better consistency. No overt GI bleeding.   Objective: Vital signs in last 24 hours: Temp:  [98.3 F (36.8 C)-98.5 F (36.9 C)] 98.3 F (36.8 C) (05/31 0515) Pulse Rate:  [64-78] 78 (05/31 0809) Resp:  [17] 17 (05/31 0515) BP: (90-131)/(65-91) 112/65 (05/31 0855) SpO2:  [95 %-100 %] 98 % (05/31 0515) Last BM Date: 03/07/19 General:   Alert and oriented, pleasant Head:  Normocephalic and atraumatic.  Abdomen:  Bowel sounds present, soft, non-tender, non-distended. Small umbilical hernia Neurologic:  Alert and  oriented x4 Psych:  Normal mood and affect.  Intake/Output from previous day: 05/30 0701 - 05/31 0700 In: 637.2 [P.O.:480; I.V.:10.6; IV Piggyback:146.6] Out: 2 [Urine:2] Intake/Output this shift: Total I/O In: 240 [P.O.:240] Out: -   Lab Results: Recent Labs    03/05/19 2349 03/06/19 0433 03/07/19 0609  WBC 6.0 5.7 4.5  HGB 7.1* 7.3* 7.1*  HCT 22.3* 22.3* 21.7*  PLT 283 281 303   BMET Recent Labs    03/05/19 0427 03/06/19 0433 03/07/19 0609  NA 136 135 138  K 3.5 3.3* 3.5  CL 107 107 106  CO2 22 22 22   GLUCOSE 98 127* 99  BUN 9 9 7*  CREATININE 1.10 1.20 1.09  CALCIUM 8.0* 7.9* 8.1*   LFT Recent Labs    03/05/19 0427 03/06/19 0433 03/07/19 0609  PROT 5.3* 4.9* 5.5*  ALBUMIN 2.7* 2.5* 2.7*  AST 10* 11* 11*  ALT 16 14 17   ALKPHOS 40 35* 37*  BILITOT 0.6 <0.1* 0.2*    Studies/Results: Ct Angio Abd/pel W/ And/or W/o  Result Date: 03/05/2019 CLINICAL DATA:  Concern for mesenteric ischemia EXAM: CTA ABDOMEN AND PELVIS wITHOUT AND WITH CONTRAST TECHNIQUE: Multidetector CT imaging of the abdomen and pelvis was  performed using the standard protocol during bolus administration of intravenous contrast. Multiplanar reconstructed images and MIPs were obtained and reviewed to evaluate the vascular anatomy. CONTRAST:  117mL OMNIPAQUE IOHEXOL 350 MG/ML SOLN COMPARISON:  03/04/2019 FINDINGS: VASCULAR Aorta: Nonaneurysmal and patent. Celiac: Patent.  Branch vessels are patent. SMA: Patent. There is smooth soft and calcified plaque at the origin without significant narrowing. Branch vessels are grossly patent. Renals: A single right renal artery is patent with atherosclerotic calcified plaque at the origin. There are 2 left renal arteries which are patent. IMA: Patent. Inflow: Bilateral common, internal, and external iliac arteries are patent with scattered calcified atherosclerotic plaque. Proximal Outflow: Grossly patent bilaterally with atherosclerotic plaque. No significant narrowing. Veins: No evidence of DVT. Review of the MIP images confirms the above findings. NON-VASCULAR Lower chest: Dependent atelectasis bilaterally. Hepatobiliary: Unremarkable appearance of the liver. Gallbladder is mildly distended without obvious wall thickening or gallstones. Pancreas: Unremarkable Spleen: Unremarkable Adrenals/Urinary Tract: Adrenal glands are within normal limits. Kidneys are within normal limits in appearance. Bladder is distended. Stomach/Bowel: There is persistent wall thickening involving the ascending colon and hepatic flexure. No evidence of wall thickening or mass in the transverse or descending colon. Sigmoid colon is unremarkable and decompressed. There is no evidence of pneumatosis. There is stranding in the fat adjacent to the ascending colon and hepatic flexure. No extraluminal bowel gas to suggest rupture. No evidence of abscess formation. Small bowel is  decompressed.  Stomach is also decompressed. Lymphatic: No abnormal retroperitoneal adenopathy. Reproductive: Prostate is within normal limits. Other: No free-fluid. No  free intraperitoneal gas. Postoperative changes from right inguinal herniorrhaphy are noted. Musculoskeletal: T10 and T11 mild wedge compression deformities have a chronic appearance. They are new compared with imaging from 2014. IMPRESSION: VASCULAR No significant narrowing in the visceral vasculature is identified to cause mesenteric ischemia. Atherosclerotic changes are noted. NON-VASCULAR Persistent wall thickening and inflammatory change involving the ascending colon and hepatic flexure. No evidence of perforation or pneumatosis. Electronically Signed   By: Marybelle Killings M.D.   On: 03/05/2019 15:51    Assessment: 68 year old male admitted with GI bleed on Eliquis, s/p colonoscopy with extensive ascending colon ulceration concerning for ischemia. CTA with atherosclerotic changes but no significant stenosis. Diarrhea on admission with negative Cdiff and GI pathogen panel. Clinically, he is feeling better today and tolerated soft diet yesterday, although slight unsettled feeling after eating bacon and sausage for breakfast. He does note 7 loose stools yesterday but only 2 thus far today. Multifactorial in setting of colitis, antibiotics, colchicine. Overall, he feels improved from admission and remains without further overt bleeding. Hgb is remaining in 7 range.    Plan: Continue low residue diet Follow H/H Monitor stool output Follow-up on pathology as comes available Continue to hold Eliquis for now Will reassess tomorrow   Annitta Needs, PhD, ANP-BC [facility]    LOS: 3 days    03/07/2019, 11:19 AM

## 2019-03-07 NOTE — Progress Notes (Signed)
Patient c/o anxiety and feeling like his heart was fluttering. VSS, EKG showing Normal Sinus. Ativan 0.5mg  given IV for Anxiety.

## 2019-03-07 NOTE — Progress Notes (Signed)
Patient has complained of anxiety in morning, received IV Ativan 0.5 mg. Ate 90% of breakfast. He continues to have frequent  loose stool. MD aware.

## 2019-03-08 ENCOUNTER — Encounter (HOSPITAL_COMMUNITY): Payer: Self-pay | Admitting: Internal Medicine

## 2019-03-08 DIAGNOSIS — K529 Noninfective gastroenteritis and colitis, unspecified: Secondary | ICD-10-CM

## 2019-03-08 DIAGNOSIS — D649 Anemia, unspecified: Secondary | ICD-10-CM

## 2019-03-08 DIAGNOSIS — M109 Gout, unspecified: Secondary | ICD-10-CM

## 2019-03-08 LAB — CBC
HCT: 24.2 % — ABNORMAL LOW (ref 39.0–52.0)
Hemoglobin: 7.8 g/dL — ABNORMAL LOW (ref 13.0–17.0)
MCH: 31.5 pg (ref 26.0–34.0)
MCHC: 32.2 g/dL (ref 30.0–36.0)
MCV: 97.6 fL (ref 80.0–100.0)
Platelets: 360 10*3/uL (ref 150–400)
RBC: 2.48 MIL/uL — ABNORMAL LOW (ref 4.22–5.81)
RDW: 14.2 % (ref 11.5–15.5)
WBC: 6.9 10*3/uL (ref 4.0–10.5)
nRBC: 0 % (ref 0.0–0.2)

## 2019-03-08 LAB — COMPREHENSIVE METABOLIC PANEL
ALT: 29 U/L (ref 0–44)
AST: 24 U/L (ref 15–41)
Albumin: 2.9 g/dL — ABNORMAL LOW (ref 3.5–5.0)
Alkaline Phosphatase: 41 U/L (ref 38–126)
Anion gap: 10 (ref 5–15)
BUN: 6 mg/dL — ABNORMAL LOW (ref 8–23)
CO2: 24 mmol/L (ref 22–32)
Calcium: 8.5 mg/dL — ABNORMAL LOW (ref 8.9–10.3)
Chloride: 103 mmol/L (ref 98–111)
Creatinine, Ser: 1.08 mg/dL (ref 0.61–1.24)
GFR calc Af Amer: >60 mL/min (ref >60)
GFR calc non Af Amer: >60 mL/min (ref >60)
Glucose, Bld: 96 mg/dL (ref 70–99)
Potassium: 3.8 mmol/L (ref 3.5–5.1)
Sodium: 137 mmol/L (ref 135–145)
Total Bilirubin: 0.2 mg/dL — ABNORMAL LOW (ref 0.3–1.2)
Total Protein: 5.8 g/dL — ABNORMAL LOW (ref 6.5–8.1)

## 2019-03-08 MED ORDER — SACCHAROMYCES BOULARDII 250 MG PO CAPS
250.0000 mg | ORAL_CAPSULE | Freq: Two times a day (BID) | ORAL | Status: DC
  Administered 2019-03-08 – 2019-03-10 (×5): 250 mg via ORAL
  Filled 2019-03-08 (×5): qty 1

## 2019-03-08 MED ORDER — DICYCLOMINE HCL 10 MG PO CAPS: 10.0000 mg | ORAL_CAPSULE | Freq: Four times a day (QID) | ORAL | Status: DC | PRN

## 2019-03-08 NOTE — Progress Notes (Signed)
Subjective: Overall feels better. Had 5 loose stools yesterday, 4 already today (has been up since 4:00 am). Has some mild abdominal discomfort with bowel movement that imnproves afterward. Denies hematochezia or melena. No N/V. No other GI complaints. Is worried about being sent home 'before there's an answer" to his symptoms.  Objective: Vital signs in last 24 hours: Temp:  [98.1 F (36.7 C)-98.3 F (36.8 C)] 98.3 F (36.8 C) (06/01 0623) Pulse Rate:  [60-80] 67 (06/01 0623) Resp:  [18-20] 18 (06/01 0623) BP: (108-116)/(65-83) 108/82 (06/01 0623) SpO2:  [95 %-100 %] 97 % (06/01 0623) Weight:  [85.3 kg] 85.3 kg (06/01 0623) Last BM Date: 03/07/19 General:   Alert and oriented, pleasant Head:  Normocephalic and atraumatic. Eyes:  No icterus, sclera clear. Conjuctiva pink.  Neck:  Supple, without thyromegaly or masses.  Heart:  S1, S2 present, no murmurs noted.  Lungs: Clear to auscultation bilaterally, without wheezing, rales, or rhonchi.  Abdomen:  Bowel sounds present, soft, non-tender, non-distended. No HSM or hernias noted. No rebound or guarding. No masses appreciated  Msk:  Symmetrical without gross deformities. Extremities:  Without clubbing or edema. Neurologic:  Alert and  oriented x4;  grossly normal neurologically. Psych:  Alert and cooperative. Normal mood and affect.  Intake/Output from previous day: 05/31 0701 - 06/01 0700 In: 1031.2 [P.O.:960; I.V.:8.9; IV Piggyback:62.2] Out: 2 [Urine:2] Intake/Output this shift: No intake/output data recorded.  Lab Results: Recent Labs    03/06/19 0433 03/07/19 0609 03/08/19 0546  WBC 5.7 4.5 6.9  HGB 7.3* 7.1* 7.8*  HCT 22.3* 21.7* 24.2*  PLT 281 303 360   BMET Recent Labs    03/06/19 0433 03/07/19 0609 03/08/19 0546  NA 135 138 137  K 3.3* 3.5 3.8  CL 107 106 103  CO2 22 22 24   GLUCOSE 127* 99 96  BUN 9 7* 6*  CREATININE 1.20 1.09 1.08  CALCIUM 7.9* 8.1* 8.5*   LFT Recent Labs    03/06/19 0433  03/07/19 0609 03/08/19 0546  PROT 4.9* 5.5* 5.8*  ALBUMIN 2.5* 2.7* 2.9*  AST 11* 11* 24  ALT 14 17 29   ALKPHOS 35* 37* 41  BILITOT <0.1* 0.2* 0.2*   PT/INR No results for input(s): LABPROT, INR in the last 72 hours. Hepatitis Panel No results for input(s): HEPBSAG, HCVAB, HEPAIGM, HEPBIGM in the last 72 hours.   Studies/Results: No results found.  Assessment: 68 year old male admitted with GI bleed on Eliquis, s/p colonoscopy with extensive ascending colon ulceration concerning for ischemia. CTA with atherosclerotic changes but no significant stenosis. Diarrhea on admission with negative Cdiff and GI pathogen panel. Clinically, he was feeling better yesterday and tolerated soft diet although slight unsettled feeling after eating bacon and sausage for breakfast. Loose stools not improving with 7 on Saturday, 5 yesterday, and 4 already today. Diarrhea associated with mild abdominal discomfort; Multifactorial in setting of colitis, antibiotics, colchicine. Overall, he feels improved from admission and remains without further overt bleeding.  Today hgb increased to 7.8 from 7.1/7.3 over last few days. Biopsies still pending from colonoscopy. Confirmed CDiff and GI path negative. Persistent diarrhea.  Plan: 1. Start bentyl for abdominal pain and diarrhea 2. May need to add Imodium pending response to Bentyl 3. Follow for pathology 4. Monitor stools when Bentyl starts 5. Supportive measures   Thank you for allowing [place] to participate in the care of [facility], DNP, AGNP-C Adult & Gerontological Nurse Practitioner [facility]  LOS: 4 days    03/08/2019, 7:59 AM

## 2019-03-08 NOTE — Care Management Important Message (Signed)
Important Message  Patient Details  Name: Miguel Spencer MRN: [id number] Date of Birth: Jan 29, 1951   Medicare Important Message Given:  Yes    Tommy Medal 03/08/2019, 3:25 PM

## 2019-03-08 NOTE — Progress Notes (Signed)
PROGRESS NOTE  Miguel Spencer  DXI:[id number]  DOB: March 20, 1951  DOA: 03/03/2019 PCP: [name], MD  Brief Admission Hx: 68 y/o male on apixaban for PAF, HTN, presents with recurrent GI bleeding and abdominal pain with malaise.   MDM/Assessment & Plan:   1. Rectal bleeding / Colitis -  Pt reported a significant amount of rectal bleeding since restarting apixaban and taking indomethacin for gout.  He has been seen by GI and had a colonoscopy done 03/05/19 with findings concerning for ischemic colitis, however CTA abd/pelvis with no signs of vascular compromise.   He seems to be improving with antibiotics which have been de-escalated.  Started probiotics 6/1.  Continue protonix.  Awaiting biopsy results.   2. Acute on chronic blood loss anemia - transfuse as needed. Follow CBC.  Hg stable at 7.3. Holding apixaban for now.  3. Abdominal pain - LLQ abdominal pain seems to be improving with antibiotics.  4. PAF - He will be maintained on home toprol XL and cardizem CD.  Holding apixaban temporarily.  5. Gout - Colchicine ordered and symptoms much improved.  Discontinued indomethacin.  May need to hold if loose stool persists.  6. GAD - alprazolam ordered as needed. 7. Myositis / Arthritis - He has been on daily prednisone prescribed by his rheumatologist for last 3 months, it is being weaned down, now down to 6 mg daily.  Don't want to abruptly stop this, resumed at lower dose of 4 mg daily to continue weaning.  Pt to follow up with rheumatologist to continue to wean off prednisone.    DVT prophylaxis: SCDs Code Status: Full  Family Communication: updated uncle telephone Disposition Plan: home in 1-2 days  Consultants:  GI  Procedures:    Antimicrobials:   zosyn 5/28 - 5/31  Subjective: Pt reports abdominal pain improving.  He is having loose stools.  No rectal bleeding.  He can only tolerate soft diet.   Objective: Vitals:   03/07/19 1340 03/07/19 1958 03/07/19 2128 03/08/19  0623  BP: 112/83  116/83 108/82  Pulse: 80  60 67  Resp: 18  20 18   Temp:   98.1 F (36.7 C) 98.3 F (36.8 C)  TempSrc:   Oral Oral  SpO2: 99% 95% 100% 97%  Weight:    85.3 kg  Height:        Intake/Output Summary (Last 24 hours) at 03/08/2019 1350 Last data filed at 03/08/2019 0840 Gross per 24 hour  Intake 551.15 ml  Output -  Net 551.15 ml   Filed Weights   03/05/19 0500 03/05/19 1232 03/08/19 0623  Weight: 92.9 kg 92.9 kg 85.3 kg   REVIEW OF SYSTEMS  As per history otherwise all reviewed and reported negative  Exam:  General exam: awake, alert, NAD. Cooperative.   Respiratory system: Clear. No increased work of breathing. Cardiovascular system: S1 & S2 heard. No JVD, murmurs, gallops, clicks or pedal edema. Gastrointestinal system: Abdomen is soft and nontender. Normal bowel sounds heard. Central nervous system: Alert and oriented. No focal neurological deficits. Extremities: no CCE.  Data Reviewed: Basic Metabolic Panel: Recent Labs  Lab 03/04/19 0449 03/05/19 0427 03/06/19 0433 03/07/19 0609 03/08/19 0546  NA 138 136 135 138 137  K 3.7 3.5 3.3* 3.5 3.8  CL 109 107 107 106 103  CO2 22 22 22 22 24   GLUCOSE 94 98 127* 99 96  BUN 11 9 9  7* 6*  CREATININE 0.95 1.10 1.20 1.09 1.08  CALCIUM 7.9* 8.0* 7.9*  8.1* 8.5*  MG  --   --   --  2.0  --    Liver Function Tests: Recent Labs  Lab 03/04/19 0449 03/05/19 0427 03/06/19 0433 03/07/19 0609 03/08/19 0546  AST 12* 10* 11* 11* 24  ALT 18 16 14 17 29   ALKPHOS 39 40 35* 37* 41  BILITOT 0.5 0.6 <0.1* 0.2* 0.2*  PROT 5.1* 5.3* 4.9* 5.5* 5.8*  ALBUMIN 2.6* 2.7* 2.5* 2.7* 2.9*   No results for input(s): LIPASE, AMYLASE in the last 168 hours. No results for input(s): AMMONIA in the last 168 hours. CBC: Recent Labs  Lab 03/03/19 1611  03/05/19 0427 03/05/19 2349 03/06/19 0433 03/07/19 0609 03/08/19 0546  WBC 8.7   < > 6.0 6.0 5.7 4.5 6.9  NEUTROABS 5.8  --   --   --   --   --   --   HGB 8.8*   < >  7.4* 7.1* 7.3* 7.1* 7.8*  HCT 26.5*   < > 22.3* 22.3* 22.3* 21.7* 24.2*  MCV 96.4   < > 97.8 100.0 98.7 98.6 97.6  PLT 295   < > 262 283 281 303 360   < > = values in this interval not displayed.   Cardiac Enzymes: No results for input(s): CKTOTAL, CKMB, CKMBINDEX, TROPONINI in the last 168 hours. CBG (last 3)  No results for input(s): GLUCAP in the last 72 hours. Recent Results (from the past 240 hour(s))  Gastrointestinal Panel by PCR , Stool     Status: None   Collection Time: 02/28/19  2:00 AM  Result Value Ref Range Status   Campylobacter species NOT DETECTED NOT DETECTED Final   Plesimonas shigelloides NOT DETECTED NOT DETECTED Final   Salmonella species NOT DETECTED NOT DETECTED Final   Yersinia enterocolitica NOT DETECTED NOT DETECTED Final   Vibrio species NOT DETECTED NOT DETECTED Final   Vibrio cholerae NOT DETECTED NOT DETECTED Final   Enteroaggregative E coli (EAEC) NOT DETECTED NOT DETECTED Final   Enteropathogenic E coli (EPEC) NOT DETECTED NOT DETECTED Final   Enterotoxigenic E coli (ETEC) NOT DETECTED NOT DETECTED Final   Shiga like toxin producing E coli (STEC) NOT DETECTED NOT DETECTED Final   Shigella/Enteroinvasive E coli (EIEC) NOT DETECTED NOT DETECTED Final   Cryptosporidium NOT DETECTED NOT DETECTED Final   Cyclospora cayetanensis NOT DETECTED NOT DETECTED Final   Entamoeba histolytica NOT DETECTED NOT DETECTED Final   Giardia lamblia NOT DETECTED NOT DETECTED Final   Adenovirus F40/41 NOT DETECTED NOT DETECTED Final   Astrovirus NOT DETECTED NOT DETECTED Final   Norovirus GI/GII NOT DETECTED NOT DETECTED Final   Rotavirus A NOT DETECTED NOT DETECTED Final   Sapovirus (I, II, IV, and V) NOT DETECTED NOT DETECTED Final    Comment: Performed at [facility], [address] [place], [place] [address]  SARS Coronavirus 2 (CEPHEID - Performed in [facility] hospital lab), Hosp Order     Status: None   Collection Time: 03/03/19  6:05 PM  Result Value  Ref Range Status   SARS Coronavirus 2 NEGATIVE NEGATIVE Final    Comment: (NOTE) If result is NEGATIVE SARS-CoV-2 target nucleic acids are NOT DETECTED. The SARS-CoV-2 RNA is generally detectable in upper and lower  respiratory specimens during the acute phase of infection. The lowest  concentration of SARS-CoV-2 viral copies this assay can detect is 250  copies / mL. A negative result does not preclude SARS-CoV-2 infection  and should not be used as the  sole basis for treatment or other  patient management decisions.  A negative result may occur with  improper specimen collection / handling, submission of specimen other  than nasopharyngeal swab, presence of viral mutation(s) within the  areas targeted by this assay, and inadequate number of viral copies  (<250 copies / mL). A negative result must be combined with clinical  observations, patient history, and epidemiological information. If result is POSITIVE SARS-CoV-2 target nucleic acids are DETECTED. The SARS-CoV-2 RNA is generally detectable in upper and lower  respiratory specimens dur ing the acute phase of infection.  Positive  results are indicative of active infection with SARS-CoV-2.  Clinical  correlation with patient history and other diagnostic information is  necessary to determine patient infection status.  Positive results do  not rule out bacterial infection or co-infection with other viruses. If result is PRESUMPTIVE POSTIVE SARS-CoV-2 nucleic acids MAY BE PRESENT.   A presumptive positive result was obtained on the submitted specimen  and confirmed on repeat testing.  While 2019 novel coronavirus  (SARS-CoV-2) nucleic acids may be present in the submitted sample  additional confirmatory testing may be necessary for epidemiological  and / or clinical management purposes  to differentiate between  SARS-CoV-2 and other Sarbecovirus currently known to infect humans.  If clinically indicated additional testing with an  alternate test  methodology [phone]) is advised. The SARS-CoV-2 RNA is generally  detectable in upper and lower respiratory sp ecimens during the acute  phase of infection. The expected result is Negative. Fact Sheet for Patients:  StrictlyIdeas.no Fact Sheet for Healthcare Providers: BankingDealers.co.za This test is not yet approved or cleared by the [place] FDA and has been authorized for detection and/or diagnosis of SARS-CoV-2 by FDA under an Emergency Use Authorization (EUA).  This EUA will remain in effect (meaning this test can be used) for the duration of the COVID-19 declaration under Section 564(b)(1) of the Act, 21 U.S.C. section 360bbb-3(b)(1), unless the authorization is terminated or revoked sooner. Performed at [facility], [address], [place], [place] [address]   Gastrointestinal Panel by PCR , Stool     Status: None   Collection Time: 03/03/19  9:39 PM  Result Value Ref Range Status   Campylobacter species NOT DETECTED NOT DETECTED Final   Plesimonas shigelloides NOT DETECTED NOT DETECTED Final   Salmonella species NOT DETECTED NOT DETECTED Final   Yersinia enterocolitica NOT DETECTED NOT DETECTED Final   Vibrio species NOT DETECTED NOT DETECTED Final   Vibrio cholerae NOT DETECTED NOT DETECTED Final   Enteroaggregative E coli (EAEC) NOT DETECTED NOT DETECTED Final   Enteropathogenic E coli (EPEC) NOT DETECTED NOT DETECTED Final   Enterotoxigenic E coli (ETEC) NOT DETECTED NOT DETECTED Final   Shiga like toxin producing E coli (STEC) NOT DETECTED NOT DETECTED Final   Shigella/Enteroinvasive E coli (EIEC) NOT DETECTED NOT DETECTED Final   Cryptosporidium NOT DETECTED NOT DETECTED Final   Cyclospora cayetanensis NOT DETECTED NOT DETECTED Final   Entamoeba histolytica NOT DETECTED NOT DETECTED Final   Giardia lamblia NOT DETECTED NOT DETECTED Final   Adenovirus F40/41 NOT DETECTED NOT DETECTED Final    Astrovirus NOT DETECTED NOT DETECTED Final   Norovirus GI/GII NOT DETECTED NOT DETECTED Final   Rotavirus A NOT DETECTED NOT DETECTED Final   Sapovirus (I, II, IV, and V) NOT DETECTED NOT DETECTED Final    Comment: Performed at [facility], [address] [place], [place] [address]  C difficile quick scan w PCR  reflex     Status: None   Collection Time: 03/03/19  9:39 PM  Result Value Ref Range Status   C Diff antigen NEGATIVE NEGATIVE Final   C Diff toxin NEGATIVE NEGATIVE Final   C Diff interpretation No C. difficile detected.  Final    Comment: Performed at [facility], [address], [place], [place] [address]     Studies: No results found. Scheduled Meds: . amoxicillin-clavulanate  1 tablet Oral Q12H  . Chlorhexidine Gluconate Cloth  6 each Topical Daily  . colchicine  0.6 mg Oral Daily  . diltiazem  180 mg Oral Daily  . metoprolol succinate  100 mg Oral Daily  . pantoprazole  40 mg Oral BID AC  . predniSONE  4 mg Oral Q breakfast  . saccharomyces boulardii  250 mg Oral BID  . sodium chloride flush  3 mL Intravenous Q12H   Continuous Infusions: . sodium chloride    . sodium chloride 250 mL (03/06/19 1515)    Active Problems:   Hypertension   Gout   Anemia   GI bleeding   AF (paroxysmal atrial fibrillation) (HCC)   Acute blood loss anemia   Acute GI bleeding   Noninfectious gastroenteritis  Irwin Brakeman, MD [facility] 03/08/2019, 1:50 PM    LOS: 4 days  How to contact the [facility] Attending or Consulting provider [place] or covering provider during after hours [place], for this patient?  1. Check the care team in [facility] and look for a) attending/consulting [facility] provider listed and b) the [facility] team listed 2. Log into [email or web address] and use 's universal password to access. If you do not have the password, please contact the hospital operator. 3. Locate the [facility] provider you are looking for under [facility] and page to a number that you  can be directly reached. 4. If you still have difficulty reaching the provider, please page the [facility] (Director on Call) for the Hospitalists listed on amion for assistance.

## 2019-03-08 NOTE — Evaluation (Signed)
Physical Therapy Evaluation Patient Details Name: Miguel Spencer MRN: [id number] DOB: 1951/03/21 Today's Date: 03/08/2019   History of Present Illness  Miguel Spencer  is a 68 y.o. male, w Hypertension, Pafib (chads2vasc=2) , h/o Gout, recent admission for GI bleeding/coffee ground emesis 02/25/19.  Pt was discharged on 5/24 and apparently since then has had loose liquid stool with rectal bleeding.  pt notes he resumed his Eliquis on discharge and took 5 indomethacin for gout attack while at home.  Pt states has been compliant with protonix/pepcid.   Pt denies fever, chills, cp, palp, sob, n/v, abd pain, black stool.      Clinical Impression  Patient functioning at baseline for functional mobility and gait.  Plan:  Patient discharged from physical therapy to care of nursing for ambulation daily as tolerated for length of stay.     Follow Up Recommendations No PT follow up    Equipment Recommendations  None recommended by PT    Recommendations for Other Services       Precautions / Restrictions Precautions Precautions: None Restrictions Weight Bearing Restrictions: No      Mobility  Bed Mobility Overal bed mobility: Independent                Transfers Overall transfer level: Independent                  Ambulation/Gait Ambulation/Gait assistance: Independent Gait Distance (Feet): 200 Feet Assistive device: None Gait Pattern/deviations: WFL(Within Functional Limits) Gait velocity: near normal   General Gait Details: demonstrates good return for ambulation on level, inclined and declined surfaces without loss of balance  Stairs Stairs: Yes Stairs assistance: Modified independent (Device/Increase time) Stair Management: One rail Left;Alternating pattern Number of Stairs: 9 General stair comments: demonstrates good return for going up steps without use of siderail and coming down steps using 1 siderail without loss of balance  Wheelchair Mobility     Modified Rankin (Stroke Patients Only)       Balance Overall balance assessment: Independent                                           Pertinent Vitals/Pain Pain Assessment: No/denies pain    Home Living Family/patient expects to be discharged to:: Private residence Living Arrangements: Alone Available Help at Discharge: Family Type of Home: Apartment Home Access: Stairs to enter Entrance Stairs-Rails: Right;Left;Can reach both Technical brewer of Steps: 15 Home Layout: Two level Home Equipment: None      Prior Function Level of Independence: Independent         Comments: Hydrographic surveyor, 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     Hand Dominance        Extremity/Trunk Assessment   Upper Extremity Assessment Upper Extremity Assessment: Overall WFL for tasks assessed    Lower Extremity Assessment Lower Extremity Assessment: Overall WFL for tasks assessed    Cervical / Trunk Assessment Cervical / Trunk Assessment: Normal  Communication   Communication: No difficulties  Cognition Arousal/Alertness: Awake/alert Behavior During Therapy: WFL for tasks assessed/performed Overall Cognitive Status: Within Functional Limits for tasks assessed                                        General Comments  Exercises     Assessment/Plan    PT Assessment Patent does not need any further PT services  PT Problem List         PT Treatment Interventions      PT Goals (Current goals can be found in the Care Plan section)  Acute Rehab PT Goals Patient Stated Goal: return home PT Goal Formulation: With patient Time For Goal Achievement: 03/08/19 Potential to Achieve Goals: Good    Frequency     Barriers to discharge        Co-evaluation               AM-PAC PT "6 Clicks" Mobility  Outcome Measure Help needed turning from your back to your side while in a flat bed without using bedrails?:  None Help needed moving from lying on your back to sitting on the side of a flat bed without using bedrails?: None Help needed moving to and from a bed to a chair (including a wheelchair)?: None Help needed standing up from a chair using your arms (e.g., wheelchair or bedside chair)?: None Help needed to walk in hospital room?: None Help needed climbing 3-5 steps with a railing? : None 6 Click Score: 24    End of Session   Activity Tolerance: Patient tolerated treatment well Patient left: in bed;with call bell/phone within reach Nurse Communication: Mobility status PT Visit Diagnosis: Unsteadiness on feet (R26.81);Other abnormalities of gait and mobility (R26.89);Muscle weakness (generalized) (M62.81)    Time: 2992-4268 PT Time Calculation (min) (ACUTE ONLY): 25 min   Charges:   PT Evaluation $PT Eval Moderate Complexity: 1 Mod PT Treatments $Gait Training: 23-37 mins        12:28 PM, 03/08/19 Lonell Grandchild, MPT Physical Therapist with [facility] 336 [phone] office [phone] mobile phone

## 2019-03-09 DIAGNOSIS — K625 Hemorrhage of anus and rectum: Secondary | ICD-10-CM

## 2019-03-09 DIAGNOSIS — D62 Acute posthemorrhagic anemia: Secondary | ICD-10-CM

## 2019-03-09 LAB — CBC
HCT: 26.6 % — ABNORMAL LOW (ref 39.0–52.0)
Hemoglobin: 8.6 g/dL — ABNORMAL LOW (ref 13.0–17.0)
MCH: 31.5 pg (ref 26.0–34.0)
MCHC: 32.3 g/dL (ref 30.0–36.0)
MCV: 97.4 fL (ref 80.0–100.0)
Platelets: 362 10*3/uL (ref 150–400)
RBC: 2.73 MIL/uL — ABNORMAL LOW (ref 4.22–5.81)
RDW: 14.1 % (ref 11.5–15.5)
WBC: 7.7 10*3/uL (ref 4.0–10.5)
nRBC: 0 % (ref 0.0–0.2)

## 2019-03-09 MED ORDER — HYDROCODONE-ACETAMINOPHEN 5-325 MG PO TABS: 1.0000 | ORAL_TABLET | Freq: Four times a day (QID) | ORAL | Status: DC | PRN

## 2019-03-09 MED ORDER — DICYCLOMINE HCL 10 MG PO CAPS
10.0000 mg | ORAL_CAPSULE | Freq: Three times a day (TID) | ORAL | Status: DC
  Administered 2019-03-09 – 2019-03-10 (×3): 10 mg via ORAL
  Filled 2019-03-09 (×3): qty 1

## 2019-03-09 MED ORDER — LORAZEPAM 0.5 MG PO TABS
0.5000 mg | ORAL_TABLET | ORAL | Status: DC | PRN
  Administered 2019-03-09: 0.5 mg via ORAL
  Administered 2019-03-09: 1 mg via ORAL
  Filled 2019-03-09: qty 1
  Filled 2019-03-09: qty 2

## 2019-03-09 MED ORDER — LORAZEPAM 2 MG/ML IJ SOLN: 0.5000 mg | INTRAMUSCULAR | Status: DC | PRN

## 2019-03-09 NOTE — Progress Notes (Signed)
PROGRESS NOTE  Miguel Spencer  UQJ:[id number]  DOB: [birth year]  DOA: 03/03/2019 PCP: Miguel Squibb, MD  Brief Admission Hx: 68 y/o male on apixaban for PAF, HTN, presented with readmitted with recurrent GI bleeding and abdominal pain with malaise.   MDM/Assessment & Plan:   1. Rectal bleeding / Colitis -  Significantly improved.  Pt reported a significant amount of rectal bleeding since restarting apixaban and taking indomethacin for gout.  He has been seen by GI and had a colonoscopy done 03/05/19 with findings concerning for ischemic colitis, however CTA abd/pelvis with no signs of vascular compromise.   He seems to be improving with antibiotics which have been de-escalated.  Started probiotics 6/1.  Continue protonix.  Awaiting biopsy results.   2. Acute on chronic blood loss anemia - transfuse as needed. Follow CBC.  Hg stable at 7.3. Holding apixaban for now.  3. Abdominal pain - LLQ abdominal pain seems to be improving with antibiotics. He is tolerating soft diet.   4. PAF - He will be maintained on home toprol XL and cardizem CD.  Holding apixaban temporarily.  5. Gout - Colchicine ordered and symptoms much improved.  Discontinued indomethacin.  May need to hold colchicine if loose stool persists.  Consider changing colchicine to PRN if loose stools persist 6. GAD - alprazolam ordered as needed. 7. Myositis / Arthritis - He has been on daily prednisone prescribed by his rheumatologist for last 3 months, it is being weaned down, now down to 6 mg daily.  Don't want to abruptly stop this, resumed at lower dose of 4 mg daily to continue weaning.  Pt to follow up with rheumatologist to continue to wean off prednisone.    DVT prophylaxis: SCDs Code Status: Full  Family Communication: updated uncle telephone Disposition Plan: home in 1-2 days  Consultants:  GI  Procedures:    Antimicrobials:   zosyn 5/28 - 5/31  augmentin 6/1-6/2  Subjective: Pt reports abdominal pain improving.   He continues having loose stools.  No rectal bleeding.  He was able to eat more with supper last night.  He is concerned about being discharged home too soon.    Objective: Vitals:   03/08/19 2125 03/09/19 0500 03/09/19 0509 03/09/19 0741  BP: 110/78  112/80   Pulse: (!) 57  91   Resp: 16  16   Temp: 98.2 F (36.8 C)  98.3 F (36.8 C)   TempSrc: Oral  Oral   SpO2: 98%  98% 98%  Weight:  84.8 kg    Height:        Intake/Output Summary (Last 24 hours) at 03/09/2019 1038 Last data filed at 03/09/2019 1017 Gross per 24 hour  Intake 1320 ml  Output 3 ml  Net 1317 ml   Filed Weights   03/05/19 1232 03/08/19 0623 03/09/19 0500  Weight: 92.9 kg 85.3 kg 84.8 kg   REVIEW OF SYSTEMS  As per history otherwise all reviewed and reported negative  Exam:  General exam: awake, alert, NAD. Cooperative.   Respiratory system: Clear. No increased work of breathing. Cardiovascular system: S1 & S2 heard. No JVD, murmurs, gallops, clicks or pedal edema. Gastrointestinal system: Abdomen is soft and nontender. Normal bowel sounds heard. Central nervous system: Alert and oriented. No focal neurological deficits. Extremities: no CCE.  Data Reviewed: Basic Metabolic Panel: Recent Labs  Lab 03/04/19 0449 03/05/19 0427 03/06/19 0433 03/07/19 0609 03/08/19 0546  NA 138 136 135 138 137  K 3.7 3.5 3.3* 3.5  3.8  CL 109 107 107 106 103  CO2 22 22 22 22 24   GLUCOSE 94 98 127* 99 96  BUN 11 9 9  7* 6*  CREATININE 0.95 1.10 1.20 1.09 1.08  CALCIUM 7.9* 8.0* 7.9* 8.1* 8.5*  MG  --   --   --  2.0  --    Liver Function Tests: Recent Labs  Lab 03/04/19 0449 03/05/19 0427 03/06/19 0433 03/07/19 0609 03/08/19 0546  AST 12* 10* 11* 11* 24  ALT 18 16 14 17 29   ALKPHOS 39 40 35* 37* 41  BILITOT 0.5 0.6 <0.1* 0.2* 0.2*  PROT 5.1* 5.3* 4.9* 5.5* 5.8*  ALBUMIN 2.6* 2.7* 2.5* 2.7* 2.9*   No results for input(s): LIPASE, AMYLASE in the last 168 hours. No results for input(s): AMMONIA in the last  168 hours. CBC: Recent Labs  Lab 03/03/19 1611  03/05/19 2349 03/06/19 0433 03/07/19 0609 03/08/19 0546 03/09/19 0533  WBC 8.7   < > 6.0 5.7 4.5 6.9 7.7  NEUTROABS 5.8  --   --   --   --   --   --   HGB 8.8*   < > 7.1* 7.3* 7.1* 7.8* 8.6*  HCT 26.5*   < > 22.3* 22.3* 21.7* 24.2* 26.6*  MCV 96.4   < > 100.0 98.7 98.6 97.6 97.4  PLT 295   < > 283 281 303 360 362   < > = values in this interval not displayed.   Cardiac Enzymes: No results for input(s): CKTOTAL, CKMB, CKMBINDEX, TROPONINI in the last 168 hours. CBG (last 3)  No results for input(s): GLUCAP in the last 72 hours. Recent Results (from the past 240 hour(s))  Gastrointestinal Panel by PCR , Stool     Status: None   Collection Time: 02/28/19  2:00 AM  Result Value Ref Range Status   Campylobacter species NOT DETECTED NOT DETECTED Final   Plesimonas shigelloides NOT DETECTED NOT DETECTED Final   Salmonella species NOT DETECTED NOT DETECTED Final   Yersinia enterocolitica NOT DETECTED NOT DETECTED Final   Vibrio species NOT DETECTED NOT DETECTED Final   Vibrio cholerae NOT DETECTED NOT DETECTED Final   Enteroaggregative E coli (EAEC) NOT DETECTED NOT DETECTED Final   Enteropathogenic E coli (EPEC) NOT DETECTED NOT DETECTED Final   Enterotoxigenic E coli (ETEC) NOT DETECTED NOT DETECTED Final   Shiga like toxin producing E coli (STEC) NOT DETECTED NOT DETECTED Final   Shigella/Enteroinvasive E coli (EIEC) NOT DETECTED NOT DETECTED Final   Cryptosporidium NOT DETECTED NOT DETECTED Final   Cyclospora cayetanensis NOT DETECTED NOT DETECTED Final   Entamoeba histolytica NOT DETECTED NOT DETECTED Final   Giardia lamblia NOT DETECTED NOT DETECTED Final   Adenovirus F40/41 NOT DETECTED NOT DETECTED Final   Astrovirus NOT DETECTED NOT DETECTED Final   Norovirus GI/GII NOT DETECTED NOT DETECTED Final   Rotavirus A NOT DETECTED NOT DETECTED Final   Sapovirus (I, II, IV, and V) NOT DETECTED NOT DETECTED Final    Comment:  Performed at [facility], [address] [place], [place] [address]  SARS Coronavirus 2 (CEPHEID - Performed in [place] hospital lab), Hosp Order     Status: None   Collection Time: 03/03/19  6:05 PM  Result Value Ref Range Status   SARS Coronavirus 2 NEGATIVE NEGATIVE Final    Comment: (NOTE) If result is NEGATIVE SARS-CoV-2 target nucleic acids are NOT DETECTED. The SARS-CoV-2 RNA is generally detectable in upper and lower  respiratory  specimens during the acute phase of infection. The lowest  concentration of SARS-CoV-2 viral copies this assay can detect is 250  copies / mL. A negative result does not preclude SARS-CoV-2 infection  and should not be used as the sole basis for treatment or other  patient management decisions.  A negative result may occur with  improper specimen collection / handling, submission of specimen other  than nasopharyngeal swab, presence of viral mutation(s) within the  areas targeted by this assay, and inadequate number of viral copies  (<250 copies / mL). A negative result must be combined with clinical  observations, patient history, and epidemiological information. If result is POSITIVE SARS-CoV-2 target nucleic acids are DETECTED. The SARS-CoV-2 RNA is generally detectable in upper and lower  respiratory specimens dur ing the acute phase of infection.  Positive  results are indicative of active infection with SARS-CoV-2.  Clinical  correlation with patient history and other diagnostic information is  necessary to determine patient infection status.  Positive results do  not rule out bacterial infection or co-infection with other viruses. If result is PRESUMPTIVE POSTIVE SARS-CoV-2 nucleic acids MAY BE PRESENT.   A presumptive positive result was obtained on the submitted specimen  and confirmed on repeat testing.  While 2019 novel coronavirus  (SARS-CoV-2) nucleic acids may be present in the submitted sample  additional confirmatory  testing may be necessary for epidemiological  and / or clinical management purposes  to differentiate between  SARS-CoV-2 and other Sarbecovirus currently known to infect humans.  If clinically indicated additional testing with an alternate test  methodology [phone]) is advised. The SARS-CoV-2 RNA is generally  detectable in upper and lower respiratory sp ecimens during the acute  phase of infection. The expected result is Negative. Fact Sheet for Patients:  StrictlyIdeas.no Fact Sheet for Healthcare Providers: BankingDealers.co.za This test is not yet approved or cleared by the [place] FDA and has been authorized for detection and/or diagnosis of SARS-CoV-2 by FDA under an Emergency Use Authorization (EUA).  This EUA will remain in effect (meaning this test can be used) for the duration of the COVID-19 declaration under Section 564(b)(1) of the Act, 21 U.S.C. section 360bbb-3(b)(1), unless the authorization is terminated or revoked sooner. Performed at [facility], [address], [place], [place] [address]   Gastrointestinal Panel by PCR , Stool     Status: None   Collection Time: 03/03/19  9:39 PM  Result Value Ref Range Status   Campylobacter species NOT DETECTED NOT DETECTED Final   Plesimonas shigelloides NOT DETECTED NOT DETECTED Final   Salmonella species NOT DETECTED NOT DETECTED Final   Yersinia enterocolitica NOT DETECTED NOT DETECTED Final   Vibrio species NOT DETECTED NOT DETECTED Final   Vibrio cholerae NOT DETECTED NOT DETECTED Final   Enteroaggregative E coli (EAEC) NOT DETECTED NOT DETECTED Final   Enteropathogenic E coli (EPEC) NOT DETECTED NOT DETECTED Final   Enterotoxigenic E coli (ETEC) NOT DETECTED NOT DETECTED Final   Shiga like toxin producing E coli (STEC) NOT DETECTED NOT DETECTED Final   Shigella/Enteroinvasive E coli (EIEC) NOT DETECTED NOT DETECTED Final   Cryptosporidium NOT DETECTED NOT DETECTED  Final   Cyclospora cayetanensis NOT DETECTED NOT DETECTED Final   Entamoeba histolytica NOT DETECTED NOT DETECTED Final   Giardia lamblia NOT DETECTED NOT DETECTED Final   Adenovirus F40/41 NOT DETECTED NOT DETECTED Final   Astrovirus NOT DETECTED NOT DETECTED Final   Norovirus GI/GII NOT DETECTED NOT DETECTED Final   Rotavirus A  NOT DETECTED NOT DETECTED Final   Sapovirus (I, II, IV, and V) NOT DETECTED NOT DETECTED Final    Comment: Performed at [facility], [address] [place], [place] [address]  C difficile quick scan w PCR reflex     Status: None   Collection Time: 03/03/19  9:39 PM  Result Value Ref Range Status   C Diff antigen NEGATIVE NEGATIVE Final   C Diff toxin NEGATIVE NEGATIVE Final   C Diff interpretation No C. difficile detected.  Final    Comment: Performed at [facility], [address] [place], [place] [address]     Studies: No results found. Scheduled Meds: . Chlorhexidine Gluconate Cloth  6 each Topical Daily  . colchicine  0.6 mg Oral Daily  . dicyclomine  10 mg Oral TID AC  . diltiazem  180 mg Oral Daily  . metoprolol succinate  100 mg Oral Daily  . pantoprazole  40 mg Oral BID AC  . predniSONE  4 mg Oral Q breakfast  . saccharomyces boulardii  250 mg Oral BID  . sodium chloride flush  3 mL Intravenous Q12H   Continuous Infusions: . sodium chloride    . sodium chloride 250 mL (03/06/19 1515)    Active Problems:   Hypertension   Gout   Anemia   GI bleeding   AF (paroxysmal atrial fibrillation) (HCC)   Acute blood loss anemia   Acute GI bleeding   Noninfectious gastroenteritis   Rectal bleeding  Irwin Brakeman, MD [facility] 03/09/2019, 10:38 AM    LOS: 5 days  How to contact the [facility] Attending or Consulting provider [place] or covering provider during after hours [place], for this patient?  1. Check the care team in [facility] and look for a) attending/consulting [facility] provider listed and b) the [facility] team listed 2. Log into  [email or web address] and use 's universal password to access. If you do not have the password, please contact the hospital operator. 3. Locate the [facility] provider you are looking for under [facility] and page to a number that you can be directly reached. 4. If you still have difficulty reaching the provider, please page the [facility] (Director on Call) for the Hospitalists listed on amion for assistance.

## 2019-03-09 NOTE — Progress Notes (Signed)
    Subjective: Feels improved today. 6 loose stools yesterday, 2 thus far this morning. Looks like "creek water". Liquid but with some stool particles in it. No further abdominal discomfort. Feels like stool output is improving. Tolerating diet. Appetite is better. Overall feels better from admission. Concerned about going home too soon. No overt bleeding.  Objective: Vital signs in last 24 hours: Temp:  [98.1 F (36.7 C)-98.3 F (36.8 C)] 98.3 F (36.8 C) (06/02 0509) Pulse Rate:  [57-91] 91 (06/02 0509) Resp:  [16-18] 16 (06/02 0509) BP: (110-118)/(78-105) 112/80 (06/02 0509) SpO2:  [98 %-100 %] 98 % (06/02 0741) Weight:  [84.8 kg] 84.8 kg (06/02 0500) Last BM Date: 03/08/19 General:   Alert and oriented, pleasant Head:  Normocephalic and atraumatic. Abdomen:  Bowel sounds present, soft, non-tender, non-distended. Small umbilical hernia Neurologic:  Alert and  oriented x4 Psych:  Alert and cooperative. Normal mood and affect.  Intake/Output from previous day: 06/01 0701 - 06/02 0700 In: 1200 [P.O.:1200] Out: 5 [Urine:3; Stool:2] Intake/Output this shift: No intake/output data recorded.  Lab Results: Recent Labs    03/07/19 0609 03/08/19 0546 03/09/19 0533  WBC 4.5 6.9 7.7  HGB 7.1* 7.8* 8.6*  HCT 21.7* 24.2* 26.6*  PLT 303 360 362   BMET Recent Labs    03/07/19 0609 03/08/19 0546  NA 138 137  K 3.5 3.8  CL 106 103  CO2 22 24  GLUCOSE 99 96  BUN 7* 6*  CREATININE 1.09 1.08  CALCIUM 8.1* 8.5*   LFT Recent Labs    03/07/19 0609 03/08/19 0546  PROT 5.5* 5.8*  ALBUMIN 2.7* 2.9*  AST 11* 24  ALT 17 29  ALKPHOS 37* 41  BILITOT 0.2* 0.2*    Assessment: 68 year old male admitted with GI bleed on Eliquis, s/p colonoscopy with extensive ascending colon ulceration concerning for ischemia. Pathology now resulted with benign colorectal type mucosa with associated fibrinopurulent material without evidence for IBD, dysplasia, or malignancy. Differential  including ischemic related changes, which is most likely culprit for presentation. CTA with atherosclerotic changes but no significant stenosis.Diarrhea on admission with negative Cdiff and GI pathogen panel. Tolerating diet but still with persistent loose stool. Bentyl ordered as prn but has not received yet. Will change to scheduled.   Acute blood loss anemia: Hgb improving. No bleeding since colonoscopy.  Plan: Change Bentyl to scheduled Continue probiotic Continue PPI BID Lactose free diet Timing of resuming Eliquis to be determined   Annitta Needs, PhD, ANP-BC [facility]     LOS: 5 days    03/09/2019, 8:58 AM

## 2019-03-10 ENCOUNTER — Telehealth: Payer: Self-pay | Admitting: Gastroenterology

## 2019-03-10 ENCOUNTER — Encounter: Payer: Self-pay | Admitting: Internal Medicine

## 2019-03-10 DIAGNOSIS — K559 Vascular disorder of intestine, unspecified: Principal | ICD-10-CM

## 2019-03-10 MED ORDER — PREDNISONE 1 MG PO TABS: 4.0000 mg | ORAL_TABLET | Freq: Every day | ORAL | 3 refills | Status: AC

## 2019-03-10 MED ORDER — SACCHAROMYCES BOULARDII 250 MG PO CAPS: 250.0000 mg | ORAL_CAPSULE | Freq: Two times a day (BID) | ORAL | 3 refills | Status: AC

## 2019-03-10 MED ORDER — PANTOPRAZOLE SODIUM 40 MG PO TBEC: 40.0000 mg | DELAYED_RELEASE_TABLET | Freq: Two times a day (BID) | ORAL | 3 refills | Status: DC

## 2019-03-10 MED ORDER — HYDROCODONE-ACETAMINOPHEN 5-325 MG PO TABS: 1.0000 | ORAL_TABLET | Freq: Four times a day (QID) | ORAL | 0 refills | Status: DC | PRN

## 2019-03-10 MED ORDER — APIXABAN 5 MG PO TABS: 5.0000 mg | ORAL_TABLET | Freq: Two times a day (BID) | ORAL | 6 refills | Status: DC

## 2019-03-10 MED ORDER — DICYCLOMINE HCL 10 MG PO CAPS: 10.0000 mg | ORAL_CAPSULE | Freq: Three times a day (TID) | ORAL | 2 refills | Status: DC

## 2019-03-10 MED ORDER — COLCHICINE 0.6 MG PO TABS: 0.6000 mg | ORAL_TABLET | Freq: Every day | ORAL | 0 refills | Status: DC

## 2019-03-10 NOTE — Discharge Summary (Signed)
Physician Discharge Summary  ABDULAHI Spencer PJA:[id number] DOB: [birth year] DOA: 03/03/2019  PCP: Miguel Squibb, MD  Admit date: 03/03/2019  Discharge date: 03/10/2019  Admitted From:Home  Disposition:  Home  Recommendations for Outpatient Follow-up:  1. Follow up with PCP in 1-2 weeks 2. Repeat CBC in 1 week 3. Eliquis to restart on 03/16/2019 4. Remain on PPI twice daily as well as probiotics and Bentyl as needed 5. Follow-up with GI as scheduled 6. Discontinue use of indomethacin  Home Health: None  Equipment/Devices: None  Discharge Condition: Stable  CODE STATUS: Full  Diet recommendation: Heart Healthy  Brief/Interim Summary: 68 y/o male on apixaban for PAF, HTN, presented with readmitted with recurrent GI bleeding and abdominal pain with malaise.   1. Rectal bleeding / Colitis -  Significantly improved.  Pt reported a significant amount of rectal bleeding since restarting apixaban and taking indomethacin for gout.  He has been seen by GI and had a colonoscopy done 03/05/19 with findings concerning for ischemic colitis, however CTA abd/pelvis with no signs of vascular compromise.   He seems to be improving with antibiotics which have been de-escalated.  Awaiting biopsy results.    Follow-up with GI in 8 weeks.  Remain on probiotics and PPI twice daily for now. 2. Acute on chronic blood loss anemia - transfuse as needed. Follow CBC.  Hemoglobin is currently stable at 8.6 this a.m.  Eliquis to resume on 03/16/2019 as recommended. 3. Abdominal pain - LLQ abdominal pain seems to be improving with antibiotics. He is tolerating soft diet.    Abdominal pain has now resolved.  Bentyl as needed for abdominal cramping and diarrhea. 4. PAF - He will be maintained on home toprol XL and cardizem CD.  Holding apixaban temporarily until 03/16/2019 5. Gout - Colchicine ordered and symptoms much improved.  Discontinued indomethacin.  May need to hold colchicine if loose stool persists.   Will discharge on  colchicine and patient has been advised to use as needed if loose stools persist. 6. GAD - alprazolam ordered as needed. 7. Myositis / Arthritis - He has been on daily prednisone prescribed by his rheumatologist for last 3 months, it is being weaned down, now down to 6 mg daily.  Don't want to abruptly stop this, resumed at lower dose of 4 mg daily to continue weaning.  Pt to follow up with rheumatologist to continue to wean off prednisone.    Discharge Diagnoses:  Active Problems:   Hypertension   Gout   Anemia   GI bleeding   AF (paroxysmal atrial fibrillation) (HCC)   Acute blood loss anemia   Acute GI bleeding   Noninfectious gastroenteritis   Rectal bleeding   Ischemic colitis ([place])  Principal discharge diagnosis: Acute on chronic blood loss anemia secondary to lower GI bleed with noted colitis.  Discharge Instructions  Discharge Instructions    Diet - low sodium heart healthy   Complete by:  As directed    Increase activity slowly   Complete by:  As directed      Allergies as of 03/10/2019   No Known Allergies     Medication List    STOP taking these medications   famotidine 40 MG tablet Commonly known as:  PEPCID   indomethacin 50 MG capsule Commonly known as:  INDOCIN     TAKE these medications   apixaban 5 MG Tabs tablet Commonly known as:  Eliquis Take 1 tablet (5 mg total) by mouth 2 (two) times daily. Start  taking on:  March 16, 2019 What changed:  These instructions start on March 16, 2019. If you are unsure what to do until then, ask your doctor or other care provider.   B-COMPLEX PO Take 1 tablet by mouth daily.   colchicine 0.6 MG tablet Take 1 tablet (0.6 mg total) by mouth daily for 30 days. Hold if experiencing diarrhea. Start taking on:  March 11, 2019   cyclobenzaprine 10 MG tablet Commonly known as:  FLEXERIL Take 10 mg by mouth at bedtime as needed for muscle spasms (for sleep).   dicyclomine 10 MG capsule Commonly known as:  BENTYL Take 1  capsule (10 mg total) by mouth 3 (three) times daily before meals for 30 days.   diltiazem 180 MG 24 hr capsule Commonly known as:  CARDIZEM CD Take 1 capsule (180 mg total) by mouth daily.   FISH OIL PO Take 1 capsule by mouth daily.   HYDROcodone-acetaminophen 5-325 MG tablet Commonly known as:  NORCO/VICODIN Take 1 tablet by mouth every 6 (six) hours as needed for severe pain.   LORazepam 0.5 MG tablet Commonly known as:  ATIVAN Take 3 tablets (1.5 mg total) by mouth at bedtime as needed for anxiety.   LYSINE PO Take 1 capsule by mouth daily.   metoprolol succinate 100 MG 24 hr tablet Commonly known as:  TOPROL-XL Take 1 tablet (100 mg total) by mouth daily. Take with or immediately following a meal.   pantoprazole 40 MG tablet Commonly known as:  PROTONIX Take 1 tablet (40 mg total) by mouth 2 (two) times daily before a meal for 30 days. What changed:    how much to take  how to take this  when to take this  additional instructions   predniSONE 1 MG tablet Commonly known as:  DELTASONE Take 4 tablets (4 mg total) by mouth daily with breakfast for 30 days. Start taking on:  March 11, 2019 What changed:    how much to take  when to take this   saccharomyces boulardii 250 MG capsule Commonly known as:  FLORASTOR Take 1 capsule (250 mg total) by mouth 2 (two) times daily for 30 days.   sildenafil 100 MG tablet Commonly known as:  VIAGRA Take 100 mg by mouth daily as needed for erectile dysfunction.   Valtrex 1000 MG tablet Generic drug:  valACYclovir Take 1,000 mg by mouth daily as needed (cold sores).      Follow-up Information    Miguel Squibb, MD Follow up.   Specialty:  Internal Medicine Contact information: [facility] [address] [phone]        [name], MD Follow up in 8 week(s).   Specialty:  Gastroenterology Contact information: [address] [place] [address] [phone]          No Known  Allergies  Consultations:  GI   Procedures/Studies: Ct Abdomen Pelvis W Contrast  Result Date: 03/04/2019 CLINICAL DATA:  Rectal bleeding. EXAM: CT ABDOMEN AND PELVIS WITH CONTRAST TECHNIQUE: Multidetector CT imaging of the abdomen and pelvis was performed using the standard protocol following bolus administration of intravenous contrast. CONTRAST:  37mL OMNIPAQUE IOHEXOL 300 MG/ML SOLN, 112mL OMNIPAQUE IOHEXOL 300 MG/ML SOLN COMPARISON:  CT scan of Feb 25, 2019. FINDINGS: Lower chest: No acute abnormality. Hepatobiliary: No focal liver abnormality is seen. No gallstones, gallbladder wall thickening, or biliary dilatation. Pancreas: Unremarkable. No pancreatic ductal dilatation or surrounding inflammatory changes. Spleen: Normal in size without focal abnormality.  Adrenals/Urinary Tract: Adrenal glands are unremarkable. Kidneys are normal, without renal calculi, focal lesion, or hydronephrosis. Bladder is unremarkable. Stomach/Bowel: The stomach appears normal. There is no evidence of bowel obstruction. The appendix appears normal. Wall thickening with surrounding inflammatory changes is seen involving the right colon consistent with infectious or inflammatory colitis. This may not be significantly changed compared to prior exam. Vascular/Lymphatic: Aortic atherosclerosis. No enlarged abdominal or pelvic lymph nodes. Reproductive: Prostate is unremarkable. Other: Small fat containing periumbilical hernia is noted. No abnormal fluid collection is noted. Musculoskeletal: No acute or significant osseous findings. IMPRESSION: Findings consistent with infectious or inflammatory right-sided colitis. Aortic Atherosclerosis (ICD10-I70.0). Electronically Signed   By: Marijo Conception M.D.   On: 03/04/2019 15:43   Ct Abdomen Pelvis W Contrast  Result Date: 02/25/2019 CLINICAL DATA:  Rectal bleeding with nausea and vomiting for the past 4 days. History of gastric ulcer and alcohol abuse. EXAM: CT ABDOMEN AND PELVIS  WITH CONTRAST TECHNIQUE: Multidetector CT imaging of the abdomen and pelvis was performed using the standard protocol following bolus administration of intravenous contrast. CONTRAST:  158mL OMNIPAQUE IOHEXOL 300 MG/ML  SOLN COMPARISON:  Abdominal ultrasound dated December 23, 2018. CT abdomen pelvis dated January 20, 2013. FINDINGS: Lower chest: No acute abnormality. Hepatobiliary: No focal liver abnormality is seen. No gallstones, gallbladder wall thickening, or biliary dilatation. Pancreas: Unremarkable. No pancreatic ductal dilatation or surrounding inflammatory changes. Spleen: Normal in size without focal abnormality. Adrenals/Urinary Tract: Adrenal glands are unremarkable. Kidneys are normal, without renal calculi, focal lesion, or hydronephrosis. Bladder is unremarkable for the degree of distention. Stomach/Bowel: Moderate circumferential wall thickening of the cecum and ascending colon with mild surrounding inflammatory stranding. The remaining colon is unremarkable. The stomach and small bowel are within normal limits. No obstruction. Normal appendix. Vascular/Lymphatic: Aortic atherosclerosis. No enlarged abdominal or pelvic lymph nodes. Reproductive: Prostate is unremarkable. Other: Unchanged small fat containing umbilical hernia. No free fluid or pneumoperitoneum. Musculoskeletal: No acute or significant osseous findings. Prominent Schmorl's nodes involving the T10 and T11 superior endplates. IMPRESSION: 1. Acute colitis involving the cecum and ascending colon. 2.  Aortic atherosclerosis (ICD10-I70.0). Electronically Signed   By: Titus Dubin M.D.   On: 02/25/2019 11:52   Ct Angio Abd/pel W/ And/or W/o  Result Date: 03/05/2019 CLINICAL DATA:  Concern for mesenteric ischemia EXAM: CTA ABDOMEN AND PELVIS wITHOUT AND WITH CONTRAST TECHNIQUE: Multidetector CT imaging of the abdomen and pelvis was performed using the standard protocol during bolus administration of intravenous contrast. Multiplanar  reconstructed images and MIPs were obtained and reviewed to evaluate the vascular anatomy. CONTRAST:  158mL OMNIPAQUE IOHEXOL 350 MG/ML SOLN COMPARISON:  03/04/2019 FINDINGS: VASCULAR Aorta: Nonaneurysmal and patent. Celiac: Patent.  Branch vessels are patent. SMA: Patent. There is smooth soft and calcified plaque at the origin without significant narrowing. Branch vessels are grossly patent. Renals: A single right renal artery is patent with atherosclerotic calcified plaque at the origin. There are 2 left renal arteries which are patent. IMA: Patent. Inflow: Bilateral common, internal, and external iliac arteries are patent with scattered calcified atherosclerotic plaque. Proximal Outflow: Grossly patent bilaterally with atherosclerotic plaque. No significant narrowing. Veins: No evidence of DVT. Review of the MIP images confirms the above findings. NON-VASCULAR Lower chest: Dependent atelectasis bilaterally. Hepatobiliary: Unremarkable appearance of the liver. Gallbladder is mildly distended without obvious wall thickening or gallstones. Pancreas: Unremarkable Spleen: Unremarkable Adrenals/Urinary Tract: Adrenal glands are within normal limits. Kidneys are within normal limits in appearance. Bladder is distended. Stomach/Bowel: There is persistent  wall thickening involving the ascending colon and hepatic flexure. No evidence of wall thickening or mass in the transverse or descending colon. Sigmoid colon is unremarkable and decompressed. There is no evidence of pneumatosis. There is stranding in the fat adjacent to the ascending colon and hepatic flexure. No extraluminal bowel gas to suggest rupture. No evidence of abscess formation. Small bowel is decompressed.  Stomach is also decompressed. Lymphatic: No abnormal retroperitoneal adenopathy. Reproductive: Prostate is within normal limits. Other: No free-fluid. No free intraperitoneal gas. Postoperative changes from right inguinal herniorrhaphy are noted.  Musculoskeletal: T10 and T11 mild wedge compression deformities have a chronic appearance. They are new compared with imaging from 2014. IMPRESSION: VASCULAR No significant narrowing in the visceral vasculature is identified to cause mesenteric ischemia. Atherosclerotic changes are noted. NON-VASCULAR Persistent wall thickening and inflammatory change involving the ascending colon and hepatic flexure. No evidence of perforation or pneumatosis. Electronically Signed   By: Marybelle Killings M.D.   On: 03/05/2019 15:51     Discharge Exam: Vitals:   03/09/19 2147 03/10/19 0552  BP: 96/82 112/84  Pulse: 86 80  Resp: 20 16  Temp: 98 F (36.7 C) 98.3 F (36.8 C)  SpO2:  97%   Vitals:   03/09/19 0741 03/09/19 2147 03/10/19 0500 03/10/19 0552  BP:  96/82  112/84  Pulse:  86  80  Resp:  20  16  Temp:  98 F (36.7 C)  98.3 F (36.8 C)  TempSrc:  Oral  Oral  SpO2: 98%   97%  Weight:   84.8 kg   Height:        General: Pt is alert, awake, not in acute distress Cardiovascular: RRR, S1/S2 +, no rubs, no gallops Respiratory: CTA bilaterally, no wheezing, no rhonchi Abdominal: Soft, NT, ND, bowel sounds + Extremities: no edema, no cyanosis    The results of significant diagnostics from this hospitalization (including imaging, microbiology, ancillary and laboratory) are listed below for reference.     Microbiology: Recent Results (from the past 240 hour(s))  SARS Coronavirus 2 (CEPHEID - Performed in [place] hospital lab), Hosp Order     Status: None   Collection Time: 03/03/19  6:05 PM  Result Value Ref Range Status   SARS Coronavirus 2 NEGATIVE NEGATIVE Final    Comment: (NOTE) If result is NEGATIVE SARS-CoV-2 target nucleic acids are NOT DETECTED. The SARS-CoV-2 RNA is generally detectable in upper and lower  respiratory specimens during the acute phase of infection. The lowest  concentration of SARS-CoV-2 viral copies this assay can detect is 250  copies / mL. A negative result  does not preclude SARS-CoV-2 infection  and should not be used as the sole basis for treatment or other  patient management decisions.  A negative result may occur with  improper specimen collection / handling, submission of specimen other  than nasopharyngeal swab, presence of viral mutation(s) within the  areas targeted by this assay, and inadequate number of viral copies  (<250 copies / mL). A negative result must be combined with clinical  observations, patient history, and epidemiological information. If result is POSITIVE SARS-CoV-2 target nucleic acids are DETECTED. The SARS-CoV-2 RNA is generally detectable in upper and lower  respiratory specimens dur ing the acute phase of infection.  Positive  results are indicative of active infection with SARS-CoV-2.  Clinical  correlation with patient history and other diagnostic information is  necessary to determine patient infection status.  Positive results do  not rule out bacterial infection  or co-infection with other viruses. If result is PRESUMPTIVE POSTIVE SARS-CoV-2 nucleic acids MAY BE PRESENT.   A presumptive positive result was obtained on the submitted specimen  and confirmed on repeat testing.  While 2019 novel coronavirus  (SARS-CoV-2) nucleic acids may be present in the submitted sample  additional confirmatory testing may be necessary for epidemiological  and / or clinical management purposes  to differentiate between  SARS-CoV-2 and other Sarbecovirus currently known to infect humans.  If clinically indicated additional testing with an alternate test  methodology [phone]) is advised. The SARS-CoV-2 RNA is generally  detectable in upper and lower respiratory sp ecimens during the acute  phase of infection. The expected result is Negative. Fact Sheet for Patients:  StrictlyIdeas.no Fact Sheet for Healthcare Providers: BankingDealers.co.za This test is not yet approved or  cleared by the [place] FDA and has been authorized for detection and/or diagnosis of SARS-CoV-2 by FDA under an Emergency Use Authorization (EUA).  This EUA will remain in effect (meaning this test can be used) for the duration of the COVID-19 declaration under Section 564(b)(1) of the Act, 21 U.S.C. section 360bbb-3(b)(1), unless the authorization is terminated or revoked sooner. Performed at [facility], [address] [place], [place] [address]   Gastrointestinal Panel by PCR , Stool     Status: None   Collection Time: 03/03/19  9:39 PM  Result Value Ref Range Status   Campylobacter species NOT DETECTED NOT DETECTED Final   Plesimonas shigelloides NOT DETECTED NOT DETECTED Final   Salmonella species NOT DETECTED NOT DETECTED Final   Yersinia enterocolitica NOT DETECTED NOT DETECTED Final   Vibrio species NOT DETECTED NOT DETECTED Final   Vibrio cholerae NOT DETECTED NOT DETECTED Final   Enteroaggregative E coli (EAEC) NOT DETECTED NOT DETECTED Final   Enteropathogenic E coli (EPEC) NOT DETECTED NOT DETECTED Final   Enterotoxigenic E coli (ETEC) NOT DETECTED NOT DETECTED Final   Shiga like toxin producing E coli (STEC) NOT DETECTED NOT DETECTED Final   Shigella/Enteroinvasive E coli (EIEC) NOT DETECTED NOT DETECTED Final   Cryptosporidium NOT DETECTED NOT DETECTED Final   Cyclospora cayetanensis NOT DETECTED NOT DETECTED Final   Entamoeba histolytica NOT DETECTED NOT DETECTED Final   Giardia lamblia NOT DETECTED NOT DETECTED Final   Adenovirus F40/41 NOT DETECTED NOT DETECTED Final   Astrovirus NOT DETECTED NOT DETECTED Final   Norovirus GI/GII NOT DETECTED NOT DETECTED Final   Rotavirus A NOT DETECTED NOT DETECTED Final   Sapovirus (I, II, IV, and V) NOT DETECTED NOT DETECTED Final    Comment: Performed at [facility], [address] [place], [place] [address]  C difficile quick scan w PCR reflex     Status: None   Collection Time: 03/03/19  9:39 PM  Result  Value Ref Range Status   C Diff antigen NEGATIVE NEGATIVE Final   C Diff toxin NEGATIVE NEGATIVE Final   C Diff interpretation No C. difficile detected.  Final    Comment: Performed at [facility], [address] [place], [place] [address]     Labs: BNP (last 3 results) No results for input(s): BNP in the last 8760 hours. Basic Metabolic Panel: Recent Labs  Lab 03/04/19 0449 03/05/19 0427 03/06/19 0433 03/07/19 0609 03/08/19 0546  NA 138 136 135 138 137  K 3.7 3.5 3.3* 3.5 3.8  CL 109 107 107 106 103  CO2 22 22 22 22 24   GLUCOSE 94 98 127* 99 96  BUN 11 9 9  7* 6*  CREATININE 0.95 1.10 1.20 1.09 1.08  CALCIUM 7.9* 8.0* 7.9* 8.1* 8.5*  MG  --   --   --  2.0  --    Liver Function Tests: Recent Labs  Lab 03/04/19 0449 03/05/19 0427 03/06/19 0433 03/07/19 0609 03/08/19 0546  AST 12* 10* 11* 11* 24  ALT 18 16 14 17 29   ALKPHOS 39 40 35* 37* 41  BILITOT 0.5 0.6 <0.1* 0.2* 0.2*  PROT 5.1* 5.3* 4.9* 5.5* 5.8*  ALBUMIN 2.6* 2.7* 2.5* 2.7* 2.9*   No results for input(s): LIPASE, AMYLASE in the last 168 hours. No results for input(s): AMMONIA in the last 168 hours. CBC: Recent Labs  Lab 03/03/19 1611  03/05/19 2349 03/06/19 0433 03/07/19 0609 03/08/19 0546 03/09/19 0533  WBC 8.7   < > 6.0 5.7 4.5 6.9 7.7  NEUTROABS 5.8  --   --   --   --   --   --   HGB 8.8*   < > 7.1* 7.3* 7.1* 7.8* 8.6*  HCT 26.5*   < > 22.3* 22.3* 21.7* 24.2* 26.6*  MCV 96.4   < > 100.0 98.7 98.6 97.6 97.4  PLT 295   < > 283 281 303 360 362   < > = values in this interval not displayed.   Cardiac Enzymes: No results for input(s): CKTOTAL, CKMB, CKMBINDEX, TROPONINI in the last 168 hours. BNP: Invalid input(s): POCBNP CBG: No results for input(s): GLUCAP in the last 168 hours. D-Dimer No results for input(s): DDIMER in the last 72 hours. Hgb A1c No results for input(s): HGBA1C in the last 72 hours. Lipid Profile No results for input(s): CHOL, HDL, LDLCALC, TRIG, CHOLHDL, LDLDIRECT in  the last 72 hours. Thyroid function studies No results for input(s): TSH, T4TOTAL, T3FREE, THYROIDAB in the last 72 hours.  Invalid input(s): FREET3 Anemia work up No results for input(s): VITAMINB12, FOLATE, FERRITIN, TIBC, IRON, RETICCTPCT in the last 72 hours. Urinalysis    Component Value Date/Time   COLORURINE YELLOW 02/20/2013 2236   APPEARANCEUR CLEAR 02/20/2013 2236   LABSPEC 1.025 02/20/2013 2236   PHURINE 6.0 02/20/2013 2236   GLUCOSEU NEGATIVE 02/20/2013 2236   HGBUR MODERATE (A) 02/20/2013 2236   BILIRUBINUR NEGATIVE 02/20/2013 2236   KETONESUR TRACE (A) 02/20/2013 2236   PROTEINUR NEGATIVE 02/20/2013 2236   UROBILINOGEN 0.2 02/20/2013 2236   NITRITE NEGATIVE 02/20/2013 2236   LEUKOCYTESUR NEGATIVE 02/20/2013 2236   Sepsis Labs Invalid input(s): PROCALCITONIN,  WBC,  LACTICIDVEN Microbiology Recent Results (from the past 240 hour(s))  SARS Coronavirus 2 (CEPHEID - Performed in [place] hospital lab), Hosp Order     Status: None   Collection Time: 03/03/19  6:05 PM  Result Value Ref Range Status   SARS Coronavirus 2 NEGATIVE NEGATIVE Final    Comment: (NOTE) If result is NEGATIVE SARS-CoV-2 target nucleic acids are NOT DETECTED. The SARS-CoV-2 RNA is generally detectable in upper and lower  respiratory specimens during the acute phase of infection. The lowest  concentration of SARS-CoV-2 viral copies this assay can detect is 250  copies / mL. A negative result does not preclude SARS-CoV-2 infection  and should not be used as the sole basis for treatment or other  patient management decisions.  A negative result may occur with  improper specimen collection / handling, submission of specimen other  than nasopharyngeal swab, presence of viral mutation(s) within the  areas targeted by this assay, and inadequate number of viral copies  (<250 copies /  mL). A negative result must be combined with clinical  observations, patient history, and epidemiological  information. If result is POSITIVE SARS-CoV-2 target nucleic acids are DETECTED. The SARS-CoV-2 RNA is generally detectable in upper and lower  respiratory specimens dur ing the acute phase of infection.  Positive  results are indicative of active infection with SARS-CoV-2.  Clinical  correlation with patient history and other diagnostic information is  necessary to determine patient infection status.  Positive results do  not rule out bacterial infection or co-infection with other viruses. If result is PRESUMPTIVE POSTIVE SARS-CoV-2 nucleic acids MAY BE PRESENT.   A presumptive positive result was obtained on the submitted specimen  and confirmed on repeat testing.  While 2019 novel coronavirus  (SARS-CoV-2) nucleic acids may be present in the submitted sample  additional confirmatory testing may be necessary for epidemiological  and / or clinical management purposes  to differentiate between  SARS-CoV-2 and other Sarbecovirus currently known to infect humans.  If clinically indicated additional testing with an alternate test  methodology [phone]) is advised. The SARS-CoV-2 RNA is generally  detectable in upper and lower respiratory sp ecimens during the acute  phase of infection. The expected result is Negative. Fact Sheet for Patients:  StrictlyIdeas.no Fact Sheet for Healthcare Providers: BankingDealers.co.za This test is not yet approved or cleared by the [place] FDA and has been authorized for detection and/or diagnosis of SARS-CoV-2 by FDA under an Emergency Use Authorization (EUA).  This EUA will remain in effect (meaning this test can be used) for the duration of the COVID-19 declaration under Section 564(b)(1) of the Act, 21 U.S.C. section 360bbb-3(b)(1), unless the authorization is terminated or revoked sooner. Performed at [facility], [address] [place], [place] [address]   Gastrointestinal Panel by PCR , Stool      Status: None   Collection Time: 03/03/19  9:39 PM  Result Value Ref Range Status   Campylobacter species NOT DETECTED NOT DETECTED Final   Plesimonas shigelloides NOT DETECTED NOT DETECTED Final   Salmonella species NOT DETECTED NOT DETECTED Final   Yersinia enterocolitica NOT DETECTED NOT DETECTED Final   Vibrio species NOT DETECTED NOT DETECTED Final   Vibrio cholerae NOT DETECTED NOT DETECTED Final   Enteroaggregative E coli (EAEC) NOT DETECTED NOT DETECTED Final   Enteropathogenic E coli (EPEC) NOT DETECTED NOT DETECTED Final   Enterotoxigenic E coli (ETEC) NOT DETECTED NOT DETECTED Final   Shiga like toxin producing E coli (STEC) NOT DETECTED NOT DETECTED Final   Shigella/Enteroinvasive E coli (EIEC) NOT DETECTED NOT DETECTED Final   Cryptosporidium NOT DETECTED NOT DETECTED Final   Cyclospora cayetanensis NOT DETECTED NOT DETECTED Final   Entamoeba histolytica NOT DETECTED NOT DETECTED Final   Giardia lamblia NOT DETECTED NOT DETECTED Final   Adenovirus F40/41 NOT DETECTED NOT DETECTED Final   Astrovirus NOT DETECTED NOT DETECTED Final   Norovirus GI/GII NOT DETECTED NOT DETECTED Final   Rotavirus A NOT DETECTED NOT DETECTED Final   Sapovirus (I, II, IV, and V) NOT DETECTED NOT DETECTED Final    Comment: Performed at [facility], [address] [place], [place] [address]  C difficile quick scan w PCR reflex     Status: None   Collection Time: 03/03/19  9:39 PM  Result Value Ref Range Status   C Diff antigen NEGATIVE NEGATIVE Final   C Diff toxin NEGATIVE NEGATIVE Final   C Diff interpretation No C. difficile detected.  Final    Comment: Performed  at [facility], [address] [place], [place] [address]     Time coordinating discharge: 35 minutes  SIGNED:   Rodena Goldmann, DO [facility] 03/10/2019, 11:01 AM  If 7PM-7AM, please contact night-coverage [email or web address] Password [email or web address]

## 2019-03-10 NOTE — Telephone Encounter (Signed)
Please arrange for hospital follow up with [facility] only in 8 weeks. If none available, then keep appt with EG.

## 2019-03-10 NOTE — Progress Notes (Signed)
Patient discharged home with instructions given on medications and follow up visits,patient  verbalized understanding. Prescriptions sent to Pharmacy of choice documented on AVS. Accompanied by staff to an awaiting vehicle. 

## 2019-03-10 NOTE — Progress Notes (Signed)
Subjective:  Wants to go home. No bleeding. Three BMS this morning but resting for 5-6 hours overnight without BMs. Slight form to stool this morning.   Objective: Vital signs in last 24 hours: Temp:  [98 F (36.7 C)-98.3 F (36.8 C)] 98.3 F (36.8 C) (06/03 0552) Pulse Rate:  [80-86] 80 (06/03 0552) Resp:  [16-20] 16 (06/03 0552) BP: (96-112)/(82-84) 112/84 (06/03 0552) SpO2:  [97 %] 97 % (06/03 0552) Weight:  [84.8 kg] 84.8 kg (06/03 0500) Last BM Date: 03/09/19 General:   Alert,  Well-developed, well-nourished, pleasant and cooperative in NAD Head:  Normocephalic and atraumatic. Eyes:  Sclera clear, no icterus.  Abdomen:  Soft, nontender and nondistended.  Extremities:  Without clubbing, deformity or edema. Neurologic:  Alert and  oriented x4;  grossly normal neurologically. Skin:  Intact without significant lesions or rashes. Psych:  Alert and cooperative. Normal mood and affect.  Intake/Output from previous day: 06/02 0701 - 06/03 0700 In: 720 [P.O.:720] Out: -  Intake/Output this shift: No intake/output data recorded.  Lab Results: CBC Recent Labs    03/08/19 0546 03/09/19 0533  WBC 6.9 7.7  HGB 7.8* 8.6*  HCT 24.2* 26.6*  MCV 97.6 97.4  PLT 360 362   BMET Recent Labs    03/08/19 0546  NA 137  K 3.8  CL 103  CO2 24  GLUCOSE 96  BUN 6*  CREATININE 1.08  CALCIUM 8.5*   LFTs Recent Labs    03/08/19 0546  BILITOT 0.2*  ALKPHOS 41  AST 24  ALT 29  PROT 5.8*  ALBUMIN 2.9*   No results for input(s): LIPASE in the last 72 hours. PT/INR No results for input(s): LABPROT, INR in the last 72 hours.    Imaging Studies: Ct Abdomen Pelvis W Contrast  Result Date: 03/04/2019 CLINICAL DATA:  Rectal bleeding. EXAM: CT ABDOMEN AND PELVIS WITH CONTRAST TECHNIQUE: Multidetector CT imaging of the abdomen and pelvis was performed using the standard protocol following bolus administration of intravenous contrast. CONTRAST:  66mL OMNIPAQUE IOHEXOL 300 MG/ML  SOLN, 14mL OMNIPAQUE IOHEXOL 300 MG/ML SOLN COMPARISON:  CT scan of Feb 25, 2019. FINDINGS: Lower chest: No acute abnormality. Hepatobiliary: No focal liver abnormality is seen. No gallstones, gallbladder wall thickening, or biliary dilatation. Pancreas: Unremarkable. No pancreatic ductal dilatation or surrounding inflammatory changes. Spleen: Normal in size without focal abnormality. Adrenals/Urinary Tract: Adrenal glands are unremarkable. Kidneys are normal, without renal calculi, focal lesion, or hydronephrosis. Bladder is unremarkable. Stomach/Bowel: The stomach appears normal. There is no evidence of bowel obstruction. The appendix appears normal. Wall thickening with surrounding inflammatory changes is seen involving the right colon consistent with infectious or inflammatory colitis. This may not be significantly changed compared to prior exam. Vascular/Lymphatic: Aortic atherosclerosis. No enlarged abdominal or pelvic lymph nodes. Reproductive: Prostate is unremarkable. Other: Small fat containing periumbilical hernia is noted. No abnormal fluid collection is noted. Musculoskeletal: No acute or significant osseous findings. IMPRESSION: Findings consistent with infectious or inflammatory right-sided colitis. Aortic Atherosclerosis (ICD10-I70.0). Electronically Signed   By: Marijo Conception M.D.   On: 03/04/2019 15:43   Ct Abdomen Pelvis W Contrast  Result Date: 02/25/2019 CLINICAL DATA:  Rectal bleeding with nausea and vomiting for the past 4 days. History of gastric ulcer and alcohol abuse. EXAM: CT ABDOMEN AND PELVIS WITH CONTRAST TECHNIQUE: Multidetector CT imaging of the abdomen and pelvis was performed using the standard protocol following bolus administration of intravenous contrast. CONTRAST:  133mL OMNIPAQUE IOHEXOL 300 MG/ML  SOLN COMPARISON:  Abdominal ultrasound dated December 23, 2018. CT abdomen pelvis dated January 20, 2013. FINDINGS: Lower chest: No acute abnormality. Hepatobiliary: No focal  liver abnormality is seen. No gallstones, gallbladder wall thickening, or biliary dilatation. Pancreas: Unremarkable. No pancreatic ductal dilatation or surrounding inflammatory changes. Spleen: Normal in size without focal abnormality. Adrenals/Urinary Tract: Adrenal glands are unremarkable. Kidneys are normal, without renal calculi, focal lesion, or hydronephrosis. Bladder is unremarkable for the degree of distention. Stomach/Bowel: Moderate circumferential wall thickening of the cecum and ascending colon with mild surrounding inflammatory stranding. The remaining colon is unremarkable. The stomach and small bowel are within normal limits. No obstruction. Normal appendix. Vascular/Lymphatic: Aortic atherosclerosis. No enlarged abdominal or pelvic lymph nodes. Reproductive: Prostate is unremarkable. Other: Unchanged small fat containing umbilical hernia. No free fluid or pneumoperitoneum. Musculoskeletal: No acute or significant osseous findings. Prominent Schmorl's nodes involving the T10 and T11 superior endplates. IMPRESSION: 1. Acute colitis involving the cecum and ascending colon. 2.  Aortic atherosclerosis (ICD10-I70.0). Electronically Signed   By: Titus Dubin M.D.   On: 02/25/2019 11:52   Ct Angio Abd/pel W/ And/or W/o  Result Date: 03/05/2019 CLINICAL DATA:  Concern for mesenteric ischemia EXAM: CTA ABDOMEN AND PELVIS wITHOUT AND WITH CONTRAST TECHNIQUE: Multidetector CT imaging of the abdomen and pelvis was performed using the standard protocol during bolus administration of intravenous contrast. Multiplanar reconstructed images and MIPs were obtained and reviewed to evaluate the vascular anatomy. CONTRAST:  133mL OMNIPAQUE IOHEXOL 350 MG/ML SOLN COMPARISON:  03/04/2019 FINDINGS: VASCULAR Aorta: Nonaneurysmal and patent. Celiac: Patent.  Branch vessels are patent. SMA: Patent. There is smooth soft and calcified plaque at the origin without significant narrowing. Branch vessels are grossly patent.  Renals: A single right renal artery is patent with atherosclerotic calcified plaque at the origin. There are 2 left renal arteries which are patent. IMA: Patent. Inflow: Bilateral common, internal, and external iliac arteries are patent with scattered calcified atherosclerotic plaque. Proximal Outflow: Grossly patent bilaterally with atherosclerotic plaque. No significant narrowing. Veins: No evidence of DVT. Review of the MIP images confirms the above findings. NON-VASCULAR Lower chest: Dependent atelectasis bilaterally. Hepatobiliary: Unremarkable appearance of the liver. Gallbladder is mildly distended without obvious wall thickening or gallstones. Pancreas: Unremarkable Spleen: Unremarkable Adrenals/Urinary Tract: Adrenal glands are within normal limits. Kidneys are within normal limits in appearance. Bladder is distended. Stomach/Bowel: There is persistent wall thickening involving the ascending colon and hepatic flexure. No evidence of wall thickening or mass in the transverse or descending colon. Sigmoid colon is unremarkable and decompressed. There is no evidence of pneumatosis. There is stranding in the fat adjacent to the ascending colon and hepatic flexure. No extraluminal bowel gas to suggest rupture. No evidence of abscess formation. Small bowel is decompressed.  Stomach is also decompressed. Lymphatic: No abnormal retroperitoneal adenopathy. Reproductive: Prostate is within normal limits. Other: No free-fluid. No free intraperitoneal gas. Postoperative changes from right inguinal herniorrhaphy are noted. Musculoskeletal: T10 and T11 mild wedge compression deformities have a chronic appearance. They are new compared with imaging from 2014. IMPRESSION: VASCULAR No significant narrowing in the visceral vasculature is identified to cause mesenteric ischemia. Atherosclerotic changes are noted. NON-VASCULAR Persistent wall thickening and inflammatory change involving the ascending colon and hepatic flexure.  No evidence of perforation or pneumatosis. Electronically Signed   By: Marybelle Killings M.D.   On: 03/05/2019 15:51  [2 weeks]   Assessment: 68 year old male admitted with GI bleed on Eliquis, status post colonoscopy with extensive ascending colon ulceration concerning for ischemia.  Pathology  now resulted with benign colorectal type mucosa with associated fibrinopurulent material without evidence for IBD, dysplasia, or malignancy.  Differential includes ischemic related changes.  CTA with atherosclerotic changes but no significant stenosis.  C. difficile and GI pathogen panel both negative.  Colchicine may be contributing to loose stools. Patient is aware and can use prn gout flares.   Plan: 1. Lactose free diet, low residue diet at discharge.  2. Continue PPI BID.  3. Continue probiotic.  4. Can continue bentyl at discharge if wants to, one dose caused some mild cramping today.  5. Resume Eliquis 03/16/19.  6. Ok for discharge from GI standpoint. Will arrange for follow up with Dr. Oneida Alar.   Laureen Ochs. Bernarda Caffey [facility] [phone] 6/3/202010:15 AM     LOS: 6 days

## 2019-03-11 ENCOUNTER — Encounter: Payer: Self-pay | Admitting: Gastroenterology

## 2019-03-11 DIAGNOSIS — K922 Gastrointestinal hemorrhage, unspecified: Secondary | ICD-10-CM | POA: Diagnosis not present

## 2019-03-11 NOTE — Telephone Encounter (Signed)
RESCHEDULED HIM TO A 8 WEEK FU WITH [facility] AND SENT HIM A LETTER

## 2019-03-12 DIAGNOSIS — Z712 Person consulting for explanation of examination or test findings: Secondary | ICD-10-CM | POA: Diagnosis not present

## 2019-03-12 DIAGNOSIS — Z136 Encounter for screening for cardiovascular disorders: Secondary | ICD-10-CM | POA: Diagnosis not present

## 2019-03-12 DIAGNOSIS — Z6372 Alcoholism and drug addiction in family: Secondary | ICD-10-CM | POA: Diagnosis not present

## 2019-03-12 DIAGNOSIS — Z0189 Encounter for other specified special examinations: Secondary | ICD-10-CM | POA: Diagnosis not present

## 2019-03-12 DIAGNOSIS — I480 Paroxysmal atrial fibrillation: Secondary | ICD-10-CM | POA: Diagnosis not present

## 2019-03-12 DIAGNOSIS — K2921 Alcoholic gastritis with bleeding: Secondary | ICD-10-CM | POA: Diagnosis not present

## 2019-03-12 DIAGNOSIS — M791 Myalgia, unspecified site: Secondary | ICD-10-CM | POA: Diagnosis not present

## 2019-03-12 DIAGNOSIS — I4819 Other persistent atrial fibrillation: Secondary | ICD-10-CM | POA: Diagnosis not present

## 2019-03-12 DIAGNOSIS — M353 Polymyalgia rheumatica: Secondary | ICD-10-CM | POA: Diagnosis not present

## 2019-03-15 ENCOUNTER — Telehealth: Payer: Self-pay | Admitting: Gastroenterology

## 2019-03-15 NOTE — Telephone Encounter (Signed)
Pt called asking to speak with the nurse or with EG. I told him everyone was at lunch at the moment and I could take a message or transfer call to nurse. He said, No, he would just call back. [phone]

## 2019-03-16 NOTE — Telephone Encounter (Addendum)
Pt was just calling to see when he was supposed to start back on Eliquis, he thought it was June 9th.  I told him that is what was on his paperwork and he found his paperwork and saw it.  He said he is just nervous, because last time he started back on the Eliquis he bleed and lost almost a pint of blood.  He just wants [place] to know he is starting it and if he has any questions he will call.   Forwarding FYI to Walden Field, NP and Roseanne Kaufman, NP.

## 2019-03-16 NOTE — Telephone Encounter (Signed)
Noted  

## 2019-03-17 NOTE — Telephone Encounter (Signed)
Noted  

## 2019-04-05 ENCOUNTER — Telehealth: Payer: Self-pay | Admitting: *Deleted

## 2019-04-05 MED ORDER — APIXABAN 5 MG PO TABS: 5.0000 mg | ORAL_TABLET | Freq: Two times a day (BID) | ORAL | 0 refills | Status: AC

## 2019-04-05 NOTE — Telephone Encounter (Signed)
-----   Message from Alvin Critchley sent at 04/02/2019  4:19 PM EDT ----- Regarding: Samples Patient is requesting Eliquis 5mg  samples.  Will you check on Monday to see if we have any in [place]?  There is none in [place].  I told him I would try [place] and possibly [place] or [place]. Thanks, [employer]

## 2019-04-05 NOTE — Telephone Encounter (Signed)
Pt aware that we had 1 box - will call office when he is in parking lot and will bring out to him

## 2019-05-14 ENCOUNTER — Encounter: Payer: Self-pay | Admitting: Cardiology

## 2019-05-14 DIAGNOSIS — E782 Mixed hyperlipidemia: Secondary | ICD-10-CM | POA: Diagnosis not present

## 2019-05-14 DIAGNOSIS — I480 Paroxysmal atrial fibrillation: Secondary | ICD-10-CM | POA: Diagnosis not present

## 2019-05-19 ENCOUNTER — Ambulatory Visit: Payer: Medicare HMO | Admitting: Gastroenterology

## 2019-06-08 DIAGNOSIS — K2921 Alcoholic gastritis with bleeding: Secondary | ICD-10-CM | POA: Diagnosis not present

## 2019-06-08 DIAGNOSIS — E782 Mixed hyperlipidemia: Secondary | ICD-10-CM | POA: Diagnosis not present

## 2019-06-08 DIAGNOSIS — F1010 Alcohol abuse, uncomplicated: Secondary | ICD-10-CM | POA: Diagnosis not present

## 2019-06-08 DIAGNOSIS — G4700 Insomnia, unspecified: Secondary | ICD-10-CM | POA: Diagnosis not present

## 2019-06-08 DIAGNOSIS — E871 Hypo-osmolality and hyponatremia: Secondary | ICD-10-CM | POA: Diagnosis not present

## 2019-06-08 DIAGNOSIS — I480 Paroxysmal atrial fibrillation: Secondary | ICD-10-CM | POA: Diagnosis not present

## 2019-06-08 DIAGNOSIS — K588 Other irritable bowel syndrome: Secondary | ICD-10-CM | POA: Diagnosis not present

## 2019-06-08 DIAGNOSIS — D500 Iron deficiency anemia secondary to blood loss (chronic): Secondary | ICD-10-CM | POA: Diagnosis not present

## 2019-06-08 DIAGNOSIS — M791 Myalgia, unspecified site: Secondary | ICD-10-CM | POA: Diagnosis not present

## 2019-06-17 ENCOUNTER — Telehealth: Payer: Self-pay | Admitting: Cardiology

## 2019-06-17 NOTE — Telephone Encounter (Signed)
Virtual Visit Pre-Appointment Phone Call  "(Name), I am calling you today to discuss your upcoming appointment. We are currently trying to limit exposure to the virus that causes COVID-19 by seeing patients at home rather than in the office."  1. "What is the BEST phone number to call the day of the visit?" - include this in appointment notes  2. Do you have or have access to (through a family member/friend) a smartphone with video capability that we can use for your visit?" a. If yes - list this number in appt notes as cell (if different from BEST phone #) and list the appointment type as a VIDEO visit in appointment notes b. If no - list the appointment type as a PHONE visit in appointment notes  3. Confirm consent - "In the setting of the current Covid19 crisis, you are scheduled for a (phone or video) visit with your provider on (date) at (time).  Just as we do with many in-office visits, in order for you to participate in this visit, we must obtain consent.  If you'd like, I can send this to your mychart (if signed up) or email for you to review.  Otherwise, I can obtain your verbal consent now.  All virtual visits are billed to your insurance company just like a normal visit would be.  By agreeing to a virtual visit, we'd like you to understand that the technology does not allow for your provider to perform an examination, and thus may limit your provider's ability to fully assess your condition. If your provider identifies any concerns that need to be evaluated in person, we will make arrangements to do so.  Finally, though the technology is pretty good, we cannot assure that it will always work on either your or our end, and in the setting of a video visit, we may have to convert it to a phone-only visit.  In either situation, we cannot ensure that we have a secure connection.  Are you willing to proceed?" STAFF: Did the patient verbally acknowledge consent to telehealth visit? Document  YES/NO here: Yes  4. Advise patient to be prepared - "Two hours prior to your appointment, go ahead and check your blood pressure, pulse, oxygen saturation, and your weight (if you have the equipment to check those) and write them all down. When your visit starts, your provider will ask you for this information. If you have an Apple Watch or Kardia device, please plan to have heart rate information ready on the day of your appointment. Please have a pen and paper handy nearby the day of the visit as well."  5. Give patient instructions for MyChart download to smartphone OR [email or web address] as below if video visit (depending on what platform provider is using)  6. Inform patient they will receive a phone call 15 minutes prior to their appointment time (may be from unknown caller ID) so they should be prepared to answer    TELEPHONE CALL NOTE  Miguel Spencer has been deemed a candidate for a follow-up tele-health visit to limit community exposure during the Covid-19 pandemic. I spoke with the patient via phone to ensure availability of phone/video source, confirm preferred email & phone number, and discuss instructions and expectations.  I reminded Miguel Spencer to be prepared with any vital sign and/or heart rhythm information that could potentially be obtained via home monitoring, at the time of his visit. I reminded Miguel Spencer to expect a phone call prior to  his visit.  [name] 06/17/2019 9:52 AM

## 2019-06-22 ENCOUNTER — Telehealth (INDEPENDENT_AMBULATORY_CARE_PROVIDER_SITE_OTHER): Payer: Medicare HMO | Admitting: Cardiology

## 2019-06-22 ENCOUNTER — Encounter: Payer: Self-pay | Admitting: Cardiology

## 2019-06-22 VITALS — Ht 71.5 in | Wt 223.0 lb

## 2019-06-22 DIAGNOSIS — I480 Paroxysmal atrial fibrillation: Secondary | ICD-10-CM

## 2019-06-22 NOTE — Patient Instructions (Signed)

## 2019-06-22 NOTE — Progress Notes (Signed)
Virtual Visit via Telephone Note   This visit type was conducted due to national recommendations for restrictions regarding the COVID-19 Pandemic (e.g. social distancing) in an effort to limit this patient's exposure and mitigate transmission in our community.  Due to his co-morbid illnesses, this patient is at least at moderate risk for complications without adequate follow up.  This format is felt to be most appropriate for this patient at this time.  The patient did not have access to video technology/had technical difficulties with video requiring transitioning to audio format only (telephone).  All issues noted in this document were discussed and addressed.  No physical exam could be performed with this format.  Please refer to the patient's chart for his  consent to telehealth for [facility].   Date:  06/22/2019   ID:  Miguel Spencer, DOB 06-06-1951, MRN [id number]  Patient Location: Home Provider Location: Office  PCP:  [name], MD  Cardiologist:  Carlyle Dolly, MD  Electrophysiologist:  None   Evaluation Performed:  Follow-Up Visit  Chief Complaint:  Follow up  History of Present Illness:    Miguel Spencer is a 68 y.o. male seen today for follow up of the following medical problems.   1.PAF - admit with afib 08/2018 after not being able to take meds at home after being arrested - started on dilt gtt for afib with RVR. Restarted on his home toprol and dilt  - previously followed in [place], diagnosed with afib around 2017 - Dr Shirl Harris [place], [place]. - issues with insurance with eliquis and xarelto.Had been on coumadin but stopped at time of his GI bleed 09/2018 Holter: PACs, rare runs of atach up to 3 beats. No symptoms reported    - back on eliquis after 02/2019 admission with rectal bleeding - no recurrent bleeding.  - no recent palpitations.   2. EtOH abuse    3. History of GI bleeding - admission to [facility] in [place], [place] with hematemsis and rectal bleeding10/2019 while on coumadin - EGD and colonopscopy showed gastritis, polyp  - upcoming appt with GI.   - admitted 02/2019 with rectal bleeding while on eliquis and indomethacin for gout - 02/2019 colonoscopy concerning for ischemic colitis however CTA was benign. EGD diffuse mild inflammation, duodenal ulcers without bleeding - was to resume eliquis 03/16/19   The patient does not have symptoms concerning for COVID-19 infection (fever, chills, cough, or new shortness of breath).    Past Medical History:  Diagnosis Date  . A-fib ([place])   . A-fib ([place])   . Alcohol abuse   . Alcoholic ([place])   . Anxiety   . Colon polyp   . Depression   . GI bleed   . Gout   . Hypertension   . Vertigo    Past Surgical History:  Procedure Laterality Date  . BIOPSY  02/26/2019   Procedure: BIOPSY;  Surgeon: [name], MD;  [facility];  Service: Endoscopy;;  Gastric   . BIOPSY  03/05/2019   Procedure: BIOPSY;  Surgeon: Daneil Dolin, MD;  [facility];  Service: Endoscopy;;  ascending colon bx  . COLONOSCOPY  07/2018   [place] piecemeal fashion status post tattooing, 30 x 18 mm sized sigmoid colon polyp removed via snare status post tattooing.  Recommended repeat colonoscopy in 3 months.  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  . COLONOSCOPY WITH PROPOFOL N/A 03/05/2019   Procedure: COLONOSCOPY WITH PROPOFOL;  Surgeon: Daneil Dolin, MD;  [facility];  Service: Endoscopy;  Laterality: N/A;  . ESOPHAGOGASTRODUODENOSCOPY (EGD) WITH PROPOFOL N/A 02/26/2019   Dr. Oneida Alar: LA grade D esophagitis, gastritis with benign biopsies, no H. pylori, 5 nonbleeding superficial duodenal ulcers.  Felt to be NSAID related.  Marland Kitchen HERNIA REPAIR    . POLYPECTOMY  03/05/2019   Procedure: POLYPECTOMY;  Surgeon: Daneil Dolin, MD;  [facility];  Service: Endoscopy;;  cold snare polypectomy     Current Meds  Medication Sig  . apixaban (ELIQUIS) 5 MG TABS tablet Take 1 tablet (5 mg total) by mouth 2 (two) times daily.  . B Complex-Biotin-FA (B-COMPLEX PO) Take 1 tablet by mouth daily.  . cyclobenzaprine (FLEXERIL) 10 MG tablet Take 10 mg by mouth at bedtime as needed for muscle spasms (for sleep).   Marland Kitchen dicyclomine (BENTYL) 10 MG capsule Take 1 capsule (10 mg total) by mouth 3 (three) times daily before meals for 30 days.  Marland Kitchen diltiazem (CARDIZEM CD) 180 MG 24 hr capsule Take 1 capsule (180 mg total) by mouth daily.  Marland Kitchen LORazepam (ATIVAN) 0.5 MG tablet Take 3 tablets (1.5 mg total) by mouth at bedtime as needed for anxiety.  Marland Kitchen LYSINE PO Take 1 capsule by mouth daily.  . metoprolol succinate (TOPROL-XL) 100 MG 24 hr tablet Take 1 tablet (100 mg total) by mouth daily. Take with or immediately following a meal.  . Omega-3 Fatty Acids (FISH OIL PO) Take 1 capsule by mouth daily.  . pantoprazole (PROTONIX) 40 MG tablet Take 40 mg by mouth daily.  . sildenafil (VIAGRA) 100 MG tablet Take 100 mg by mouth daily as needed for erectile dysfunction.  . valACYclovir (VALTREX) 1000 MG tablet Take 1,000 mg by mouth daily as needed (cold sores).   . [DISCONTINUED] colchicine 0.6 MG tablet Take 1 tablet (0.6 mg total) by mouth daily for 30 days. Hold if experiencing diarrhea.  . [DISCONTINUED] HYDROcodone-acetaminophen (NORCO/VICODIN) 5-325 MG tablet Take 1 tablet by mouth every 6 (six) hours as needed for severe pain.  . [DISCONTINUED] pantoprazole (PROTONIX) 40 MG tablet Take 1 tablet (40 mg total) by mouth 2 (two) times daily before a meal for 30 days. (Patient taking differently: Take 40 mg by mouth daily. )     Allergies:   Patient has no known allergies.   Social History   Tobacco Use  . Smoking status: Never Smoker  . Smokeless tobacco: Never Used  Substance Use Topics  . Alcohol use: Not Currently     Comment: No ETOH 6-8 weeks (as of 02/15/19). "alcoholic all my life"; Currently drinks all day for 7 days straight 1-2 times (weeks) a month  . Drug use: No     Family Hx: The patient's family history includes Cancer in his mother; Heart disease in his brother, father, and another family member. There is no history of Colon cancer, Gastric cancer, or Esophageal cancer.  ROS:   Please see the history of present illness.    All other systems reviewed and are negative.   Prior CV studies:   The following studies were reviewed today:  08/2018 echo Study Conclusions  - Left ventricle: The cavity size was normal. Wall thickness was increased in a pattern of mild LVH. Systolic function was normal. The estimated ejection fraction was 50%. Diffuse hypokinesis. The study was not technically sufficient to allow evaluation  of LV diastolic dysfunction due to atrial fibrillation. - Aortic valve: Mildly calcified annulus. Trileaflet; mildly calcified leaflets. - Aortic root: The aortic root was mildly dilated. - Mitral valve: Mildly calcified annulus. There was mild to moderate regurgitation. - Left atrium: The atrium was moderately dilated. - Right atrium: Central venous pressure (est): 15 mm Hg. - Atrial septum: No defect or patent foramen ovale was identified. - Tricuspid valve: There was mild regurgitation. - Pulmonary arteries: PA peak pressure: 46 mm Hg (S). - Pericardium, extracardiac: A trivial pericardial effusion was identified posterior to the heart.   09/2018 Holter: PACs, rare runs of atach up to 3 beats. No symptoms  Labs/Other Tests and Data Reviewed:    EKG:   Today's Vitals   06/22/19 1221  Weight: 223 lb (101.2 kg)  Height: 5' 11.5" (1.816 m)   Body mass index is 30.67 kg/m.   Recent Labs: 08/14/2018: TSH 2.078 03/07/2019: Magnesium 2.0 03/08/2019: ALT 29; BUN 6; Creatinine, Ser 1.08; Potassium 3.8; Sodium 137 03/09/2019: Hemoglobin 8.6; Platelets  362   Recent Lipid Panel No results found for: CHOL, TRIG, HDL, CHOLHDL, LDLCALC, LDLDIRECT  Wt Readings from Last 3 Encounters:  06/22/19 223 lb (101.2 kg)  03/10/19 187 lb (84.8 kg)  02/27/19 210 lb 1.6 oz (95.3 kg)     Objective:    Vital Signs:  Ht 5' 11.5" (1.816 m)   Wt 223 lb (101.2 kg)   BMI 30.67 kg/m    Normal affect, normal speech pattern and tone. Comfortable, no apparent distress. No audible signs of sob or wheezing.   ASSESSMENT & PLAN:    1.PAF -no recent symptoms, doing well. Continue current meds, no recent bleeding on anticoag.      COVID-19 Education: The signs and symptoms of COVID-19 were discussed with the patient and how to seek care for testing (follow up with PCP or arrange E-visit).  The importance of social distancing was discussed today.  Time:   Today, I have spent 12 minutes with the patient with telehealth technology discussing the above problems.     Medication Adjustments/Labs and Tests Ordered: Current medicines are reviewed at length with the patient today.  Concerns regarding medicines are outlined above.   Tests Ordered: No orders of the defined types were placed in this encounter.   Medication Changes: No orders of the defined types were placed in this encounter.   Follow Up:  In Person in 6 month(s)  Signed, Carlyle Dolly, MD  06/22/2019 12:35 PM    [place]

## 2019-06-29 ENCOUNTER — Ambulatory Visit: Payer: Medicare HMO | Admitting: Nurse Practitioner

## 2019-07-07 ENCOUNTER — Ambulatory Visit (INDEPENDENT_AMBULATORY_CARE_PROVIDER_SITE_OTHER): Payer: Medicare HMO | Admitting: Gastroenterology

## 2019-07-07 ENCOUNTER — Encounter: Payer: Self-pay | Admitting: Gastroenterology

## 2019-07-07 ENCOUNTER — Other Ambulatory Visit: Payer: Self-pay

## 2019-07-07 DIAGNOSIS — K7030 Alcoholic cirrhosis of liver without ascites: Secondary | ICD-10-CM

## 2019-07-07 NOTE — Patient Instructions (Addendum)
Congratulations!  EAT TO LIVE AND THINK OF FOOD AS MEDICINE. 75% OF YOUR PLATE SHOULD BE FRUITS/VEGGIES.  To have more energy, and to lose weight:      1. CONTINUE YOUR WEIGHT LOSS EFFORTS. I RECOMMEND YOU READ AND FOLLOW RECOMMENDATIONS BY DR. Kasra HYMAN, "10-DAY DETOX DIET".    2. If you must eat bread, EAT EZEKIEL BREAD. IT IS IN THE FROZEN SECTION OF THE GROCERY STORE.    3. DRINK WATER WITH FRUIT OR CUCUMBER ADDED. YOUR URINE LIGHT SHOULD BE LIGHT YELLOW. AVOID SODA, GATORADE, ENERGY DRINKS, OR DIET SODA.     4. AVOID HIGH FRUCTOSE CORN SYRUP.     5. DO NOT chew SUGAR FREE GUM OR USE ARTIFICIAL SWEETENERS. IF NEEDED USE STEVIA AS A SWEETENER.    6. DO NOT EAT ENRICHED WHEAT FLOUR, PASTA, RICE, OR CEREAL.    7. ONLY EAT WILD CAUGHT SEAFOOD, GRASS FED BEEF OR CHICKEN, PORK FROM PASTURE RAISE PIGS, OR EGGS FROM PASTURE RAISED CHICKENS.    8. PRACTICE CHAIR YOGA FOR 15-30 MINS 3 OR 4 TIMES A WEEK AND PROGRESS TO HATHA YOGA OVER NEXT 6 MOS.    9. START TAKING A VITAMIN B12, AND VITAMIN D3 2000 IU DAILY.   ADDITIONAL SUPPLEMENTS TO DECREASE CRAVING, PROMOTE WEIGHT LOSS, AND SUPPRESS YOUR APPETITE:    1. CINNAMON 500 MG EVERY AM PRIOR TO FIRST MEAL    2. CHROMIUM 400-500 MG WITH MEALS TWICE DAILY    3. GREEN TEA EXTRACT ONE DAILY.   CONTINUE PROTONIX. TAKE 30 MINUTES PRIOR TO BREAKFAST.   FOLLOW UP IN 6 MOS. I SPOKE WITH RADIOLOGY AND THEY RECOMMENDED A REPEAT ULTRASOUND IN DEC 2020 AND IF IT SHOWS SCARRING THEN WE SHOULD SCREEN YOUR LIVER EVERY 6 MOS.

## 2019-07-07 NOTE — Assessment & Plan Note (Signed)
ELASTOGRAPHY SHOWS FIBROSIS. CT MAY 2020: NO CIRRHOSIS. BMI 30.   DISCUSSED BENEFITS, AND RISKS BMI > 30-WORSENING CIRRHOSIS. Congratulations!  EAT TO LIVE AND THINK OF FOOD AS MEDICINE. 75% OF YOUR PLATE SHOULD BE FRUITS/VEGGIES.  To have more energy, and to lose weight:     1. CONTINUE YOUR WEIGHT LOSS EFFORTS. I RECOMMEND YOU READ AND FOLLOW RECOMMENDATIONS BY DR. Artice HYMAN, "10-DAY DETOX DIET".    2. If you must eat bread, EAT EZEKIEL BREAD. IT IS IN THE FROZEN SECTION OF THE GROCERY STORE.   3. DRINK WATER WITH FRUIT OR CUCUMBER ADDED. YOUR URINE LIGHT SHOULD BE LIGHT YELLOW. AVOID SODA, GATORADE, ENERGY DRINKS, OR DIET SODA.    4. AVOID HIGH FRUCTOSE CORN SYRUP.    5. DO NOT chew SUGAR FREE GUM OR USE ARTIFICIAL SWEETENERS. IF NEEDED USE STEVIA AS A SWEETENER.   6. DO NOT EAT ENRICHED WHEAT FLOUR, PASTA, RICE, OR CEREAL.   7. ONLY EAT WILD CAUGHT SEAFOOD, GRASS FED BEEF OR CHICKEN, PORK FROM PASTURE RAISE PIGS, OR EGGS FROM PASTURE RAISED CHICKENS.   8. PRACTICE CHAIR YOGA FOR 15-30 MINS 3 OR 4 TIMES A WEEK AND PROGRESS TO HATHA YOGA OVER NEXT 6 MOS.  HANDOUT GIVEN.   9. START TAKING A VITAMIN B12, AND VITAMIN D3 2000 IU DAILY.   ADDITIONAL SUPPLEMENTS TO DECREASE CRAVING, PROMOTE WEIGHT LOSS, AND SUPPRESS YOUR APPETITE:   1. CINNAMON 500 MG EVERY AM PRIOR TO FIRST MEAL   2. CHROMIUM 400-500 MG WITH MEALS TWICE DAILY   3. GREEN TEA EXTRACT ONE DAILY.   FOLLOW UP IN 6 MOS. I PERSONALLY REVIEWED THE [place]: MAR 2020 AND CT: MAY 2020 WITH DR. Thornton Papas. HE RECOMMENDED A REPEAT ULTRASOUND/ELASTOGRAPHY IN DEC 2020 AND IF IT SHOWS SCARRING THEN WE SHOULD CONTINUE LIVER SURVEILLANCEEVERY 6 MOS.

## 2019-07-07 NOTE — Progress Notes (Signed)
Subjective:    Patient ID: Miguel Spencer, male    DOB: 07/16/1951, 68 y.o.   MRN: [id number]  [name], MD  HPI STOPPED DRINKING 2 MOS AGO. DOESN'T DRIVE ANYMORE. NO GOOD BARS IN [place]. MAR 2020: 203 LBS. 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 BMs: DAILY, NL. SOB ONCE IN A WHILE.   PT DENIES FEVER, CHILLS, HEMATOCHEZIA, HEMATEMESIS, nausea, vomiting, melena, diarrhea, CHEST PAIN, SHORTNESS OF BREATH, CHANGE IN BOWEL IN HABITS, constipation, abdominal pain, problems swallowing, problems with sedation, OR heartburn or indigestion.  Past Medical History:  Diagnosis Date  . A-fib ([place])   . A-fib ([place])   . Alcohol abuse   . Alcoholic ([place])   . Anxiety   . Colon polyp   . Depression   . GI bleed   . Gout   . Hypertension   . Vertigo    Past Surgical History:  Procedure Laterality Date  . BIOPSY  02/26/2019   Procedure: BIOPSY;  Surgeon: [name], MD;  [facility];  Service: Endoscopy;;  Gastric   . BIOPSY  03/05/2019   Procedure: BIOPSY;  Surgeon: Daneil Dolin, MD;  [facility];  Service: Endoscopy;;  ascending colon bx  . COLONOSCOPY  07/2018   [place] piecemeal fashion status post tattooing, 30 x 18 mm sized sigmoid colon polyp removed via snare status post tattooing.  Recommended repeat colonoscopy in 3 months.  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  . COLONOSCOPY WITH PROPOFOL N/A 03/05/2019   Procedure: COLONOSCOPY WITH PROPOFOL;  Surgeon: Daneil Dolin, MD;  [facility];  Service: Endoscopy;  Laterality: N/A;  . ESOPHAGOGASTRODUODENOSCOPY (EGD) WITH PROPOFOL N/A 02/26/2019   Dr. Oneida Alar: LA grade D esophagitis, gastritis with benign biopsies, no H. pylori, 5 nonbleeding superficial duodenal ulcers.  Felt to be NSAID related.  Marland Kitchen HERNIA REPAIR    . POLYPECTOMY   03/05/2019   Procedure: POLYPECTOMY;  Surgeon: Daneil Dolin, MD;  [facility];  Service: Endoscopy;;  cold snare polypectomy   No Known Allergies  Current Outpatient Medications  Medication Sig    . apixaban (ELIQUIS) 5 MG TABS tablet Take 1 tablet (5 mg total) by mouth 2 (two) times daily.    . B Complex-Biotin-FA (B-COMPLEX PO) Take 1 tablet by mouth daily.    . colchicine 0.6 MG tablet Take 0.6 mg by mouth as needed.    . cyclobenzaprine (FLEXERIL) 10 MG tablet Take 10 mg by mouth at bedtime as needed for muscle spasms (for sleep).     Marland Kitchen dicyclomine (BENTYL) 10 MG capsule Take 1 capsule (10 mg total) by mouth 3 (three) times daily before meals for 30 days.    Marland Kitchen diltiazem (CARDIZEM CD) 180 MG 24 hr capsule Take 1 capsule (180 mg total) by mouth daily. (Patient taking differently: Take 240 mg by mouth daily. )    . INDOMETHACIN PO Take by mouth as needed.    Marland Kitchen LORazepam (ATIVAN) 0.5 MG tablet Take 3 tablets (1.5 mg total) by mouth at bedtime as needed for anxiety.    Marland Kitchen LYSINE PO Take 1 capsule by mouth daily.    . metoprolol succinate (TOPROL-XL) 100 MG 24 hr tablet Take 1 tablet (100 mg total) by mouth daily. Take with or immediately following a meal.    . Omega-3 Fatty Acids (FISH OIL  PO) Take 1 capsule by mouth daily.    . pantoprazole (PROTONIX) 40 MG tablet Take 40 mg by mouth daily.    . sildenafil (VIAGRA) 100 MG tablet Take 100 mg by mouth daily as needed for erectile dysfunction.    . valACYclovir (VALTREX) 1000 MG tablet Take 1,000 mg by mouth daily as needed (cold sores).       Review of Systems PER HPI OTHERWISE ALL SYSTEMS ARE NEGATIVE.    Objective:   Physical Exam Vitals signs reviewed.  Constitutional:      General: He is not in acute distress.    Appearance: He is well-developed.  HENT:     Head: Normocephalic and atraumatic.     Mouth/Throat:     Comments: MASK IN PLACE Eyes:     General: No scleral icterus.    Pupils: Pupils are equal, round, and  reactive to light.  Neck:     Musculoskeletal: Normal range of motion and neck supple.  Cardiovascular:     Rate and Rhythm: Normal rate and regular rhythm.     Heart sounds: Normal heart sounds.  Pulmonary:     Effort: Pulmonary effort is normal. No respiratory distress.     Breath sounds: Normal breath sounds.  Abdominal:     General: Bowel sounds are normal. There is no distension.     Palpations: Abdomen is soft.     Tenderness: There is no abdominal tenderness.  Musculoskeletal:     Right lower leg: No edema.     Left lower leg: No edema.  Lymphadenopathy:     Cervical: No cervical adenopathy.  Skin:    General: Skin is warm and dry.  Neurological:     General: No focal deficit present.     Mental Status: He is alert and oriented to person, place, and time.  Psychiatric:        Mood and Affect: Mood normal.        Thought Content: Thought content normal.        Judgment: Judgment normal.       Assessment & Plan:

## 2019-08-23 ENCOUNTER — Telehealth: Payer: Self-pay | Admitting: Gastroenterology

## 2019-08-23 DIAGNOSIS — K7030 Alcoholic cirrhosis of liver without ascites: Secondary | ICD-10-CM

## 2019-08-23 NOTE — Telephone Encounter (Signed)
NEED RUQ [place] IN [employer] 2020.

## 2019-08-23 NOTE — Telephone Encounter (Signed)
Pt called to schedule his U/S of Liver. [phone]

## 2019-08-23 NOTE — Telephone Encounter (Signed)
[place] scheduled for 12/18 at 9:30am, arrival time 9:15am, npo midnight.  Patient aware of appt details. Letter mailed.

## 2019-08-23 NOTE — Telephone Encounter (Signed)
Patient was advised to have [place] in December 2020 from last OV. Please advise Dr. Oneida Alar if patient needs RUQ or complete? Thanks

## 2019-09-24 ENCOUNTER — Ambulatory Visit (HOSPITAL_COMMUNITY): Payer: Medicare HMO

## 2019-10-11 ENCOUNTER — Ambulatory Visit (HOSPITAL_COMMUNITY): Payer: Medicare HMO

## 2019-10-14 DIAGNOSIS — D500 Iron deficiency anemia secondary to blood loss (chronic): Secondary | ICD-10-CM | POA: Diagnosis not present

## 2019-10-14 DIAGNOSIS — E782 Mixed hyperlipidemia: Secondary | ICD-10-CM | POA: Diagnosis not present

## 2019-10-14 DIAGNOSIS — D649 Anemia, unspecified: Secondary | ICD-10-CM | POA: Diagnosis not present

## 2019-10-19 DIAGNOSIS — G4700 Insomnia, unspecified: Secondary | ICD-10-CM | POA: Diagnosis not present

## 2019-10-19 DIAGNOSIS — I480 Paroxysmal atrial fibrillation: Secondary | ICD-10-CM | POA: Diagnosis not present

## 2019-10-19 DIAGNOSIS — K588 Other irritable bowel syndrome: Secondary | ICD-10-CM | POA: Diagnosis not present

## 2019-10-19 DIAGNOSIS — K2921 Alcoholic gastritis with bleeding: Secondary | ICD-10-CM | POA: Diagnosis not present

## 2019-10-19 DIAGNOSIS — D500 Iron deficiency anemia secondary to blood loss (chronic): Secondary | ICD-10-CM | POA: Diagnosis not present

## 2019-10-19 DIAGNOSIS — M791 Myalgia, unspecified site: Secondary | ICD-10-CM | POA: Diagnosis not present

## 2019-10-19 DIAGNOSIS — E782 Mixed hyperlipidemia: Secondary | ICD-10-CM | POA: Diagnosis not present

## 2019-10-19 DIAGNOSIS — F1010 Alcohol abuse, uncomplicated: Secondary | ICD-10-CM | POA: Diagnosis not present

## 2019-10-19 DIAGNOSIS — E871 Hypo-osmolality and hyponatremia: Secondary | ICD-10-CM | POA: Diagnosis not present

## 2019-10-25 ENCOUNTER — Ambulatory Visit (HOSPITAL_COMMUNITY): Payer: Medicare HMO

## 2019-11-24 ENCOUNTER — Ambulatory Visit (HOSPITAL_COMMUNITY): Payer: Medicare HMO

## 2019-12-23 ENCOUNTER — Ambulatory Visit (INDEPENDENT_AMBULATORY_CARE_PROVIDER_SITE_OTHER): Payer: Medicare HMO | Admitting: Cardiology

## 2019-12-23 ENCOUNTER — Encounter: Payer: Self-pay | Admitting: Cardiology

## 2019-12-23 VITALS — BP 123/78 | HR 50 | Temp 99.1°F | Ht 72.0 in | Wt 224.0 lb

## 2019-12-23 DIAGNOSIS — I340 Nonrheumatic mitral (valve) insufficiency: Secondary | ICD-10-CM

## 2019-12-23 DIAGNOSIS — I480 Paroxysmal atrial fibrillation: Secondary | ICD-10-CM | POA: Diagnosis not present

## 2019-12-23 NOTE — Progress Notes (Signed)
Clinical Summary Mr. Hannasch is a 69 y.o.male seen today for follow up of the following medical problems.  1.PAF - admit with afib 08/2018 after not being able to take meds at home after being arrested - started on dilt gtt for afib with RVR. Restarted on his home toprol and dilt  - previously followed in [place], diagnosed with afib around 2017 - Dr Shirl Harris [place], [place]. - issues with insurance with eliquis and xarelto.Had been on coumadin but stopped at time of his GI bleed 09/2018 Holter: PACs, rare runs of atach up to 3 beats. No symptomsreported   - no palpitations when compliant with meds, will have symptoms if misses meds - small amount of blood in urine 2 weeks ago, lasted 1 day  2. EtOH abuse/Cirrhosis - followed by GI    3. History of GI bleeding - admission to [facility] in [place], [place] with hematemsis and rectal bleeding10/2019while on coumadin -EGD and colonopscopy showed gastritis, polyp - admitted 02/2019 with rectal bleeding while on eliquis and indomethacin for gout - 02/2019 colonoscopy concerning for ischemic colitis however CTA was benign. EGD diffuse mild inflammation, duodenal ulcers without bleeding. was to resume eliquis 03/16/19   4. Mitral regurgitation - mild to mod by 2019 echo - no recent symptoms    Had first covid vaccine, awaiting second one.   Past Medical History:  Diagnosis Date  . A-fib ([place])   . A-fib ([place])   . Alcohol abuse   . Alcoholic ([place])   . Anxiety   . Colon polyp   . Depression   . GI bleed   . Gout   . Hypertension   . Vertigo      No Known Allergies   Current Outpatient Medications  Medication Sig Dispense Refill  . apixaban (ELIQUIS) 5 MG TABS tablet Take 1 tablet (5 mg total) by mouth 2 (two) times daily. 14 tablet 0  . B Complex-Biotin-FA (B-COMPLEX PO) Take 1 tablet by mouth daily.    . colchicine 0.6 MG tablet Take 0.6 mg by mouth as needed.    .  cyclobenzaprine (FLEXERIL) 10 MG tablet Take 10 mg by mouth at bedtime as needed for muscle spasms (for sleep).     Marland Kitchen dicyclomine (BENTYL) 10 MG capsule Take 1 capsule (10 mg total) by mouth 3 (three) times daily before meals for 30 days. 90 capsule 2  . diltiazem (CARDIZEM CD) 180 MG 24 hr capsule Take 1 capsule (180 mg total) by mouth daily. (Patient taking differently: Take 240 mg by mouth daily. ) 30 capsule 1  . INDOMETHACIN PO Take by mouth as needed.    Marland Kitchen LORazepam (ATIVAN) 0.5 MG tablet Take 3 tablets (1.5 mg total) by mouth at bedtime as needed for anxiety. 10 tablet 0  . LYSINE PO Take 1 capsule by mouth daily.    . metoprolol succinate (TOPROL-XL) 100 MG 24 hr tablet Take 1 tablet (100 mg total) by mouth daily. Take with or immediately following a meal. 30 tablet 1  . Omega-3 Fatty Acids (FISH OIL PO) Take 1 capsule by mouth daily.    . pantoprazole (PROTONIX) 40 MG tablet Take 40 mg by mouth daily.    . sildenafil (VIAGRA) 100 MG tablet Take 100 mg by mouth daily as needed for erectile dysfunction.    . valACYclovir (VALTREX) 1000 MG tablet Take 1,000 mg by mouth daily as needed (cold sores).      No current facility-administered medications for  this visit.     Past Surgical History:  Procedure Laterality Date  . BIOPSY  02/26/2019   Procedure: BIOPSY;  Surgeon: [name], MD;  [facility];  Service: Endoscopy;;  Gastric   . BIOPSY  03/05/2019   Procedure: BIOPSY;  Surgeon: Daneil Dolin, MD;  [facility];  Service: Endoscopy;;  ascending colon bx  . COLONOSCOPY  07/2018   [place] piecemeal fashion status post tattooing, 30 x 18 mm sized sigmoid colon polyp removed via snare status post tattooing.  Recommended repeat colonoscopy in 3 months.  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  . COLONOSCOPY WITH PROPOFOL N/A 03/05/2019    Procedure: COLONOSCOPY WITH PROPOFOL;  Surgeon: Daneil Dolin, MD;  [facility];  Service: Endoscopy;  Laterality: N/A;  . ESOPHAGOGASTRODUODENOSCOPY (EGD) WITH PROPOFOL N/A 02/26/2019   Dr. Oneida Alar: LA grade D esophagitis, gastritis with benign biopsies, no H. pylori, 5 nonbleeding superficial duodenal ulcers.  Felt to be NSAID related.  Marland Kitchen HERNIA REPAIR    . POLYPECTOMY  03/05/2019   Procedure: POLYPECTOMY;  Surgeon: Daneil Dolin, MD;  [facility];  Service: Endoscopy;;  cold snare polypectomy     No Known Allergies    Family History  Problem Relation Age of Onset  . Cancer Mother   . Heart disease Father   . Heart disease Brother   . Heart disease Other   . Colon cancer Neg Hx   . Gastric cancer Neg Hx   . Esophageal cancer Neg Hx      Social History Mr. Bhatnagar reports that he has never smoked. He has never used smokeless tobacco. Mr. Nuckolls reports previous alcohol use.   Review of Systems CONSTITUTIONAL: No weight loss, fever, chills, weakness or fatigue.  HEENT: Eyes: No visual loss, blurred vision, double vision or yellow sclerae.No hearing loss, sneezing, congestion, runny nose or sore throat.  SKIN: No rash or itching.  CARDIOVASCULAR: per hpi RESPIRATORY: No shortness of breath, cough or sputum.  GASTROINTESTINAL: No anorexia, nausea, vomiting or diarrhea. No abdominal pain or blood.  GENITOURINARY: No burning on urination, no polyuria NEUROLOGICAL: No headache, dizziness, syncope, paralysis, ataxia, numbness or tingling in the extremities. No change in bowel or bladder control.  MUSCULOSKELETAL: No muscle, back pain, joint pain or stiffness.  LYMPHATICS: No enlarged nodes. No history of splenectomy.  PSYCHIATRIC: No history of depression or anxiety.  ENDOCRINOLOGIC: No reports of sweating, cold or heat intolerance. No polyuria or polydipsia.  Marland Kitchen   Physical Examination Today's Vitals   12/23/19 1523  BP: 123/78  Pulse: (!) 50    Temp: 99.1 F (37.3 C)  SpO2: 98%  Weight: 224 lb (101.6 kg)  Height: 6' (1.829 m)   Body mass index is 30.38 kg/m.  Gen: resting comfortably, no acute distress HEENT: no scleral icterus, pupils equal round and reactive, no palptable cervical adenopathy,  CV: RRR, no m/r/g, no jvd Resp: Clear to auscultation bilaterally GI: abdomen is soft, non-tender, non-distended, normal bowel sounds, no hepatosplenomegaly MSK: extremities are warm, no edema.  Skin: warm, no rash Neuro:  no focal deficits Psych: appropriate affect   Diagnostic Studies 08/2018 echo Study Conclusions  - Left ventricle: The cavity size was normal. Wall thickness was increased in a pattern of mild LVH. Systolic function was normal. The estimated ejection fraction was 50%. Diffuse hypokinesis. The study was not technically  sufficient to allow evaluation of LV diastolic dysfunction due to atrial fibrillation. - Aortic valve: Mildly calcified annulus. Trileaflet; mildly calcified leaflets. - Aortic root: The aortic root was mildly dilated. - Mitral valve: Mildly calcified annulus. There was mild to moderate regurgitation. - Left atrium: The atrium was moderately dilated. - Right atrium: Central venous pressure (est): 15 mm Hg. - Atrial septum: No defect or patent foramen ovale was identified. - Tricuspid valve: There was mild regurgitation. - Pulmonary arteries: PA peak pressure: 46 mm Hg (S). - Pericardium, extracardiac: A trivial pericardial effusion was identified posterior to the heart.   09/2018 Holter: PACs, rare runs of atach up to 3 beats. No symptoms    Assessment and Plan   1.PAF - denies any recent symptoms, continue current meds - no recent bleeding, continue eliquis  2. Mitral regurgitation -mild to moderate by 2019 echo - continue to monitor, repeat echo 2022  F/u 6 months   Arnoldo Lenis, M.D

## 2019-12-23 NOTE — Patient Instructions (Signed)

## 2019-12-27 DIAGNOSIS — E669 Obesity, unspecified: Secondary | ICD-10-CM | POA: Diagnosis not present

## 2019-12-27 DIAGNOSIS — I480 Paroxysmal atrial fibrillation: Secondary | ICD-10-CM | POA: Diagnosis not present

## 2019-12-27 DIAGNOSIS — F1010 Alcohol abuse, uncomplicated: Secondary | ICD-10-CM | POA: Diagnosis not present

## 2019-12-27 DIAGNOSIS — K429 Umbilical hernia without obstruction or gangrene: Secondary | ICD-10-CM | POA: Diagnosis not present

## 2020-01-20 ENCOUNTER — Encounter: Payer: Self-pay | Admitting: General Surgery

## 2020-01-20 ENCOUNTER — Ambulatory Visit (INDEPENDENT_AMBULATORY_CARE_PROVIDER_SITE_OTHER): Payer: Medicare HMO | Admitting: General Surgery

## 2020-01-20 ENCOUNTER — Other Ambulatory Visit: Payer: Self-pay

## 2020-01-20 VITALS — BP 121/81 | HR 57 | Temp 97.4°F | Resp 12 | Ht 72.0 in | Wt 225.0 lb

## 2020-01-20 DIAGNOSIS — K429 Umbilical hernia without obstruction or gangrene: Secondary | ICD-10-CM

## 2020-01-20 NOTE — Patient Instructions (Signed)
Do not take Eliquis on 4/24 or 4/25 or on the day of surgery.    Umbilical Hernia, Adult  A hernia is a bulge of tissue that pushes through an opening between muscles. An umbilical hernia happens in the abdomen, near the belly button (umbilicus). The hernia may contain tissues from the small intestine, large intestine, or fatty tissue covering the intestines (omentum). Umbilical hernias in adults tend to get worse over time, and they require surgical treatment. There are several types of umbilical hernias. You may have:  A hernia located just above or below the umbilicus (indirect hernia). This is the most common type of umbilical hernia in adults.  A hernia that forms through an opening formed by the umbilicus (direct hernia).  A hernia that comes and goes (reducible hernia). A reducible hernia may be visible only when you strain, lift something heavy, or cough. This type of hernia can be pushed back into the abdomen (reduced).  A hernia that traps abdominal tissue inside the hernia (incarcerated hernia). This type of hernia cannot be reduced.  A hernia that cuts off blood flow to the tissues inside the hernia (strangulated hernia). The tissues can start to die if this happens. This type of hernia requires emergency treatment. What are the causes? An umbilical hernia happens when tissue inside the abdomen presses on a weak area of the abdominal muscles. What increases the risk? You may have a greater risk of this condition if you:  Are obese.  Have had several pregnancies.  Have a buildup of fluid inside your abdomen (ascites).  Have had surgery that weakens the abdominal muscles. What are the signs or symptoms? The main symptom of this condition is a painless bulge at or near the belly button. A reducible hernia may be visible only when you strain, lift something heavy, or cough. Other symptoms may include:  Dull pain.  A feeling of pressure. Symptoms of a strangulated hernia may  include:  Pain that gets increasingly worse.  Nausea and vomiting.  Pain when pressing on the hernia.  Skin over the hernia becoming red or purple.  Constipation.  Blood in the stool. How is this diagnosed? This condition may be diagnosed based on:  A physical exam. You may be asked to cough or strain while standing. These actions increase the pressure inside your abdomen and force the hernia through the opening in your muscles. Your health care provider may try to reduce the hernia by pressing on it.  Your symptoms and medical history. How is this treated? Surgery is the only treatment for an umbilical hernia. Surgery for a strangulated hernia is done as soon as possible. If you have a small hernia that is not incarcerated, you may need to lose weight before having surgery. Follow these instructions at home:  Lose weight, if told by your health care provider.  Do not try to push the hernia back in.  Watch your hernia for any changes in color or size. Tell your health care provider if any changes occur.  You may need to avoid activities that increase pressure on your hernia.  Do not lift anything that is heavier than 10 lb (4.5 kg) until your health care provider says that this is safe.  Take over-the-counter and prescription medicines only as told by your health care provider.  Keep all follow-up visits as told by your health care provider. This is important. Contact a health care provider if:  Your hernia gets larger.  Your hernia becomes painful.  Get help right away if:  You develop sudden, severe pain near the area of your hernia.  You have pain as well as nausea or vomiting.  You have pain and the skin over your hernia changes color.  You develop a fever. This information is not intended to replace advice given to you by your health care provider. Make sure you discuss any questions you have with your health care provider. Document Revised: 11/05/2017 Document  Reviewed: 03/24/2017 Elsevier Patient Education  [place] Repair, Adult  Open hernia repair is a surgical procedure to fix a hernia. A hernia occurs when an internal organ or tissue pushes out through a weak spot in the abdominal wall muscles. Hernias commonly occur in the groin and around the navel. Most hernias tend to get worse over time. Often, surgery is done to prevent the hernia from becoming bigger, uncomfortable, or an emergency. Emergency surgery may be needed if abdominal contents get stuck in the opening (incarcerated hernia) or the blood supply gets cut off (strangulated hernia). In an open repair, an incision is made in the abdomen to perform the surgery. Tell a health care provider about:  Any allergies you have.  All medicines you are taking, including vitamins, herbs, eye drops, creams, and over-the-counter medicines.  Any problems you or family members have had with anesthetic medicines.  Any blood or bone disorders you have.  Any surgeries you have had.  Any medical conditions you have, including any recent cold or flu symptoms.  Whether you are pregnant or may be pregnant. What are the risks? Generally, this is a safe procedure. However, problems may occur, including:  Long-lasting (chronic) pain.  Bleeding.  Infection.  Damage to the testicle. This can cause shrinking or swelling.  Damage to the bladder, blood vessels, intestine, or nerves near the hernia.  Trouble passing urine.  Allergic reactions to medicines.  Return of the hernia. Medicines  Ask your health care provider about: ? Changing or stopping your regular medicines. This is especially important if you are taking diabetes medicines or blood thinners. ? Taking medicines such as aspirin and ibuprofen. These medicines can thin your blood. Do not take these medicines before your procedure if your health care provider instructs you not to.  You may be given antibiotic  medicine to help prevent infection. General instructions  You may have blood tests or imaging studies.  Ask your health care provider how your surgical site will be marked or identified.  If you smoke, do not smoke for at least 2 weeks before your procedure or for as long as told by your health care provider.  Let your health care provider know if you develop a cold or any infection before your surgery.  Plan to have someone take you home from the hospital or clinic.  If you will be going home right after the procedure, plan to have someone with you for 24 hours. What happens during the procedure?  To reduce your risk of infection: ? Your health care team will wash or sanitize their hands. ? Your skin will be washed with soap. ? Hair may be removed from the surgical area.  An IV tube will be inserted into one of your veins.  You will be given one or more of the following: ? A medicine to help you relax (sedative). ? A medicine to numb the area (local anesthetic). ? A medicine to make you fall asleep (general anesthetic).  Your surgeon will  make an incision over the hernia.  The tissues of the hernia will be moved back into place.  The edges of the hernia may be stitched together.  The opening in the abdominal muscles will be closed with stitches (sutures). Or, your surgeon will place a mesh patch made of manmade (synthetic) material over the opening.  The incision will be closed.  A bandage (dressing) may be placed over the incision. The procedure may vary among health care providers and hospitals. What happens after the procedure?  Your blood pressure, heart rate, breathing rate, and blood oxygen level will be monitored until the medicines you were given have worn off.  You may be given medicine for pain.  Do not drive for 24 hours if you received a sedative. This information is not intended to replace advice given to you by your health care provider. Make sure you  discuss any questions you have with your health care provider. Document Revised: 09/05/2017 Document Reviewed: 03/06/2016 Elsevier Patient Education  2020 [employer].

## 2020-01-23 DIAGNOSIS — K429 Umbilical hernia without obstruction or gangrene: Secondary | ICD-10-CM | POA: Insufficient documentation

## 2020-01-23 NOTE — H&P (Signed)
[facility] History and Physical  Reason for Referral: Umbilical hernia  Referring Physician:  Dr. Nevada Crane  Chief Complaint    New Patient (Initial Visit)      Miguel Spencer is a 69 y.o. male.  HPI: Miguel Spencer is a 69 yo who has a history of A fib on Eliquis, HTN, alcohol use who comes in with complaints of a umbilical hernia that he has had for over 30+ years. He reports having a prior history of an inguinal hernia and inguinal hernia repair, and that he had the umbilical hernia at that time also. He had to undergo a more urgent procedure for the inguinal hernia and says he had some significant pain after this hernia repair.  He reports a history of some chronic pain issues, and says that he has been trying to exercise more and lose some weight he put on during COVID. With this activity, he has noticed the umbilical hernia more, and it has been causing him discomfort. The pain is achy to sharp in nature, and he denies any history of obstruction or nausea/vomiting related to the hernia.   He reports drinking about 6 pints a few times a week but he can go several days without drinking at all.   ECHO 2019 with some mild LVH, EF 50%.  He has been followed by GI for some liver fibrosis/ alcoholic cirrhosis, imaging without any ascites or varices noted.  He also had a ascending colon ulceration thought to be potentially related to ischemic changes but CTA was negative. Biopsies without IBD or malignancy.   Past Medical History:  Diagnosis Date  . A-fib ([place])   . A-fib ([place])   . Alcohol abuse   . Alcoholic ([place])   . Anxiety   . Colon polyp   . Depression   . GI bleed   . Gout   . Hypertension   . Vertigo     Past Surgical History:  Procedure Laterality Date  . BIOPSY  02/26/2019   Procedure: BIOPSY;  Surgeon: [name], MD;  [facility];  Service: Endoscopy;;  Gastric   . BIOPSY  03/05/2019   Procedure: BIOPSY;  Surgeon: Daneil Dolin, MD;   [facility];  Service: Endoscopy;;  ascending colon bx  . COLONOSCOPY  07/2018   [place] piecemeal fashion status post tattooing, 30 x 18 mm sized sigmoid colon polyp removed via snare status post tattooing.  Recommended repeat colonoscopy in 3 months.  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  . COLONOSCOPY WITH PROPOFOL N/A 03/05/2019   Procedure: COLONOSCOPY WITH PROPOFOL;  Surgeon: Daneil Dolin, MD;  [facility];  Service: Endoscopy;  Laterality: N/A;  . ESOPHAGOGASTRODUODENOSCOPY (EGD) WITH PROPOFOL N/A 02/26/2019   Dr. Oneida Alar: LA grade D esophagitis, gastritis with benign biopsies, no H. pylori, 5 nonbleeding superficial duodenal ulcers.  Felt to be NSAID related.  Marland Kitchen HERNIA REPAIR    . POLYPECTOMY  03/05/2019   Procedure: POLYPECTOMY;  Surgeon: Daneil Dolin, MD;  [facility];  Service: Endoscopy;;  cold snare polypectomy    Family History  Problem Relation Age of Onset  . Cancer Mother   . Heart disease Father   . Heart disease Brother   . Heart disease Other   . Colon cancer Neg Hx   . Gastric cancer Neg Hx   . Esophageal cancer Neg  Hx     Social History   Tobacco Use  . Smoking status: Never Smoker  . Smokeless tobacco: Never Used  Substance Use Topics  . Alcohol use: Not Currently    Comment: No ETOH 6-8 weeks (as of 02/15/19). "alcoholic all my life"; Currently drinks all day for 7 days straight 1-2 times (weeks) a month  . Drug use: No    Medications: I have reviewed the patient's current medications. Allergies as of 01/20/2020   No Known Allergies     Medication List       Accurate as of January 20, 2020 11:59 PM. If you have any questions, ask your nurse or doctor.        STOP taking these medications   B-COMPLEX PO Stopped by: Virl Cagey, MD   colchicine 0.6 MG tablet Stopped by: Virl Cagey,  MD   cyclobenzaprine 10 MG tablet Commonly known as: FLEXERIL Stopped by: Virl Cagey, MD   famotidine 40 MG tablet Commonly known as: PEPCID Stopped by: Virl Cagey, MD   FISH OIL PO Stopped by: Virl Cagey, MD     TAKE these medications   Advil 200 MG tablet Generic drug: ibuprofen Take 800 mg by mouth every 6 (six) hours as needed for headache.   apixaban 5 MG Tabs tablet Commonly known as: Eliquis Take 1 tablet (5 mg total) by mouth 2 (two) times daily.   diltiazem 180 MG 24 hr capsule Commonly known as: CARDIZEM CD Take 1 capsule (180 mg total) by mouth daily. What changed: how much to take   indomethacin 50 MG capsule Commonly known as: INDOCIN Take 50 mg by mouth as needed (Gout).   LORazepam 1 MG tablet Commonly known as: ATIVAN Take 1 mg by mouth at bedtime as needed for sleep. What changed: Another medication with the same name was removed. Continue taking this medication, and follow the directions you see here. Changed by: Virl Cagey, MD   LYSINE PO Take 1 capsule by mouth daily.   metoprolol succinate 100 MG 24 hr tablet Commonly known as: TOPROL-XL Take 1 tablet (100 mg total) by mouth daily. Take with or immediately following a meal.   pantoprazole 40 MG tablet Commonly known as: PROTONIX Take 40 mg by mouth daily.   sildenafil 100 MG tablet Commonly known as: VIAGRA Take 100 mg by mouth daily as needed for erectile dysfunction.   Valtrex 1000 MG tablet Generic drug: valACYclovir Take 1,000 mg by mouth daily as needed (cold sores).        ROS:  A comprehensive review of systems was negative except for: Gastrointestinal: positive for abdominal pain, nausea and reflux symptoms Denies chest pain or SOB  Blood pressure 121/81, pulse (!) 57, temperature (!) 97.4 F (36.3 C), temperature source Oral, resp. rate 12, height 6' (1.829 m), weight 225 lb (102.1 kg), SpO2 95 %. Physical Exam Vitals reviewed.    Constitutional:      Appearance: Normal appearance.  HENT:     Head: Normocephalic and atraumatic.     Nose: Nose normal.     Mouth/Throat:     Mouth: Mucous membranes are moist.  Eyes:     Extraocular Movements: Extraocular movements intact.     Pupils: Pupils are equal, round, and reactive to light.  Cardiovascular:     Rate and Rhythm: Normal rate. Rhythm irregular.  Pulmonary:     Effort: Pulmonary effort is normal.     Breath sounds: Normal breath  sounds.  Abdominal:     General: There is no distension.     Palpations: Abdomen is soft.     Tenderness: There is abdominal tenderness.     Hernia: A hernia is present. Hernia is present in the umbilical area.     Comments: paritaly able to reduce, tender superiorly  Musculoskeletal:        General: No swelling. Normal range of motion.     Cervical back: Normal range of motion. No rigidity.  Skin:    General: Skin is warm.  Neurological:     General: No focal deficit present.     Mental Status: He is alert and oriented to person, place, and time.  Psychiatric:        Mood and Affect: Mood normal.        Behavior: Behavior normal.        Thought Content: Thought content normal.        Judgment: Judgment normal.     Results: CT a/p- 99991111 umbilical hernia with fat 3cm defect horizontal, thickened ascending colon thought to be from colitis, no ascites and normal liver contour   Assessment & Plan:  Miguel Spencer is a 69 y.o. male with an umbilical hernia that has been there for some time is causing him more discomfort. He thinks it has been getting somewhat larger too. He wants to proceed with repair.   -Discussed need for anesthesia, risk of bleeding, infection, use of mesh, recurrence of the hernia, injury to bowel, and he has opted to proceed. No ascites on exam or in the CT from 02/2019.  -Discussed need for COVID testing preop  -Plan to hold Eliquis on 4/24 and 4/25 for Surgery on 4/26   All questions were answered to  the satisfaction of the patient.   Virl Cagey 01/23/2020, 10:56 AM

## 2020-01-23 NOTE — Progress Notes (Signed)
[facility] History and Physical  Reason for Referral: Umbilical hernia  Referring Physician:  Dr. Nevada Crane  Chief Complaint    New Patient (Initial Visit)      Miguel Spencer is a 69 y.o. male.  HPI: Miguel Spencer is a 69 yo who has a history of A fib on Eliquis, HTN, alcohol use who comes in with complaints of a umbilical hernia that he has had for over 30+ years. He reports having a prior history of an inguinal hernia and inguinal hernia repair, and that he had the umbilical hernia at that time also. He had to undergo a more urgent procedure for the inguinal hernia and says he had some significant pain after this hernia repair.  He reports a history of some chronic pain issues, and says that he has been trying to exercise more and lose some weight he put on during COVID. With this activity, he has noticed the umbilical hernia more, and it has been causing him discomfort. The pain is achy to sharp in nature, and he denies any history of obstruction or nausea/vomiting related to the hernia.   He reports drinking about 6 pints a few times a week but he can go several days without drinking at all.   ECHO 2019 with some mild LVH, EF 50%.  He has been followed by GI for some liver fibrosis/ alcoholic cirrhosis, imaging without any ascites or varices noted.  He also had a ascending colon ulceration thought to be potentially related to ischemic changes but CTA was negative. Biopsies without IBD or malignancy.   Past Medical History:  Diagnosis Date  . A-fib ([place])   . A-fib ([place])   . Alcohol abuse   . Alcoholic ([place])   . Anxiety   . Colon polyp   . Depression   . GI bleed   . Gout   . Hypertension   . Vertigo     Past Surgical History:  Procedure Laterality Date  . BIOPSY  02/26/2019   Procedure: BIOPSY;  Surgeon: [name], MD;  [facility];  Service: Endoscopy;;  Gastric   . BIOPSY  03/05/2019   Procedure: BIOPSY;  Surgeon: Daneil Dolin, MD;   Location: [facility];  Service: Endoscopy;;  ascending colon bx  . COLONOSCOPY  07/2018   Morrison piecemeal fashion status post tattooing, 30 x 18 mm sized sigmoid colon polyp removed via snare status post tattooing.  Recommended repeat colonoscopy in 3 months.  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  . COLONOSCOPY WITH PROPOFOL N/A 03/05/2019   Procedure: COLONOSCOPY WITH PROPOFOL;  Surgeon: Daneil Dolin, MD;  [facility];  Service: Endoscopy;  Laterality: N/A;  . ESOPHAGOGASTRODUODENOSCOPY (EGD) WITH PROPOFOL N/A 02/26/2019   Dr. Oneida Alar: LA grade D esophagitis, gastritis with benign biopsies, no H. pylori, 5 nonbleeding superficial duodenal ulcers.  Felt to be NSAID related.  Marland Kitchen HERNIA REPAIR    . POLYPECTOMY  03/05/2019   Procedure: POLYPECTOMY;  Surgeon: Daneil Dolin, MD;  [facility]: [facility];  Service: Endoscopy;;  cold snare polypectomy    Family History  Problem Relation Age of Onset  . Cancer Mother   . Heart disease Father   . Heart disease Brother   . Heart disease Other   . Colon cancer Neg Hx   . Gastric cancer Neg Hx   . Esophageal cancer Neg  Hx     Social History   Tobacco Use  . Smoking status: Never Smoker  . Smokeless tobacco: Never Used  Substance Use Topics  . Alcohol use: Not Currently    Comment: No ETOH 6-8 weeks (as of 02/15/19). "alcoholic all my life"; Currently drinks all day for 7 days straight 1-2 times (weeks) a month  . Drug use: No    Medications: I have reviewed the patient's current medications. Allergies as of 01/20/2020   No Known Allergies     Medication List       Accurate as of January 20, 2020 11:59 PM. If you have any questions, ask your nurse or doctor.        STOP taking these medications   B-COMPLEX PO Stopped by: Virl Cagey, MD   colchicine 0.6 MG tablet Stopped by: Virl Cagey,  MD   cyclobenzaprine 10 MG tablet Commonly known as: FLEXERIL Stopped by: Virl Cagey, MD   famotidine 40 MG tablet Commonly known as: PEPCID Stopped by: Virl Cagey, MD   FISH OIL PO Stopped by: Virl Cagey, MD     TAKE these medications   Advil 200 MG tablet Generic drug: ibuprofen Take 800 mg by mouth every 6 (six) hours as needed for headache.   apixaban 5 MG Tabs tablet Commonly known as: Eliquis Take 1 tablet (5 mg total) by mouth 2 (two) times daily.   diltiazem 180 MG 24 hr capsule Commonly known as: CARDIZEM CD Take 1 capsule (180 mg total) by mouth daily. What changed: how much to take   indomethacin 50 MG capsule Commonly known as: INDOCIN Take 50 mg by mouth as needed (Gout).   LORazepam 1 MG tablet Commonly known as: ATIVAN Take 1 mg by mouth at bedtime as needed for sleep. What changed: Another medication with the same name was removed. Continue taking this medication, and follow the directions you see here. Changed by: Virl Cagey, MD   LYSINE PO Take 1 capsule by mouth daily.   metoprolol succinate 100 MG 24 hr tablet Commonly known as: TOPROL-XL Take 1 tablet (100 mg total) by mouth daily. Take with or immediately following a meal.   pantoprazole 40 MG tablet Commonly known as: PROTONIX Take 40 mg by mouth daily.   sildenafil 100 MG tablet Commonly known as: VIAGRA Take 100 mg by mouth daily as needed for erectile dysfunction.   Valtrex 1000 MG tablet Generic drug: valACYclovir Take 1,000 mg by mouth daily as needed (cold sores).        ROS:  A comprehensive review of systems was negative except for: Gastrointestinal: positive for abdominal pain, nausea and reflux symptoms Denies chest pain or SOB  Blood pressure 121/81, pulse (!) 57, temperature (!) 97.4 F (36.3 C), temperature source Oral, resp. rate 12, height 6' (1.829 m), weight 225 lb (102.1 kg), SpO2 95 %. Physical Exam Vitals reviewed.    Constitutional:      Appearance: Normal appearance.  HENT:     Head: Normocephalic and atraumatic.     Nose: Nose normal.     Mouth/Throat:     Mouth: Mucous membranes are moist.  Eyes:     Extraocular Movements: Extraocular movements intact.     Pupils: Pupils are equal, round, and reactive to light.  Cardiovascular:     Rate and Rhythm: Normal rate. Rhythm irregular.  Pulmonary:     Effort: Pulmonary effort is normal.     Breath sounds: Normal breath  sounds.  Abdominal:     General: There is no distension.     Palpations: Abdomen is soft.     Tenderness: There is abdominal tenderness.     Hernia: A hernia is present. Hernia is present in the umbilical area.     Comments: paritaly able to reduce, tender superiorly  Musculoskeletal:        General: No swelling. Normal range of motion.     Cervical back: Normal range of motion. No rigidity.  Skin:    General: Skin is warm.  Neurological:     General: No focal deficit present.     Mental Status: He is alert and oriented to person, place, and time.  Psychiatric:        Mood and Affect: Mood normal.        Behavior: Behavior normal.        Thought Content: Thought content normal.        Judgment: Judgment normal.     Results: CT a/p- 99991111 umbilical hernia with fat 3cm defect horizontal, thickened ascending colon thought to be from colitis, no ascites and normal liver contour   Assessment & Plan:  Miguel Spencer is a 69 y.o. male with an umbilical hernia that has been there for some time is causing him more discomfort. He thinks it has been getting somewhat larger too. He wants to proceed with repair.   -Discussed need for anesthesia, risk of bleeding, infection, use of mesh, recurrence of the hernia, injury to bowel, and he has opted to proceed. No ascites on exam or in the CT from 02/2019.  -Discussed need for COVID testing preop  -Plan to hold Eliquis on 4/24 and 4/25 for Surgery on 4/26   All questions were answered to  the satisfaction of the patient.   Virl Cagey 01/23/2020, 10:56 AM

## 2020-01-26 NOTE — Patient Instructions (Signed)
Miguel Spencer  01/26/2020     @PREFPERIOPPHARMACY @   Your procedure is scheduled on  01/31/2020 .  [facility] to [facility] at  [facility].  Call this number if you have problems the morning of surgery:  [phone]   Remember:  Do not eat or drink after midnight.                         Take these medicines the morning of surgery with A SIP OF WATER  Diltiazem, indocin, metoprolol, protonix.    Do not wear jewelry, make-up or nail polish.  Do not wear lotions, powders, or perfumes. Please wear deodorant and brush your teeth.  Do not shave 48 hours prior to surgery.  Men may shave face and neck.  Do not bring valuables to the hospital.  [facility] is not responsible for any belongings or valuables.  Contacts, dentures or bridgework may not be worn into surgery.  Leave your suitcase in the car.  After surgery it may be brought to your room.  For patients admitted to the hospital, discharge time will be determined by your treatment team.  Patients discharged the day of surgery will not be allowed to drive home.   Name and phone number of your driver:   family Special instructions:  DO NOT smoke the morning of your procedure. Follow any instructions from Dr Charlesetta Shanks concerning your eliquis.  Please read over the following fact sheets that you were given. Anesthesia Post-op Instructions and Care and Recovery After Surgery       Open Hernia Repair, Adult, Care After These instructions give you information about caring for yourself after your procedure. Your doctor may also give you more specific instructions. If you have problems or questions, contact your doctor. Follow these instructions at home: Surgical cut (incision) care   Follow instructions from your doctor about how to take care of your surgical cut area. Make sure you: ? Wash your hands with soap and water before you change your bandage (dressing). If you cannot use soap and water, use hand  sanitizer. ? Change your bandage as told by your doctor. ? Leave stitches (sutures), skin glue, or skin tape (adhesive) strips in place. They may need to stay in place for 2 weeks or longer. If tape strips get loose and curl up, you may trim the loose edges. Do not remove tape strips completely unless your doctor says it is okay.  Check your surgical cut every day for signs of infection. Check for: ? More redness, swelling, or pain. ? More fluid or blood. ? Warmth. ? Pus or a bad smell. Activity  Do not drive or use heavy machinery while taking prescription pain medicine. Do not drive until your doctor says it is okay.  Until your doctor says it is okay: ? Do not lift anything that is heavier than 10 lb (4.5 kg). ? Do not play contact sports.  Return to your normal activities as told by your doctor. Ask your doctor what activities are safe. General instructions  To prevent or treat having a hard time pooping (constipation) while you are taking prescription pain medicine, your doctor may recommend that you: ? Drink enough fluid to keep your pee (urine) clear or pale yellow. ? Take over-the-counter or prescription medicines. ? Eat foods that are high in fiber, such as fresh fruits and vegetables, whole grains, and beans. ? Limit foods  that are high in fat and processed sugars, such as fried and sweet foods.  Take over-the-counter and prescription medicines only as told by your doctor.  Do not take baths, swim, or use a hot tub until your doctor says it is okay.  Keep all follow-up visits as told by your doctor. This is important. Contact a doctor if:  You develop a rash.  You have more redness, swelling, or pain around your surgical cut.  You have more fluid or blood coming from your surgical cut.  Your surgical cut feels warm to the touch.  You have pus or a bad smell coming from your surgical cut.  You have a fever or chills.  You have blood in your poop (stool).  You  have not pooped in 2-3 days.  Medicine does not help your pain. Get help right away if:  You have chest pain or you are short of breath.  You feel light-headed.  You feel weak and dizzy (feel faint).  You have very bad pain.  You throw up (vomit) and your pain is worse. This information is not intended to replace advice given to you by your health care provider. Make sure you discuss any questions you have with your health care provider. Document Revised: 01/15/2019 Document Reviewed: 03/06/2016 Elsevier Patient Education  2020 [place] Anesthesia, Adult, Care After This sheet gives you information about how to care for yourself after your procedure. Your health care provider may also give you more specific instructions. If you have problems or questions, contact your health care provider. What can I expect after the procedure? After the procedure, the following side effects are common:  Pain or discomfort at the IV site.  Nausea.  Vomiting.  Sore throat.  Trouble concentrating.  Feeling cold or chills.  Weak or tired.  Sleepiness and fatigue.  Soreness and body aches. These side effects can affect parts of the body that were not involved in surgery. Follow these instructions at home:  For at least 24 hours after the procedure:  Have a responsible adult stay with you. It is important to have someone help care for you until you are awake and alert.  Rest as needed.  Do not: ? Participate in activities in which you could fall or become injured. ? Drive. ? Use heavy machinery. ? Drink alcohol. ? Take sleeping pills or medicines that cause drowsiness. ? Make important decisions or sign legal documents. ? Take care of children on your own. Eating and drinking  Follow any instructions from your health care provider about eating or drinking restrictions.  When you feel hungry, start by eating small amounts of foods that are soft and easy to digest  (bland), such as toast. Gradually return to your regular diet.  Drink enough fluid to keep your urine pale yellow.  If you vomit, rehydrate by drinking water, juice, or clear broth. General instructions  If you have sleep apnea, surgery and certain medicines can increase your risk for breathing problems. Follow instructions from your health care provider about wearing your sleep device: ? Anytime you are sleeping, including during daytime naps. ? While taking prescription pain medicines, sleeping medicines, or medicines that make you drowsy.  Return to your normal activities as told by your health care provider. Ask your health care provider what activities are safe for you.  Take over-the-counter and prescription medicines only as told by your health care provider.  If you smoke, do not smoke without supervision.  Keep all follow-up visits as told by your health care provider. This is important. Contact a health care provider if:  You have nausea or vomiting that does not get better with medicine.  You cannot eat or drink without vomiting.  You have pain that does not get better with medicine.  You are unable to pass urine.  You develop a skin rash.  You have a fever.  You have redness around your IV site that gets worse. Get help right away if:  You have difficulty breathing.  You have chest pain.  You have blood in your urine or stool, or you vomit blood. Summary  After the procedure, it is common to have a sore throat or nausea. It is also common to feel tired.  Have a responsible adult stay with you for the first 24 hours after general anesthesia. It is important to have someone help care for you until you are awake and alert.  When you feel hungry, start by eating small amounts of foods that are soft and easy to digest (bland), such as toast. Gradually return to your regular diet.  Drink enough fluid to keep your urine pale yellow.  Return to your normal  activities as told by your health care provider. Ask your health care provider what activities are safe for you. This information is not intended to replace advice given to you by your health care provider. Make sure you discuss any questions you have with your health care provider. Document Revised: 09/26/2017 Document Reviewed: 05/09/2017 Elsevier Patient Education  [place]. How to Use Chlorhexidine for Bathing Chlorhexidine gluconate (CHG) is a germ-killing (antiseptic) solution that is used to clean the skin. It can get rid of the bacteria that normally live on the skin and can keep them away for about 24 hours. To clean your skin with CHG, you may be given:  A CHG solution to use in the shower or as part of a sponge bath.  A prepackaged cloth that contains CHG. Cleaning your skin with CHG may help lower the risk for infection:  While you are staying in the intensive care unit of the hospital.  If you have a vascular access, such as a central line, to provide short-term or long-term access to your veins.  If you have a catheter to drain urine from your bladder.  If you are on a ventilator. A ventilator is a machine that helps you breathe by moving air in and out of your lungs.  After surgery. What are the risks? Risks of using CHG include:  A skin reaction.  Hearing loss, if CHG gets in your ears.  Eye injury, if CHG gets in your eyes and is not rinsed out.  The CHG product catching fire. Make sure that you avoid smoking and flames after applying CHG to your skin. Do not use CHG:  If you have a chlorhexidine allergy or have previously reacted to chlorhexidine.  On babies younger than 25 months of age. How to use CHG solution  Use CHG only as told by your health care provider, and follow the instructions on the label.  Use the full amount of CHG as directed. Usually, this is one bottle. During a shower Follow these steps when using CHG solution during a shower  (unless your health care provider gives you different instructions): 1. Start the shower. 2. Use your normal soap and shampoo to wash your face and hair. 3. Turn off the shower or move out of the  shower stream. 4. Pour the CHG onto a clean washcloth. Do not use any type of brush or rough-edged sponge. 5. Starting at your neck, lather your body down to your toes. Make sure you follow these instructions: ? If you will be having surgery, pay special attention to the part of your body where you will be having surgery. Scrub this area for at least 1 minute. ? Do not use CHG on your head or face. If the solution gets into your ears or eyes, rinse them well with water. ? Avoid your genital area. ? Avoid any areas of skin that have broken skin, cuts, or scrapes. ? Scrub your back and under your arms. Make sure to wash skin folds. 6. Let the lather sit on your skin for 1-2 minutes or as long as told by your health care provider. 7. Thoroughly rinse your entire body in the shower. Make sure that all body creases and crevices are rinsed well. 8. Dry off with a clean towel. Do not put any substances on your body afterward--such as powder, lotion, or perfume--unless you are told to do so by your health care provider. Only use lotions that are recommended by the manufacturer. 9. Put on clean clothes or pajamas. 10. If it is the night before your surgery, sleep in clean sheets.  During a sponge bath Follow these steps when using CHG solution during a sponge bath (unless your health care provider gives you different instructions): 1. Use your normal soap and shampoo to wash your face and hair. 2. Pour the CHG onto a clean washcloth. 3. Starting at your neck, lather your body down to your toes. Make sure you follow these instructions: ? If you will be having surgery, pay special attention to the part of your body where you will be having surgery. Scrub this area for at least 1 minute. ? Do not use CHG on your  head or face. If the solution gets into your ears or eyes, rinse them well with water. ? Avoid your genital area. ? Avoid any areas of skin that have broken skin, cuts, or scrapes. ? Scrub your back and under your arms. Make sure to wash skin folds. 4. Let the lather sit on your skin for 1-2 minutes or as long as told by your health care provider. 5. Using a different clean, wet washcloth, thoroughly rinse your entire body. Make sure that all body creases and crevices are rinsed well. 6. Dry off with a clean towel. Do not put any substances on your body afterward--such as powder, lotion, or perfume--unless you are told to do so by your health care provider. Only use lotions that are recommended by the manufacturer. 7. Put on clean clothes or pajamas. 8. If it is the night before your surgery, sleep in clean sheets. How to use CHG prepackaged cloths  Only use CHG cloths as told by your health care provider, and follow the instructions on the label.  Use the CHG cloth on clean, dry skin.  Do not use the CHG cloth on your head or face unless your health care provider tells you to.  When washing with the CHG cloth: ? Avoid your genital area. ? Avoid any areas of skin that have broken skin, cuts, or scrapes. Before surgery Follow these steps when using a CHG cloth to clean before surgery (unless your health care provider gives you different instructions): 1. Using the CHG cloth, vigorously scrub the part of your body where you will be  having surgery. Scrub using a back-and-forth motion for 3 minutes. The area on your body should be completely wet with CHG when you are done scrubbing. 2. Do not rinse. Discard the cloth and let the area air-dry. Do not put any substances on the area afterward, such as powder, lotion, or perfume. 3. Put on clean clothes or pajamas. 4. If it is the night before your surgery, sleep in clean sheets.  For general bathing Follow these steps when using CHG cloths for  general bathing (unless your health care provider gives you different instructions). 1. Use a separate CHG cloth for each area of your body. Make sure you wash between any folds of skin and between your fingers and toes. Wash your body in the following order, switching to a new cloth after each step: ? The front of your neck, shoulders, and chest. ? Both of your arms, under your arms, and your hands. ? Your stomach and groin area, avoiding the genitals. ? Your right leg and foot. ? Your left leg and foot. ? The back of your neck, your back, and your buttocks. 2. Do not rinse. Discard the cloth and let the area air-dry. Do not put any substances on your body afterward--such as powder, lotion, or perfume--unless you are told to do so by your health care provider. Only use lotions that are recommended by the manufacturer. 3. Put on clean clothes or pajamas. Contact a health care provider if:  Your skin gets irritated after scrubbing.  You have questions about using your solution or cloth. Get help right away if:  Your eyes become very red or swollen.  Your eyes itch badly.  Your skin itches badly and is red or swollen.  Your hearing changes.  You have trouble seeing.  You have swelling or tingling in your mouth or throat.  You have trouble breathing.  You swallow any chlorhexidine. Summary  Chlorhexidine gluconate (CHG) is a germ-killing (antiseptic) solution that is used to clean the skin. Cleaning your skin with CHG may help to lower your risk for infection.  You may be given CHG to use for bathing. It may be in a bottle or in a prepackaged cloth to use on your skin. Carefully follow your health care provider's instructions and the instructions on the product label.  Do not use CHG if you have a chlorhexidine allergy.  Contact your health care provider if your skin gets irritated after scrubbing. This information is not intended to replace advice given to you by your health  care provider. Make sure you discuss any questions you have with your health care provider. Document Revised: 12/10/2018 Document Reviewed: 08/21/2017 Elsevier Patient Education  [place].

## 2020-01-27 ENCOUNTER — Other Ambulatory Visit: Payer: Self-pay

## 2020-01-27 ENCOUNTER — Encounter (HOSPITAL_COMMUNITY)
Admission: RE | Admit: 2020-01-27 | Discharge: 2020-01-27 | Disposition: A | Discharge status: Home / Self Care | Payer: Medicare HMO | Source: Ambulatory Visit | Attending: General Surgery | Admitting: General Surgery

## 2020-01-27 ENCOUNTER — Encounter (HOSPITAL_COMMUNITY): Payer: Self-pay

## 2020-01-27 DIAGNOSIS — R001 Bradycardia, unspecified: Secondary | ICD-10-CM | POA: Insufficient documentation

## 2020-01-27 DIAGNOSIS — I440 Atrioventricular block, first degree: Secondary | ICD-10-CM | POA: Insufficient documentation

## 2020-01-27 DIAGNOSIS — Z01818 Encounter for other preprocedural examination: Secondary | ICD-10-CM | POA: Diagnosis not present

## 2020-01-27 LAB — CBC WITH DIFFERENTIAL/PLATELET
Abs Immature Granulocytes: 0.14 10*3/uL — ABNORMAL HIGH (ref 0.00–0.07)
Basophils Absolute: 0.1 10*3/uL (ref 0.0–0.1)
Basophils Relative: 1 %
Eosinophils Absolute: 0.2 10*3/uL (ref 0.0–0.5)
Eosinophils Relative: 3 %
HCT: 39.9 % (ref 39.0–52.0)
Hemoglobin: 13.6 g/dL (ref 13.0–17.0)
Immature Granulocytes: 2 %
Lymphocytes Relative: 39 %
Lymphs Abs: 2.8 10*3/uL (ref 0.7–4.0)
MCH: 31.6 pg (ref 26.0–34.0)
MCHC: 34.1 g/dL (ref 30.0–36.0)
MCV: 92.6 fL (ref 80.0–100.0)
Monocytes Absolute: 1 10*3/uL (ref 0.1–1.0)
Monocytes Relative: 13 %
Neutro Abs: 3 10*3/uL (ref 1.7–7.7)
Neutrophils Relative %: 42 %
Platelets: 201 10*3/uL (ref 150–400)
RBC: 4.31 MIL/uL (ref 4.22–5.81)
RDW: 13.6 % (ref 11.5–15.5)
WBC: 7.2 10*3/uL (ref 4.0–10.5)
nRBC: 0 % (ref 0.0–0.2)

## 2020-01-27 LAB — COMPREHENSIVE METABOLIC PANEL
ALT: 23 U/L (ref 0–44)
AST: 24 U/L (ref 15–41)
Albumin: 3.9 g/dL (ref 3.5–5.0)
Alkaline Phosphatase: 64 U/L (ref 38–126)
Anion gap: 9 (ref 5–15)
BUN: 10 mg/dL (ref 8–23)
CO2: 24 mmol/L (ref 22–32)
Calcium: 8.6 mg/dL — ABNORMAL LOW (ref 8.9–10.3)
Chloride: 97 mmol/L — ABNORMAL LOW (ref 98–111)
Creatinine, Ser: 0.92 mg/dL (ref 0.61–1.24)
GFR calc Af Amer: >60 mL/min (ref >60)
GFR calc non Af Amer: >60 mL/min (ref >60)
Glucose, Bld: 108 mg/dL — ABNORMAL HIGH (ref 70–99)
Potassium: 4 mmol/L (ref 3.5–5.1)
Sodium: 130 mmol/L — ABNORMAL LOW (ref 135–145)
Total Bilirubin: 0.5 mg/dL (ref 0.3–1.2)
Total Protein: 6.8 g/dL (ref 6.5–8.1)

## 2020-01-27 LAB — PROTIME-INR
INR: 1.2 (ref 0.8–1.2)
Prothrombin Time: 15.4 seconds — ABNORMAL HIGH (ref 11.4–15.2)

## 2020-01-28 ENCOUNTER — Other Ambulatory Visit (HOSPITAL_COMMUNITY)
Admission: RE | Admit: 2020-01-28 | Discharge: 2020-01-28 | Disposition: A | Discharge status: Home / Self Care | Payer: Medicare HMO | Source: Ambulatory Visit | Attending: General Surgery | Admitting: General Surgery

## 2020-01-28 DIAGNOSIS — Z20822 Contact with and (suspected) exposure to covid-19: Secondary | ICD-10-CM | POA: Insufficient documentation

## 2020-01-28 DIAGNOSIS — Z01812 Encounter for preprocedural laboratory examination: Secondary | ICD-10-CM | POA: Diagnosis not present

## 2020-01-29 ENCOUNTER — Encounter (HOSPITAL_COMMUNITY): Payer: Self-pay | Admitting: Anesthesiology

## 2020-01-29 LAB — SARS CORONAVIRUS 2 (TAT 6-24 HRS): SARS Coronavirus 2: NEGATIVE

## 2020-01-31 ENCOUNTER — Encounter (HOSPITAL_COMMUNITY): Admission: RE | Payer: Self-pay | Source: Home / Self Care

## 2020-01-31 ENCOUNTER — Ambulatory Visit (HOSPITAL_COMMUNITY): Admission: RE | Admit: 2020-01-31 | Payer: Medicare HMO | Source: Home / Self Care | Admitting: General Surgery

## 2020-01-31 DIAGNOSIS — K429 Umbilical hernia without obstruction or gangrene: Secondary | ICD-10-CM | POA: Diagnosis present

## 2020-01-31 SURGERY — REPAIR, HERNIA, UMBILICAL, ADULT
Anesthesia: General

## 2020-01-31 MED ORDER — BUPIVACAINE LIPOSOME 1.3 % IJ SUSP
INTRAMUSCULAR | Status: AC
  Filled 2020-01-31: qty 20

## 2020-01-31 NOTE — OR Nursing (Signed)
Patient called and stated that  he felt bad and was in A-Fib this morning .  Cancelling procedure for today and stated that he will reschedule this surgery at another time.

## 2020-05-12 DIAGNOSIS — D500 Iron deficiency anemia secondary to blood loss (chronic): Secondary | ICD-10-CM | POA: Diagnosis not present

## 2020-05-12 DIAGNOSIS — Z1329 Encounter for screening for other suspected endocrine disorder: Secondary | ICD-10-CM | POA: Diagnosis not present

## 2020-05-12 DIAGNOSIS — E782 Mixed hyperlipidemia: Secondary | ICD-10-CM | POA: Diagnosis not present

## 2020-05-12 DIAGNOSIS — D649 Anemia, unspecified: Secondary | ICD-10-CM | POA: Diagnosis not present

## 2020-05-12 DIAGNOSIS — R7301 Impaired fasting glucose: Secondary | ICD-10-CM | POA: Diagnosis not present

## 2020-05-16 DIAGNOSIS — E782 Mixed hyperlipidemia: Secondary | ICD-10-CM | POA: Diagnosis not present

## 2020-05-16 DIAGNOSIS — K2921 Alcoholic gastritis with bleeding: Secondary | ICD-10-CM | POA: Diagnosis not present

## 2020-05-16 DIAGNOSIS — M791 Myalgia, unspecified site: Secondary | ICD-10-CM | POA: Diagnosis not present

## 2020-05-16 DIAGNOSIS — E871 Hypo-osmolality and hyponatremia: Secondary | ICD-10-CM | POA: Diagnosis not present

## 2020-05-16 DIAGNOSIS — F1010 Alcohol abuse, uncomplicated: Secondary | ICD-10-CM | POA: Diagnosis not present

## 2020-05-16 DIAGNOSIS — K588 Other irritable bowel syndrome: Secondary | ICD-10-CM | POA: Diagnosis not present

## 2020-05-16 DIAGNOSIS — I480 Paroxysmal atrial fibrillation: Secondary | ICD-10-CM | POA: Diagnosis not present

## 2020-05-16 DIAGNOSIS — D649 Anemia, unspecified: Secondary | ICD-10-CM | POA: Diagnosis not present

## 2020-05-16 DIAGNOSIS — G4700 Insomnia, unspecified: Secondary | ICD-10-CM | POA: Diagnosis not present

## 2020-06-06 ENCOUNTER — Ambulatory Visit (INDEPENDENT_AMBULATORY_CARE_PROVIDER_SITE_OTHER): Payer: Medicare HMO | Admitting: General Surgery

## 2020-06-06 ENCOUNTER — Encounter: Payer: Self-pay | Admitting: General Surgery

## 2020-06-06 ENCOUNTER — Other Ambulatory Visit: Payer: Self-pay

## 2020-06-06 VITALS — BP 146/94 | HR 60 | Temp 98.3°F | Resp 16 | Ht 71.0 in | Wt 225.0 lb

## 2020-06-06 DIAGNOSIS — K429 Umbilical hernia without obstruction or gangrene: Secondary | ICD-10-CM

## 2020-06-06 NOTE — Progress Notes (Signed)
[facility] History and Physical  Reason for Visit: Umbilical hernia   Miguel Spencer is a 69 y.o. male.  HPI: Miguel Spencer is a 69 yo who was scheduled for surgery months ago but became very short of breath and had a cough the day of surgery and canceled. He is unsure if he was getting a cold or if he was anxious. Dr.Hall has referred him back for umbilical hernia repair. The patient reports he has been doing fair but does have some discomfort in the umbilical area and the area may have enlarged. He is getting married later in September.    He is on Eliquis for A fib and has a history of some alcohol use daily.  He states the hernia discomfort is achy in nature and that it does bulge out. He denies any obstructive symptoms. He goes several days between drinking.   Past Medical History:  Diagnosis Date  . A-fib ([place])   . A-fib ([place])   . Alcohol abuse   . Alcoholic ([place])   . Anxiety   . Colon polyp   . Depression   . GI bleed   . Gout   . Hypertension   . Vertigo     Past Surgical History:  Procedure Laterality Date  . BIOPSY  02/26/2019   Procedure: BIOPSY;  Surgeon: [name], MD;  [facility];  Service: Endoscopy;;  Gastric   . BIOPSY  03/05/2019   Procedure: BIOPSY;  Surgeon: Daneil Dolin, MD;  [facility];  Service: Endoscopy;;  ascending colon bx  . COLONOSCOPY  07/2018   [place] piecemeal fashion status post tattooing, 30 x 18 mm sized sigmoid colon polyp removed via snare status post tattooing.  Recommended repeat colonoscopy in 3 months.  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  . COLONOSCOPY WITH PROPOFOL N/A 03/05/2019   Procedure: COLONOSCOPY WITH PROPOFOL;  Surgeon: Daneil Dolin, MD;  [facility];  Service: Endoscopy;  Laterality: N/A;  . ESOPHAGOGASTRODUODENOSCOPY (EGD) WITH PROPOFOL N/A  02/26/2019   Dr. Oneida Alar: LA grade D esophagitis, gastritis with benign biopsies, no H. pylori, 5 nonbleeding superficial duodenal ulcers.  Felt to be NSAID related.  Marland Kitchen HERNIA REPAIR     inguinal-not sure which side  . POLYPECTOMY  03/05/2019   Procedure: POLYPECTOMY;  Surgeon: Daneil Dolin, MD;  [facility];  Service: Endoscopy;;  cold snare polypectomy    Family History  Problem Relation Age of Onset  . Cancer Mother   . Heart disease Father   . Heart disease Brother   . Heart disease Other   . Colon cancer Neg Hx   . Gastric cancer Neg Hx   . Esophageal cancer Neg Hx     Social History   Tobacco Use  . Smoking status: Never Smoker  . Smokeless tobacco: Never Used  Vaping Use  . Vaping Use: Never used  Substance Use Topics  . Alcohol use: Yes    Alcohol/week: 70.0 standard drinks    Types: 70 Cans of beer per week    Comment: 10 beers daily  . Drug use: No    Medications: I have reviewed the patient's current medications. Allergies as of 06/06/2020   No Known Allergies     Medication List       Accurate as of June 06, 2020 10:38 AM. If you have  any questions, ask your nurse or doctor.        STOP taking these medications   diltiazem 180 MG 24 hr capsule Commonly known as: CARDIZEM CD Stopped by: Virl Cagey, MD     TAKE these medications   Advil 200 MG tablet Generic drug: ibuprofen Take 800 mg by mouth every 6 (six) hours as needed for headache.   apixaban 5 MG Tabs tablet Commonly known as: Eliquis Take 1 tablet (5 mg total) by mouth 2 (two) times daily.   indomethacin 50 MG capsule Commonly known as: INDOCIN Take 50 mg by mouth as needed (Gout).   LORazepam 1 MG tablet Commonly known as: ATIVAN Take 1 mg by mouth at bedtime as needed for sleep.   metoprolol succinate 100 MG 24 hr tablet Commonly known as: TOPROL-XL Take 1 tablet (100 mg total) by mouth daily. Take with or immediately following a meal.   pantoprazole 40  MG tablet Commonly known as: PROTONIX Take 40 mg by mouth daily.   sildenafil 100 MG tablet Commonly known as: VIAGRA Take 100 mg by mouth daily as needed for erectile dysfunction.   Valtrex 1000 MG tablet Generic drug: valACYclovir Take 1,000 mg by mouth daily as needed (cold sores).        ROS:  A comprehensive review of systems was negative except for: Gastrointestinal: positive for abdominal pain and reflux symptoms  Blood pressure (!) 146/94, pulse 60, temperature 98.3 F (36.8 C), temperature source Oral, resp. rate 16, height 5\' 11"  (1.803 m), weight 225 lb (102.1 kg), SpO2 96 %. Physical Exam Vitals reviewed.  Constitutional:      Appearance: Normal appearance.  HENT:     Head: Normocephalic.     Nose: Nose normal.     Mouth/Throat:     Mouth: Mucous membranes are moist.  Eyes:     Pupils: Pupils are equal, round, and reactive to light.  Cardiovascular:     Rate and Rhythm: Normal rate and regular rhythm.  Pulmonary:     Effort: Pulmonary effort is normal.     Breath sounds: Normal breath sounds.  Abdominal:     General: There is no distension.     Palpations: Abdomen is soft.     Tenderness: There is no abdominal tenderness.     Hernia: A hernia is present. Hernia is present in the umbilical area.     Comments: Reducible, 2-3cm defect, tender  Musculoskeletal:        General: No swelling. Normal range of motion.     Cervical back: Normal range of motion.  Skin:    General: Skin is warm and dry.  Neurological:     General: No focal deficit present.     Mental Status: He is alert and oriented to person, place, and time.  Psychiatric:        Mood and Affect: Mood normal.        Behavior: Behavior normal.        Thought Content: Thought content normal.        Judgment: Judgment normal.     Results: CT 05./2020- personally reviewed umbilical hernia defect 3cm   Assessment & Plan:  Miguel Spencer is a 69 y.o. male with a reducible umbilical hernia  that he is ready to get fixed. He canceled his last procedure for SOB and could have been related to some anxiety. He has A fib on Eliquis.   -Hold Eliquis 2 days prior to surgery -Discussed risk of  bleeding, infection, use of mesh, recurrence, injury to bowel.  -Discussed preop COVID testing  -Plan for after his wedding   All questions were answered to the satisfaction of the patient.   Virl Cagey 06/06/2020, 10:38 AM

## 2020-06-06 NOTE — Patient Instructions (Signed)
Hold Eliquis on 9/25 and 9/26.    Umbilical Hernia, Adult  A hernia is a bulge of tissue that pushes through an opening between muscles. An umbilical hernia happens in the abdomen, near the belly button (umbilicus). The hernia may contain tissues from the small intestine, large intestine, or fatty tissue covering the intestines (omentum). Umbilical hernias in adults tend to get worse over time, and they require surgical treatment. There are several types of umbilical hernias. You may have:  A hernia located just above or below the umbilicus (indirect hernia). This is the most common type of umbilical hernia in adults.  A hernia that forms through an opening formed by the umbilicus (direct hernia).  A hernia that comes and goes (reducible hernia). A reducible hernia may be visible only when you strain, lift something heavy, or cough. This type of hernia can be pushed back into the abdomen (reduced).  A hernia that traps abdominal tissue inside the hernia (incarcerated hernia). This type of hernia cannot be reduced.  A hernia that cuts off blood flow to the tissues inside the hernia (strangulated hernia). The tissues can start to die if this happens. This type of hernia requires emergency treatment. What are the causes? An umbilical hernia happens when tissue inside the abdomen presses on a weak area of the abdominal muscles. What increases the risk? You may have a greater risk of this condition if you:  Are obese.  Have had several pregnancies.  Have a buildup of fluid inside your abdomen (ascites).  Have had surgery that weakens the abdominal muscles. What are the signs or symptoms? The main symptom of this condition is a painless bulge at or near the belly button. A reducible hernia may be visible only when you strain, lift something heavy, or cough. Other symptoms may include:  Dull pain.  A feeling of pressure. Symptoms of a strangulated hernia may include:  Pain that gets  increasingly worse.  Nausea and vomiting.  Pain when pressing on the hernia.  Skin over the hernia becoming red or purple.  Constipation.  Blood in the stool. How is this diagnosed? This condition may be diagnosed based on:  A physical exam. You may be asked to cough or strain while standing. These actions increase the pressure inside your abdomen and force the hernia through the opening in your muscles. Your health care provider may try to reduce the hernia by pressing on it.  Your symptoms and medical history. How is this treated? Surgery is the only treatment for an umbilical hernia. Surgery for a strangulated hernia is done as soon as possible. If you have a small hernia that is not incarcerated, you may need to lose weight before having surgery. Follow these instructions at home:  Lose weight, if told by your health care provider.  Do not try to push the hernia back in.  Watch your hernia for any changes in color or size. Tell your health care provider if any changes occur.  You may need to avoid activities that increase pressure on your hernia.  Do not lift anything that is heavier than 10 lb (4.5 kg) until your health care provider says that this is safe.  Take over-the-counter and prescription medicines only as told by your health care provider.  Keep all follow-up visits as told by your health care provider. This is important. Contact a health care provider if:  Your hernia gets larger.  Your hernia becomes painful. Get help right away if:  You develop  sudden, severe pain near the area of your hernia.  You have pain as well as nausea or vomiting.  You have pain and the skin over your hernia changes color.  You develop a fever. This information is not intended to replace advice given to you by your health care provider. Make sure you discuss any questions you have with your health care provider. Document Revised: 11/05/2017 Document Reviewed:  03/24/2017 Elsevier Patient Education  [place] Repair, Adult  Open hernia repair is a surgical procedure to fix a hernia. A hernia occurs when an internal organ or tissue pushes out through a weak spot in the abdominal wall muscles. Hernias commonly occur in the groin and around the navel. Most hernias tend to get worse over time. Often, surgery is done to prevent the hernia from becoming bigger, uncomfortable, or an emergency. Emergency surgery may be needed if abdominal contents get stuck in the opening (incarcerated hernia) or the blood supply gets cut off (strangulated hernia). In an open repair, an incision is made in the abdomen to perform the surgery. Tell a health care provider about:  Any allergies you have.  All medicines you are taking, including vitamins, herbs, eye drops, creams, and over-the-counter medicines.  Any problems you or family members have had with anesthetic medicines.  Any blood or bone disorders you have.  Any surgeries you have had.  Any medical conditions you have, including any recent cold or flu symptoms.  Whether you are pregnant or may be pregnant. What are the risks? Generally, this is a safe procedure. However, problems may occur, including:  Long-lasting (chronic) pain.  Bleeding.  Infection.  Damage to the testicle. This can cause shrinking or swelling.  Damage to the bladder, blood vessels, intestine, or nerves near the hernia.  Trouble passing urine.  Allergic reactions to medicines.  Return of the hernia. Medicines  Ask your health care provider about: ? Changing or stopping your regular medicines. This is especially important if you are taking diabetes medicines or blood thinners. ? Taking medicines such as aspirin and ibuprofen. These medicines can thin your blood. Do not take these medicines before your procedure if your health care provider instructs you not to.  You may be given antibiotic medicine  to help prevent infection. General instructions  You may have blood tests or imaging studies.  Ask your health care provider how your surgical site will be marked or identified.  If you smoke, do not smoke for at least 2 weeks before your procedure or for as long as told by your health care provider.  Let your health care provider know if you develop a cold or any infection before your surgery.  Plan to have someone take you home from the hospital or clinic.  If you will be going home right after the procedure, plan to have someone with you for 24 hours. What happens during the procedure?  To reduce your risk of infection: ? Your health care team will wash or sanitize their hands. ? Your skin will be washed with soap. ? Hair may be removed from the surgical area.  An IV tube will be inserted into one of your veins.  You will be given one or more of the following: ? A medicine to help you relax (sedative). ? A medicine to numb the area (local anesthetic). ? A medicine to make you fall asleep (general anesthetic).  Your surgeon will make an incision over the hernia.  The  tissues of the hernia will be moved back into place.  The edges of the hernia may be stitched together.  The opening in the abdominal muscles will be closed with stitches (sutures). Or, your surgeon will place a mesh patch made of manmade (synthetic) material over the opening.  The incision will be closed.  A bandage (dressing) may be placed over the incision. The procedure may vary among health care providers and hospitals. What happens after the procedure?  Your blood pressure, heart rate, breathing rate, and blood oxygen level will be monitored until the medicines you were given have worn off.  You may be given medicine for pain.  Do not drive for 24 hours if you received a sedative. This information is not intended to replace advice given to you by your health care provider. Make sure you discuss any  questions you have with your health care provider. Document Revised: 09/05/2017 Document Reviewed: 03/06/2016 Elsevier Patient Education  2020 [employer].

## 2020-06-07 NOTE — H&P (Signed)
[facility] History and Physical  Reason for Visit: Umbilical hernia   Miguel Spencer is a 69 y.o. male.  HPI: Miguel Spencer is a 69 yo who was scheduled for surgery months ago but became very short of breath and had a cough the day of surgery and canceled. He is unsure if he was getting a cold or if he was anxious. Dr.Hall has referred him back for umbilical hernia repair. The patient reports he has been doing fair but does have some discomfort in the umbilical area and the area may have enlarged. He is getting married later in September.    He is on Eliquis for A fib and has a history of some alcohol use daily.  He states the hernia discomfort is achy in nature and that it does bulge out. He denies any obstructive symptoms. He goes several days between drinking.   Past Medical History:  Diagnosis Date   A-fib (Miguel Spencer)    A-fib (Miguel Spencer)    Alcohol abuse    Alcoholic ([place])    Anxiety    Colon polyp    Depression    GI bleed    Gout    Hypertension    Vertigo     Past Surgical History:  Procedure Laterality Date   BIOPSY  02/26/2019   Procedure: BIOPSY;  Surgeon: Miguel Binder, MD;  [facility];  Service: Endoscopy;;  Gastric    BIOPSY  03/05/2019   Procedure: BIOPSY;  Surgeon: Miguel Dolin, MD;  [facility];  Service: Endoscopy;;  ascending colon bx   COLONOSCOPY  07/2018   [place] piecemeal fashion status post tattooing, 30 x 18 mm sized sigmoid colon polyp removed via snare status post tattooing.  Recommended repeat colonoscopy in 3 months.  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   COLONOSCOPY WITH PROPOFOL N/A 03/05/2019   Procedure: COLONOSCOPY WITH PROPOFOL;  Surgeon: Miguel Dolin, MD;  [facility];  Service: Endoscopy;  Laterality: N/A;   ESOPHAGOGASTRODUODENOSCOPY (EGD) WITH PROPOFOL N/A  02/26/2019   Dr. Oneida Spencer: LA grade D esophagitis, gastritis with benign biopsies, no H. pylori, 5 nonbleeding superficial duodenal ulcers.  Felt to be NSAID related.   HERNIA REPAIR     inguinal-not sure which side   POLYPECTOMY  03/05/2019   Procedure: POLYPECTOMY;  Surgeon: Miguel Dolin, MD;  [facility];  Service: Endoscopy;;  cold snare polypectomy    Family History  Problem Relation Age of Onset   Cancer Mother    Heart disease Father    Heart disease Brother    Heart disease Other    Colon cancer Neg Hx    Gastric cancer Neg Hx    Esophageal cancer Neg Hx     Social History   Tobacco Use   Smoking status: Never Smoker   Smokeless tobacco: Never Used  Vaping Use   Vaping Use: Never used  Substance Use Topics   Alcohol use: Yes    Alcohol/week: 70.0 standard drinks    Types: 70 Cans of beer per week    Comment: 10 beers daily   Drug use: No    Medications: I have reviewed the patient's current medications. Allergies as of 06/06/2020   No Known Allergies     Medication List       Accurate as of June 06, 2020 10:38 AM. If you have  any questions, ask your nurse or doctor.        STOP taking these medications   diltiazem 180 MG 24 hr capsule Commonly known as: CARDIZEM CD Stopped by: Virl Cagey, MD     TAKE these medications   Advil 200 MG tablet Generic drug: ibuprofen Take 800 mg by mouth every 6 (six) hours as needed for headache.   apixaban 5 MG Tabs tablet Commonly known as: Eliquis Take 1 tablet (5 mg total) by mouth 2 (two) times daily.   indomethacin 50 MG capsule Commonly known as: INDOCIN Take 50 mg by mouth as needed (Gout).   LORazepam 1 MG tablet Commonly known as: ATIVAN Take 1 mg by mouth at bedtime as needed for sleep.   metoprolol succinate 100 MG 24 hr tablet Commonly known as: TOPROL-XL Take 1 tablet (100 mg total) by mouth daily. Take with or immediately following a meal.   pantoprazole 40  MG tablet Commonly known as: PROTONIX Take 40 mg by mouth daily.   sildenafil 100 MG tablet Commonly known as: VIAGRA Take 100 mg by mouth daily as needed for erectile dysfunction.   Valtrex 1000 MG tablet Generic drug: valACYclovir Take 1,000 mg by mouth daily as needed (cold sores).        ROS:  A comprehensive review of systems was negative except for: Gastrointestinal: positive for abdominal pain and reflux symptoms  Blood pressure (!) 146/94, pulse 60, temperature 98.3 F (36.8 C), temperature source Oral, resp. rate 16, height 5\' 11"  (1.803 m), weight 225 lb (102.1 kg), SpO2 96 %. Physical Exam Vitals reviewed.  Constitutional:      Appearance: Normal appearance.  HENT:     Head: Normocephalic.     Nose: Nose normal.     Mouth/Throat:     Mouth: Mucous membranes are moist.  Eyes:     Pupils: Pupils are equal, round, and reactive to light.  Cardiovascular:     Rate and Rhythm: Normal rate and regular rhythm.  Pulmonary:     Effort: Pulmonary effort is normal.     Breath sounds: Normal breath sounds.  Abdominal:     General: There is no distension.     Palpations: Abdomen is soft.     Tenderness: There is no abdominal tenderness.     Hernia: A hernia is present. Hernia is present in the umbilical area.     Comments: Reducible, 2-3cm defect, tender  Musculoskeletal:        General: No swelling. Normal range of motion.     Cervical back: Normal range of motion.  Skin:    General: Skin is warm and dry.  Neurological:     General: No focal deficit present.     Mental Status: He is alert and oriented to person, place, and time.  Psychiatric:        Mood and Affect: Mood normal.        Behavior: Behavior normal.        Thought Content: Thought content normal.        Judgment: Judgment normal.     Results: CT 05./2020- personally reviewed umbilical hernia defect 3cm   Assessment & Plan:  Miguel Spencer is a 69 y.o. male with a reducible umbilical hernia  that he is ready to get fixed. He canceled his last procedure for SOB and could have been related to some anxiety. He has A fib on Eliquis.   -Hold Eliquis 2 days prior to surgery -Discussed risk of  bleeding, infection, use of mesh, recurrence, injury to bowel.  -Discussed preop COVID testing  -Plan for after his wedding   All questions were answered to the satisfaction of the patient.   Virl Cagey 06/06/2020, 10:38 AM

## 2020-06-23 ENCOUNTER — Ambulatory Visit: Payer: Medicare HMO | Admitting: Cardiology

## 2020-07-03 NOTE — Patient Instructions (Signed)
Your procedure is scheduled on: 07/05/2020  Report to [facility] at  6:15   AM.  Call this number if you have problems the morning of surgery: [phone]   Remember:   Do not Eat or Drink after midnight         No Smoking the morning of surgery  :  Take these medicines the morning of surgery with A SIP OF WATER: Diltiazem, metoprolol, and pantoprazole             Hold Eliquis for 48 hours as instructed per Dr Constance Haw   Do not wear jewelry, make-up or nail polish.  Do not wear lotions, powders, or perfumes. You may wear deodorant.  Do not shave 48 hours prior to surgery. Men may shave face and neck.  Do not bring valuables to the hospital.  Contacts, dentures or bridgework may not be worn into surgery.  Leave suitcase in the car. After surgery it may be brought to your room.  For patients admitted to the hospital, checkout time is 11:00 AM the day of discharge.   Patients discharged the day of surgery will not be allowed to drive home.    Special Instructions: Shower using CHG night before surgery and shower the day of surgery use CHG.  Use special wash - you have one bottle of CHG for all showers.  You should use approximately 1/2 of the bottle for each shower.  How to Use Chlorhexidine for Bathing Chlorhexidine gluconate (CHG) is a germ-killing (antiseptic) solution that is used to clean the skin. It can get rid of the bacteria that normally live on the skin and can keep them away for about 24 hours. To clean your skin with CHG, you may be given:  A CHG solution to use in the shower or as part of a sponge bath.  A prepackaged cloth that contains CHG. Cleaning your skin with CHG may help lower the risk for infection:  While you are staying in the intensive care unit of the hospital.  If you have a vascular access, such as a central line, to provide short-term or long-term access to your veins.  If you have a catheter to drain urine from your bladder.  If you are on a  ventilator. A ventilator is a machine that helps you breathe by moving air in and out of your lungs.  After surgery. What are the risks? Risks of using CHG include:  A skin reaction.  Hearing loss, if CHG gets in your ears.  Eye injury, if CHG gets in your eyes and is not rinsed out.  The CHG product catching fire. Make sure that you avoid smoking and flames after applying CHG to your skin. Do not use CHG:  If you have a chlorhexidine allergy or have previously reacted to chlorhexidine.  On babies younger than 22 months of age. How to use CHG solution  Use CHG only as told by your health care provider, and follow the instructions on the label.  Use the full amount of CHG as directed. Usually, this is one bottle. During a shower Follow these steps when using CHG solution during a shower (unless your health care provider gives you different instructions): 1. Start the shower. 2. Use your normal soap and shampoo to wash your face and hair. 3. Turn off the shower or move out of the shower stream. 4. Pour the CHG onto a clean washcloth. Do not use any type of brush or rough-edged sponge. 5. Starting at  your neck, lather your body down to your toes. Make sure you follow these instructions: ? If you will be having surgery, pay special attention to the part of your body where you will be having surgery. Scrub this area for at least 1 minute. ? Do not use CHG on your head or face. If the solution gets into your ears or eyes, rinse them well with water. ? Avoid your genital area. ? Avoid any areas of skin that have broken skin, cuts, or scrapes. ? Scrub your back and under your arms. Make sure to wash skin folds. 6. Let the lather sit on your skin for 1-2 minutes or as long as told by your health care provider. 7. Thoroughly rinse your entire body in the shower. Make sure that all body creases and crevices are rinsed well. 8. Dry off with a clean towel. Do not put any substances on your  body afterward--such as powder, lotion, or perfume--unless you are told to do so by your health care provider. Only use lotions that are recommended by the manufacturer. 9. Put on clean clothes or pajamas. 10. If it is the night before your surgery, sleep in clean sheets.  During a sponge bath Follow these steps when using CHG solution during a sponge bath (unless your health care provider gives you different instructions): 1. Use your normal soap and shampoo to wash your face and hair. 2. Pour the CHG onto a clean washcloth. 3. Starting at your neck, lather your body down to your toes. Make sure you follow these instructions: ? If you will be having surgery, pay special attention to the part of your body where you will be having surgery. Scrub this area for at least 1 minute. ? Do not use CHG on your head or face. If the solution gets into your ears or eyes, rinse them well with water. ? Avoid your genital area. ? Avoid any areas of skin that have broken skin, cuts, or scrapes. ? Scrub your back and under your arms. Make sure to wash skin folds. 4. Let the lather sit on your skin for 1-2 minutes or as long as told by your health care provider. 5. Using a different clean, wet washcloth, thoroughly rinse your entire body. Make sure that all body creases and crevices are rinsed well. 6. Dry off with a clean towel. Do not put any substances on your body afterward--such as powder, lotion, or perfume--unless you are told to do so by your health care provider. Only use lotions that are recommended by the manufacturer. 7. Put on clean clothes or pajamas. 8. If it is the night before your surgery, sleep in clean sheets. How to use CHG prepackaged cloths  Only use CHG cloths as told by your health care provider, and follow the instructions on the label.  Use the CHG cloth on clean, dry skin.  Do not use the CHG cloth on your head or face unless your health care provider tells you to.  When washing  with the CHG cloth: ? Avoid your genital area. ? Avoid any areas of skin that have broken skin, cuts, or scrapes. Before surgery Follow these steps when using a CHG cloth to clean before surgery (unless your health care provider gives you different instructions): 1. Using the CHG cloth, vigorously scrub the part of your body where you will be having surgery. Scrub using a back-and-forth motion for 3 minutes. The area on your body should be completely wet with CHG when  you are done scrubbing. 2. Do not rinse. Discard the cloth and let the area air-dry. Do not put any substances on the area afterward, such as powder, lotion, or perfume. 3. Put on clean clothes or pajamas. 4. If it is the night before your surgery, sleep in clean sheets.  For general bathing Follow these steps when using CHG cloths for general bathing (unless your health care provider gives you different instructions). 1. Use a separate CHG cloth for each area of your body. Make sure you wash between any folds of skin and between your fingers and toes. Wash your body in the following order, switching to a new cloth after each step: ? The front of your neck, shoulders, and chest. ? Both of your arms, under your arms, and your hands. ? Your stomach and groin area, avoiding the genitals. ? Your right leg and foot. ? Your left leg and foot. ? The back of your neck, your back, and your buttocks. 2. Do not rinse. Discard the cloth and let the area air-dry. Do not put any substances on your body afterward--such as powder, lotion, or perfume--unless you are told to do so by your health care provider. Only use lotions that are recommended by the manufacturer. 3. Put on clean clothes or pajamas. Contact a health care provider if:  Your skin gets irritated after scrubbing.  You have questions about using your solution or cloth. Get help right away if:  Your eyes become very red or swollen.  Your eyes itch badly.  Your skin itches  badly and is red or swollen.  Your hearing changes.  You have trouble seeing.  You have swelling or tingling in your mouth or throat.  You have trouble breathing.  You swallow any chlorhexidine. Summary  Chlorhexidine gluconate (CHG) is a germ-killing (antiseptic) solution that is used to clean the skin. Cleaning your skin with CHG may help to lower your risk for infection.  You may be given CHG to use for bathing. It may be in a bottle or in a prepackaged cloth to use on your skin. Carefully follow your health care provider's instructions and the instructions on the product label.  Do not use CHG if you have a chlorhexidine allergy.  Contact your health care provider if your skin gets irritated after scrubbing. This information is not intended to replace advice given to you by your health care provider. Make sure you discuss any questions you have with your health care provider. Document Revised: 12/10/2018 Document Reviewed: 08/21/2017 Elsevier Patient Education  [place].  Umbilical Hernia, Adult  A hernia is a bulge of tissue that pushes through an opening between muscles. An umbilical hernia happens in the abdomen, near the belly button (umbilicus). The hernia may contain tissues from the small intestine, large intestine, or fatty tissue covering the intestines (omentum). Umbilical hernias in adults tend to get worse over time, and they require surgical treatment. There are several types of umbilical hernias. You may have:  A hernia located just above or below the umbilicus (indirect hernia). This is the most common type of umbilical hernia in adults.  A hernia that forms through an opening formed by the umbilicus (direct hernia).  A hernia that comes and goes (reducible hernia). A reducible hernia may be visible only when you strain, lift something heavy, or cough. This type of hernia can be pushed back into the abdomen (reduced).  A hernia that traps abdominal  tissue inside the hernia (incarcerated hernia). This  type of hernia cannot be reduced.  A hernia that cuts off blood flow to the tissues inside the hernia (strangulated hernia). The tissues can start to die if this happens. This type of hernia requires emergency treatment. What are the causes? An umbilical hernia happens when tissue inside the abdomen presses on a weak area of the abdominal muscles. What increases the risk? You may have a greater risk of this condition if you:  Are obese.  Have had several pregnancies.  Have a buildup of fluid inside your abdomen (ascites).  Have had surgery that weakens the abdominal muscles. What are the signs or symptoms? The main symptom of this condition is a painless bulge at or near the belly button. A reducible hernia may be visible only when you strain, lift something heavy, or cough. Other symptoms may include:  Dull pain.  A feeling of pressure. Symptoms of a strangulated hernia may include:  Pain that gets increasingly worse.  Nausea and vomiting.  Pain when pressing on the hernia.  Skin over the hernia becoming red or purple.  Constipation.  Blood in the stool. How is this diagnosed? This condition may be diagnosed based on:  A physical exam. You may be asked to cough or strain while standing. These actions increase the pressure inside your abdomen and force the hernia through the opening in your muscles. Your health care provider may try to reduce the hernia by pressing on it.  Your symptoms and medical history. How is this treated? Surgery is the only treatment for an umbilical hernia. Surgery for a strangulated hernia is done as soon as possible. If you have a small hernia that is not incarcerated, you may need to lose weight before having surgery. Follow these instructions at home:  Lose weight, if told by your health care provider.  Do not try to push the hernia back in.  Watch your hernia for any changes in color or  size. Tell your health care provider if any changes occur.  You may need to avoid activities that increase pressure on your hernia.  Do not lift anything that is heavier than 10 lb (4.5 kg) until your health care provider says that this is safe.  Take over-the-counter and prescription medicines only as told by your health care provider.  Keep all follow-up visits as told by your health care provider. This is important. Contact a health care provider if:  Your hernia gets larger.  Your hernia becomes painful. Get help right away if:  You develop sudden, severe pain near the area of your hernia.  You have pain as well as nausea or vomiting.  You have pain and the skin over your hernia changes color.  You develop a fever. This information is not intended to replace advice given to you by your health care provider. Make sure you discuss any questions you have with your health care provider. Document Revised: 11/05/2017 Document Reviewed: 03/24/2017 Elsevier Patient Education  2020 [place] Anesthesia, Adult, Care After This sheet gives you information about how to care for yourself after your procedure. Your health care provider may also give you more specific instructions. If you have problems or questions, contact your health care provider. What can I expect after the procedure? After the procedure, the following side effects are common:  Pain or discomfort at the IV site.  Nausea.  Vomiting.  Sore throat.  Trouble concentrating.  Feeling cold or chills.  Weak or tired.  Sleepiness and fatigue.  Soreness and body aches. These side effects can affect parts of the body that were not involved in surgery. Follow these instructions at home:  For at least 24 hours after the procedure:  Have a responsible adult stay with you. It is important to have someone help care for you until you are awake and alert.  Rest as needed.  Do not: ? Participate in  activities in which you could fall or become injured. ? Drive. ? Use heavy machinery. ? Drink alcohol. ? Take sleeping pills or medicines that cause drowsiness. ? Make important decisions or sign legal documents. ? Take care of children on your own. Eating and drinking  Follow any instructions from your health care provider about eating or drinking restrictions.  When you feel hungry, start by eating small amounts of foods that are soft and easy to digest (bland), such as toast. Gradually return to your regular diet.  Drink enough fluid to keep your urine pale yellow.  If you vomit, rehydrate by drinking water, juice, or clear broth. General instructions  If you have sleep apnea, surgery and certain medicines can increase your risk for breathing problems. Follow instructions from your health care provider about wearing your sleep device: ? Anytime you are sleeping, including during daytime naps. ? While taking prescription pain medicines, sleeping medicines, or medicines that make you drowsy.  Return to your normal activities as told by your health care provider. Ask your health care provider what activities are safe for you.  Take over-the-counter and prescription medicines only as told by your health care provider.  If you smoke, do not smoke without supervision.  Keep all follow-up visits as told by your health care provider. This is important. Contact a health care provider if:  You have nausea or vomiting that does not get better with medicine.  You cannot eat or drink without vomiting.  You have pain that does not get better with medicine.  You are unable to pass urine.  You develop a skin rash.  You have a fever.  You have redness around your IV site that gets worse. Get help right away if:  You have difficulty breathing.  You have chest pain.  You have blood in your urine or stool, or you vomit blood. Summary  After the procedure, it is common to have a  sore throat or nausea. It is also common to feel tired.  Have a responsible adult stay with you for the first 24 hours after general anesthesia. It is important to have someone help care for you until you are awake and alert.  When you feel hungry, start by eating small amounts of foods that are soft and easy to digest (bland), such as toast. Gradually return to your regular diet.  Drink enough fluid to keep your urine pale yellow.  Return to your normal activities as told by your health care provider. Ask your health care provider what activities are safe for you. This information is not intended to replace advice given to you by your health care provider. Make sure you discuss any questions you have with your health care provider. Document Revised: 09/26/2017 Document Reviewed: 05/09/2017 Elsevier Patient Education  [place].

## 2020-07-04 ENCOUNTER — Other Ambulatory Visit (HOSPITAL_COMMUNITY)
Admission: RE | Admit: 2020-07-04 | Discharge: 2020-07-04 | Disposition: A | Discharge status: Home / Self Care | Payer: Medicare HMO | Source: Ambulatory Visit | Attending: General Surgery | Admitting: General Surgery

## 2020-07-04 ENCOUNTER — Encounter (HOSPITAL_COMMUNITY)
Admission: RE | Admit: 2020-07-04 | Discharge: 2020-07-04 | Disposition: A | Discharge status: Home / Self Care | Payer: Medicare HMO | Source: Ambulatory Visit | Attending: General Surgery | Admitting: General Surgery

## 2020-07-04 ENCOUNTER — Other Ambulatory Visit: Payer: Self-pay

## 2020-07-04 DIAGNOSIS — Z20822 Contact with and (suspected) exposure to covid-19: Secondary | ICD-10-CM | POA: Insufficient documentation

## 2020-07-04 DIAGNOSIS — Z01812 Encounter for preprocedural laboratory examination: Secondary | ICD-10-CM | POA: Diagnosis not present

## 2020-07-04 LAB — COMPREHENSIVE METABOLIC PANEL
ALT: 39 U/L (ref 0–44)
AST: 30 U/L (ref 15–41)
Albumin: 3.9 g/dL (ref 3.5–5.0)
Alkaline Phosphatase: 53 U/L (ref 38–126)
Anion gap: 11 (ref 5–15)
BUN: 10 mg/dL (ref 8–23)
CO2: 21 mmol/L — ABNORMAL LOW (ref 22–32)
Calcium: 9 mg/dL (ref 8.9–10.3)
Chloride: 100 mmol/L (ref 98–111)
Creatinine, Ser: 0.91 mg/dL (ref 0.61–1.24)
GFR calc Af Amer: >60 mL/min (ref >60)
GFR calc non Af Amer: >60 mL/min (ref >60)
Glucose, Bld: 71 mg/dL (ref 70–99)
Potassium: 4.1 mmol/L (ref 3.5–5.1)
Sodium: 132 mmol/L — ABNORMAL LOW (ref 135–145)
Total Bilirubin: 0.5 mg/dL (ref 0.3–1.2)
Total Protein: 6.8 g/dL (ref 6.5–8.1)

## 2020-07-04 LAB — CBC WITH DIFFERENTIAL/PLATELET
Abs Immature Granulocytes: 0.13 10*3/uL — ABNORMAL HIGH (ref 0.00–0.07)
Basophils Absolute: 0.1 10*3/uL (ref 0.0–0.1)
Basophils Relative: 2 %
Eosinophils Absolute: 0.1 10*3/uL (ref 0.0–0.5)
Eosinophils Relative: 2 %
HCT: 40.9 % (ref 39.0–52.0)
Hemoglobin: 13.7 g/dL (ref 13.0–17.0)
Immature Granulocytes: 2 %
Lymphocytes Relative: 36 %
Lymphs Abs: 2.5 10*3/uL (ref 0.7–4.0)
MCH: 31.1 pg (ref 26.0–34.0)
MCHC: 33.5 g/dL (ref 30.0–36.0)
MCV: 93 fL (ref 80.0–100.0)
Monocytes Absolute: 1 10*3/uL (ref 0.1–1.0)
Monocytes Relative: 14 %
Neutro Abs: 3.1 10*3/uL (ref 1.7–7.7)
Neutrophils Relative %: 44 %
Platelets: 220 10*3/uL (ref 150–400)
RBC: 4.4 MIL/uL (ref 4.22–5.81)
RDW: 14.1 % (ref 11.5–15.5)
WBC: 6.9 10*3/uL (ref 4.0–10.5)
nRBC: 0 % (ref 0.0–0.2)

## 2020-07-04 LAB — SARS CORONAVIRUS 2 (TAT 6-24 HRS): SARS Coronavirus 2: NEGATIVE

## 2020-07-04 LAB — PROTIME-INR
INR: 1 (ref 0.8–1.2)
Prothrombin Time: 13 seconds (ref 11.4–15.2)

## 2020-07-05 ENCOUNTER — Other Ambulatory Visit (HOSPITAL_COMMUNITY): Payer: Medicare HMO

## 2020-07-05 ENCOUNTER — Encounter (HOSPITAL_COMMUNITY): Admission: RE | Admit: 2020-07-05 | Payer: Medicare HMO | Source: Ambulatory Visit

## 2020-07-06 ENCOUNTER — Ambulatory Visit (HOSPITAL_COMMUNITY)
Admission: RE | Admit: 2020-07-06 | Discharge: 2020-07-06 | Disposition: A | Discharge status: Home / Self Care | Payer: Medicare HMO | Source: Ambulatory Visit | Attending: General Surgery | Admitting: General Surgery

## 2020-07-06 ENCOUNTER — Encounter (HOSPITAL_COMMUNITY)
Admission: RE | Disposition: A | Discharge status: Home / Self Care | Payer: Self-pay | Source: Ambulatory Visit | Attending: General Surgery

## 2020-07-06 ENCOUNTER — Ambulatory Visit (HOSPITAL_COMMUNITY): Payer: Medicare HMO | Admitting: Anesthesiology

## 2020-07-06 ENCOUNTER — Encounter (HOSPITAL_COMMUNITY): Payer: Self-pay | Admitting: General Surgery

## 2020-07-06 ENCOUNTER — Other Ambulatory Visit: Payer: Self-pay

## 2020-07-06 DIAGNOSIS — M109 Gout, unspecified: Secondary | ICD-10-CM | POA: Diagnosis not present

## 2020-07-06 DIAGNOSIS — I10 Essential (primary) hypertension: Secondary | ICD-10-CM | POA: Insufficient documentation

## 2020-07-06 DIAGNOSIS — F329 Major depressive disorder, single episode, unspecified: Secondary | ICD-10-CM | POA: Diagnosis not present

## 2020-07-06 DIAGNOSIS — I4891 Unspecified atrial fibrillation: Secondary | ICD-10-CM | POA: Insufficient documentation

## 2020-07-06 DIAGNOSIS — F419 Anxiety disorder, unspecified: Secondary | ICD-10-CM | POA: Insufficient documentation

## 2020-07-06 DIAGNOSIS — Z7901 Long term (current) use of anticoagulants: Secondary | ICD-10-CM | POA: Insufficient documentation

## 2020-07-06 DIAGNOSIS — Z79899 Other long term (current) drug therapy: Secondary | ICD-10-CM | POA: Insufficient documentation

## 2020-07-06 DIAGNOSIS — G4730 Sleep apnea, unspecified: Secondary | ICD-10-CM | POA: Insufficient documentation

## 2020-07-06 DIAGNOSIS — K429 Umbilical hernia without obstruction or gangrene: Secondary | ICD-10-CM

## 2020-07-06 HISTORY — PX: UMBILICAL HERNIA REPAIR: SHX196

## 2020-07-06 SURGERY — REPAIR, HERNIA, UMBILICAL, ADULT
Anesthesia: General | Site: Abdomen

## 2020-07-06 MED ORDER — LACTATED RINGERS IV SOLN: Freq: Once | INTRAVENOUS | Status: AC

## 2020-07-06 MED ORDER — LACTATED RINGERS IV SOLN: INTRAVENOUS | Status: DC | PRN

## 2020-07-06 MED ORDER — FENTANYL CITRATE (PF) 100 MCG/2ML IJ SOLN
INTRAMUSCULAR | Status: DC | PRN
Start: 2020-07-06 — End: 2020-07-06
  Administered 2020-07-06 (×2): 100 ug via INTRAVENOUS

## 2020-07-06 MED ORDER — PROPOFOL 10 MG/ML IV BOLUS
INTRAVENOUS | Status: DC | PRN
  Administered 2020-07-06: 200 mg via INTRAVENOUS

## 2020-07-06 MED ORDER — CHLORHEXIDINE GLUCONATE 0.12 % MT SOLN
15.0000 mL | Freq: Once | OROMUCOSAL | Status: AC
  Administered 2020-07-06: 15 mL via OROMUCOSAL

## 2020-07-06 MED ORDER — ROCURONIUM BROMIDE 100 MG/10ML IV SOLN
INTRAVENOUS | Status: DC | PRN
  Administered 2020-07-06: 50 mg via INTRAVENOUS

## 2020-07-06 MED ORDER — MIDAZOLAM HCL 2 MG/2ML IJ SOLN
2.0000 mg | Freq: Once | INTRAMUSCULAR | Status: AC
  Administered 2020-07-06: 2 mg via INTRAVENOUS

## 2020-07-06 MED ORDER — MEPERIDINE HCL 50 MG/ML IJ SOLN: 6.2500 mg | INTRAMUSCULAR | Status: DC | PRN

## 2020-07-06 MED ORDER — MIDAZOLAM HCL 2 MG/2ML IJ SOLN
INTRAMUSCULAR | Status: AC
  Filled 2020-07-06: qty 2

## 2020-07-06 MED ORDER — SODIUM CHLORIDE 0.9 % IR SOLN
Status: DC | PRN
  Administered 2020-07-06: 1000 mL

## 2020-07-06 MED ORDER — CHLORHEXIDINE GLUCONATE CLOTH 2 % EX PADS: 6.0000 | MEDICATED_PAD | Freq: Once | CUTANEOUS | Status: DC

## 2020-07-06 MED ORDER — EPHEDRINE SULFATE 50 MG/ML IJ SOLN
INTRAMUSCULAR | Status: DC | PRN
  Administered 2020-07-06 (×3): 10 mg via INTRAVENOUS

## 2020-07-06 MED ORDER — MIDAZOLAM HCL 5 MG/5ML IJ SOLN
INTRAMUSCULAR | Status: DC | PRN
  Administered 2020-07-06: 2 mg via INTRAVENOUS

## 2020-07-06 MED ORDER — ORAL CARE MOUTH RINSE: 15.0000 mL | Freq: Once | OROMUCOSAL | Status: AC

## 2020-07-06 MED ORDER — DOCUSATE SODIUM 100 MG PO CAPS: 100.0000 mg | ORAL_CAPSULE | Freq: Two times a day (BID) | ORAL | 0 refills | Status: DC | PRN

## 2020-07-06 MED ORDER — CEFAZOLIN SODIUM-DEXTROSE 2-4 GM/100ML-% IV SOLN
2.0000 g | INTRAVENOUS | Status: AC
  Administered 2020-07-06: 2 g via INTRAVENOUS

## 2020-07-06 MED ORDER — LIDOCAINE 2% (20 MG/ML) 5 ML SYRINGE
INTRAMUSCULAR | Status: AC
  Filled 2020-07-06: qty 5

## 2020-07-06 MED ORDER — DEXAMETHASONE SODIUM PHOSPHATE 10 MG/ML IJ SOLN
INTRAMUSCULAR | Status: AC
  Filled 2020-07-06: qty 1

## 2020-07-06 MED ORDER — CEFAZOLIN SODIUM-DEXTROSE 2-4 GM/100ML-% IV SOLN
INTRAVENOUS | Status: AC
  Filled 2020-07-06: qty 100

## 2020-07-06 MED ORDER — SUGAMMADEX SODIUM 200 MG/2ML IV SOLN
INTRAVENOUS | Status: DC | PRN
  Administered 2020-07-06: 400 mg via INTRAVENOUS

## 2020-07-06 MED ORDER — DEXAMETHASONE SODIUM PHOSPHATE 4 MG/ML IJ SOLN
INTRAMUSCULAR | Status: DC | PRN
  Administered 2020-07-06: 10 mg via INTRAVENOUS

## 2020-07-06 MED ORDER — ROCURONIUM BROMIDE 10 MG/ML (PF) SYRINGE
PREFILLED_SYRINGE | INTRAVENOUS | Status: AC
  Filled 2020-07-06: qty 10

## 2020-07-06 MED ORDER — BUPIVACAINE LIPOSOME 1.3 % IJ SUSP
INTRAMUSCULAR | Status: DC | PRN
  Administered 2020-07-06: 20 mL

## 2020-07-06 MED ORDER — OXYCODONE HCL 5 MG PO TABS: 5.0000 mg | ORAL_TABLET | ORAL | 0 refills | Status: DC | PRN

## 2020-07-06 MED ORDER — BUPIVACAINE LIPOSOME 1.3 % IJ SUSP
INTRAMUSCULAR | Status: AC
  Filled 2020-07-06: qty 20

## 2020-07-06 MED ORDER — HYDROMORPHONE HCL 1 MG/ML IJ SOLN
0.2500 mg | INTRAMUSCULAR | Status: DC | PRN
  Administered 2020-07-06 (×2): 0.5 mg via INTRAVENOUS
  Filled 2020-07-06 (×2): qty 0.5

## 2020-07-06 MED ORDER — FENTANYL CITRATE (PF) 100 MCG/2ML IJ SOLN
INTRAMUSCULAR | Status: AC
  Filled 2020-07-06: qty 4

## 2020-07-06 SURGICAL SUPPLY — 37 items
ADH SKN CLS APL DERMABOND .7 (GAUZE/BANDAGES/DRESSINGS) ×1
APL PRP STRL LF DISP 70% ISPRP (MISCELLANEOUS) ×1
BLADE SURG 15 STRL LF DISP TIS (BLADE) ×1 IMPLANT
BLADE SURG 15 STRL SS (BLADE) ×3
CHLORAPREP W/TINT 26 (MISCELLANEOUS) ×3 IMPLANT
CLOTH BEACON ORANGE TIMEOUT ST (SAFETY) ×3 IMPLANT
COVER LIGHT HANDLE STERIS (MISCELLANEOUS) ×6 IMPLANT
COVER WAND RF STERILE (DRAPES) ×3 IMPLANT
DERMABOND ADVANCED (GAUZE/BANDAGES/DRESSINGS) ×2
DERMABOND ADVANCED .7 DNX12 (GAUZE/BANDAGES/DRESSINGS) ×1 IMPLANT
ELECT REM PT RETURN 9FT ADLT (ELECTROSURGICAL) ×3
ELECTRODE REM PT RTRN 9FT ADLT (ELECTROSURGICAL) ×1 IMPLANT
GLOVE BIO SURGEON STRL SZ 6.5 (GLOVE) ×2 IMPLANT
GLOVE BIO SURGEONS STRL SZ 6.5 (GLOVE) ×1
GLOVE BIOGEL PI IND STRL 6.5 (GLOVE) ×1 IMPLANT
GLOVE BIOGEL PI IND STRL 7.0 (GLOVE) ×1 IMPLANT
GLOVE BIOGEL PI INDICATOR 6.5 (GLOVE) ×2
GLOVE BIOGEL PI INDICATOR 7.0 (GLOVE) ×2
GOWN STRL REUS W/TWL LRG LVL3 (GOWN DISPOSABLE) ×6 IMPLANT
GOWN STRL REUS W/TWL XL LVL3 (GOWN DISPOSABLE) ×3 IMPLANT
INST SET MINOR GENERAL (KITS) ×3 IMPLANT
KIT TURNOVER KIT A (KITS) ×3 IMPLANT
MANIFOLD NEPTUNE II (INSTRUMENTS) ×3 IMPLANT
MESH VENTRALEX ST 8CM LRG (Mesh General) ×3 IMPLANT
NEEDLE HYPO 18GX1.5 BLUNT FILL (NEEDLE) ×3 IMPLANT
NEEDLE HYPO 22GX1.5 SAFETY (NEEDLE) ×3 IMPLANT
NS IRRIG 1000ML POUR BTL (IV SOLUTION) ×3 IMPLANT
PACK MINOR (CUSTOM PROCEDURE TRAY) ×3 IMPLANT
PAD ARMBOARD 7.5X6 YLW CONV (MISCELLANEOUS) ×3 IMPLANT
PENCIL SMOKE EVACUATOR (MISCELLANEOUS) ×3 IMPLANT
SET BASIN LINEN APH (SET/KITS/TRAYS/PACK) ×3 IMPLANT
SUT ETHIBOND NAB MO 7 #0 18IN (SUTURE) ×6 IMPLANT
SUT MNCRL AB 4-0 PS2 18 (SUTURE) ×3 IMPLANT
SUT VIC AB 2-0 CT2 27 (SUTURE) ×3 IMPLANT
SUT VIC AB 3-0 SH 27 (SUTURE) ×3
SUT VIC AB 3-0 SH 27X BRD (SUTURE) ×1 IMPLANT
SYR 20ML LL LF (SYRINGE) ×6 IMPLANT

## 2020-07-06 NOTE — Op Note (Signed)
[facility] Operative Note  07/06/20  Preoperative Diagnosis: Umbilical hernia    Postoperative Diagnosis: Same    Procedure(s) Performed: Repair of umbilical hernia with mesh (Ventralex 8cm)    Surgeon: Lanell Matar. Constance Haw, MD   Assistants: No qualified resident was available    Anesthesia: General endotracheal   Anesthesiologist: Denese Killings, MD    Specimens: None    Estimated Blood Loss: Minimal   Blood Replacement: None    Complications: None   Wound Class: Clean    Operative Indications: Mr. Patty is a 69 yo with a enlarging hernia that he has been having some mild discomfort from and wants to get it repaired. We discussed the risk of the procedure including but not limited to bleeding, infection, injury to bowel, use of mesh, recurrence, and he opted to proceed.  Findings:2cm hernia defect containing fat    Procedure: The patient was taken to the operating room and placed supine. General endotracheal anesthesia was induced. Intravenous antibiotics were administered per protocol.  The abdomen was prepared and draped in the usual sterile fashion.   The umbilical hernia was noted to be reducible and measured about 2cm. An incision was made under the umbilicus, and carried down through the subcutaneous tissue with electrocautery.  Dissection was performed down to the level of the fascia, exposing the hernia sac.  The hernia sac was opened with care, and excess hernia sac was resected with electrocautery.  A finger was ran on the underlying peritoneum and this was clear.  A 8 cm Ventralex St Hernia Patch was placed and secured with 0 Ethibond sutures ensuring that it was against the peritoneal cavity.  The hernia defect was then closed with 0 Ethibond suture in an interrupted fashion over the patch.  The umbilicus was tacked to the fascia with a 2-0 Vicryl suture. The deep layers were closed with 3-0 Vicryl interrupted. Exarpel was injected. Hemostasis  was confirmed. The skin was closed with a running 4-0 Monocryl suture and dermabond.  After the dermabond dried a 2X2 and tegaderm were placed over the umbilicus to act as a pressure dressing.    All counts were correct at the end of the case. The patient was awakened from anesthesia and extubated without complication.  The patient went to the PACU in stable condition.  Curlene Labrum, MD [facility] [address] [place], [place] [address] [phone] (office)

## 2020-07-06 NOTE — Transfer of Care (Signed)
Immediate Anesthesia Transfer of Care Note  Patient: Miguel Spencer  Procedure(s) Performed: UMBILICAL HERNIA REPAIR WITH MESH (N/A Abdomen)  Patient Location: PACU  Anesthesia Type:General  Level of Consciousness: awake, alert  and oriented  Airway & Oxygen Therapy: Patient Spontanous Breathing and Patient connected to face mask oxygen  Post-op Assessment: Report given to RN, Post -op Vital signs reviewed and stable and Patient moving all extremities X 4  Post vital signs: Reviewed and stable  Last Vitals:  Vitals Value Taken Time  BP 121/89 07/06/20 1036  Temp    Pulse 54 07/06/20 1040  Resp 17 07/06/20 1040  SpO2 98 % 07/06/20 1040  Vitals shown include unvalidated device data.  Last Pain:  Vitals:   07/06/20 0749  TempSrc: Oral  PainSc: 0-No pain         Complications: No complications documented.

## 2020-07-06 NOTE — Anesthesia Procedure Notes (Signed)
Procedure Name: Intubation Date/Time: 07/06/2020 9:42 AM Performed by: Jonna Munro, CRNA Pre-anesthesia Checklist: Patient identified, Emergency Drugs available, Suction available, Patient being monitored and Timeout performed Patient Re-evaluated:Patient Re-evaluated prior to induction Oxygen Delivery Method: Circle system utilized Preoxygenation: Pre-oxygenation with 100% oxygen Induction Type: IV induction Ventilation: Mask ventilation without difficulty Laryngoscope Size: Mac and 4 Grade View: Grade I Tube type: Oral Tube size: 7.5 mm Number of attempts: 1 Placement Confirmation: positive ETCO2,  ETT inserted through vocal cords under direct vision and breath sounds checked- equal and bilateral Secured at: 23 cm Tube secured with: Tape Dental Injury: Teeth and Oropharynx as per pre-operative assessment

## 2020-07-06 NOTE — Discharge Instructions (Signed)
Discharge Instructions Hernia:  Eliquis can be started back on 07/08/2020, Saturday.   Common Complaints: Pain at the incision site is common. This will improve with time. Take your pain medications as described below. Some nausea is common and poor appetite. The main goal is to stay hydrated the first few days after surgery.   Diet/ Activity: Diet as tolerated. You may not have an appetite, but it is important to stay hydrated. Drink 64 ounces of water a day. Your appetite will return with time.  Remove the small clear dressing and gauze after two days (48 hours). Trim the gauze off the glue that is underneath if the gauze is stuck to the glue. Shower per your regular routine daily.  Do not take hot showers. Take warm showers that are less than 10 minutes. Rest and listen to your body, but do not remain in bed all day.Walk everyday for at least 15-20 minutes.  Deep cough and move around every 1-2 hours in the first few days after surgery. Do not pick at the dermabond glue on your incision sites.  This glue film will remain in place for 1-2 weeks and will start to peel off. Do not place lotions or balms on your incision unless instructed to specifically by Dr. Constance Haw. Do not lift > 10 lbs, perform excessive bending, pushing, pulling, squatting for 6-8 weeks after surgery. Where your abdominal binder with activity as much as possible. The activity restrictions and the abdominal binder are to prevent hernia formation at your incision while you are healing.   Pain Expectations and Narcotics: -After surgery you will have pain associated with your incisions and this is normal. The pain is muscular and nerve pain, and will get better with time. -You are encouraged and expected to take non narcotic medications like tylenol and ibuprofen (when able) to treat pain as multiple modalities can aid with pain treatment. -Narcotics are only used when pain is severe or there is breakthrough pain. -You are  not expected to have a pain score of 0 after surgery, as we cannot prevent pain. A pain score of 3-4 that allows you to be functional, move, walk, and tolerate some activity is the goal. The pain will continue to improve over the days after surgery and is dependent on your surgery. -Due to [place] law, we are only able to give a certain amount of pain medication to treat post operative pain, and we only give additional narcotics on a patient by patient basis.  -For most laparoscopic surgery, studies have shown that the majority of patients only need 10-15 narcotic pills, and for open surgeries most patients only need 15-20.   -Having appropriate expectations of pain and knowledge of pain management with non narcotics is important as we do not want anyone to become addicted to narcotic pain medication.  -Using ice packs in the first 48 hours and heating pads after 48 hours, wearing an abdominal binder (when recommended), and using over the counter medications are all ways to help with pain management.   -Simple acts like meditation and mindfulness practices after surgery can also help with pain control and research has proven the benefit of these practices.  Medication: Take tylenol and ibuprofen as needed for pain control, alternating every 4-6 hours.  Example:  Tylenol 1000mg  @ 6am, 12noon, 6pm, 83midnight (Do not exceed 4000mg  of tylenol a day). Ibuprofen 800mg  @ 9am, 3pm, 9pm, 3am (Do not exceed 3600mg  of ibuprofen a day).  Take Roxicodone for breakthrough pain every  4 hours.  Take Colace for constipation related to narcotic pain medication. If you do not have a bowel movement in 2 days, take Miralax over the counter.  Drink plenty of water to also prevent constipation.   Contact Information: If you have questions or concerns, please call our office, [phone], Monday- Thursday 8AM-5PM and Friday 8AM-12Noon.  If it is after hours or on the weekend, please call [facility] Main Number, [phone], and  ask to speak to the surgeon on call for Dr. Constance Haw at [facility].     Open Hernia Repair, Adult, Care After These instructions give you information about caring for yourself after your procedure. Your doctor may also give you more specific instructions. If you have problems or questions, contact your doctor. Follow these instructions at home: Surgical cut (incision) care   Follow instructions from your doctor about how to take care of your surgical cut area. Make sure you: ? Wash your hands with soap and water before you change your bandage (dressing). If you cannot use soap and water, use hand sanitizer. ? Change your bandage as told by your doctor.  Leave stitches (sutures), skin glue, or skin tape (adhesive) strips in place.   Check your surgical cut every day for signs of infection. Check for: ? More redness, swelling, or pain. ? More fluid or blood. ? Warmth. ? Pus or a bad smell. Activity  Do not drive or use heavy machinery while taking prescription pain medicine. Do not drive until your doctor says it is okay.  Until your doctor says it is okay: ? Do not lift anything that is heavier than 10 lb (4.5 kg). ? Do not play contact sports.  Return to your normal activities as told by your doctor. Ask your doctor what activities are safe. General instructions  To prevent or treat having a hard time pooping (constipation) while you are taking prescription pain medicine, your doctor may recommend that you: ? Drink enough fluid to keep your pee (urine) clear or pale yellow. ? Take over-the-counter or prescription medicines. ? Eat foods that are high in fiber, such as fresh fruits and vegetables, whole grains, and beans. ? Limit foods that are high in fat and processed sugars, such as fried and sweet foods.  Take over-the-counter and prescription medicines only as told by your doctor.  Do not take baths, swim, or use a hot tub until your doctor says it is okay.  Keep all  follow-up visits as told by your doctor. This is important. Contact a doctor if:  You develop a rash.  You have more redness, swelling, or pain around your surgical cut.  You have more fluid or blood coming from your surgical cut.  Your surgical cut feels warm to the touch.  You have pus or a bad smell coming from your surgical cut.  You have a fever or chills.  You have blood in your poop (stool).  You have not pooped in 2-3 days.  Medicine does not help your pain. Get help right away if:  You have chest pain or you are short of breath.  You feel light-headed.  You feel weak and dizzy (feel faint).  You have very bad pain.  You throw up (vomit) and your pain is worse. This information is not intended to replace advice given to you by your health care provider. Make sure you discuss any questions you have with your health care provider. Document Revised: 01/15/2019 Document Reviewed: 03/06/2016 Elsevier Patient Education  [id number]  [employer].

## 2020-07-06 NOTE — Interval H&P Note (Signed)
History and Physical Interval Note:  07/06/2020 9:24 AM  Miguel Spencer  has presented today for surgery, with the diagnosis of Umbilical hernia.  The various methods of treatment have been discussed with the patient and family. After consideration of risks, benefits and other options for treatment, the patient has consented to  Procedure(s): HERNIA REPAIR UMBILICAL ADULT (N/A) as a surgical intervention.  The patient's history has been reviewed, patient examined, no change in status, stable for surgery.  I have reviewed the patient's chart and labs.  Questions were answered to the patient's satisfaction.    No changes. Eliquis held since Monday evening.   Virl Cagey

## 2020-07-06 NOTE — Anesthesia Preprocedure Evaluation (Signed)
Anesthesia Evaluation  Patient identified by MRN, date of birth, ID band Patient awake    Reviewed: Allergy & Precautions, NPO status , Patient's Chart, lab work & pertinent test results, reviewed documented beta blocker date and time   History of Anesthesia Complications Negative for: history of anesthetic complications  Airway Mallampati: II  TM Distance: >3 FB Neck ROM: Full    Dental  (+) Dental Advisory Given, Teeth Intact   Pulmonary sleep apnea (as per his freind, stops breathing during sleep) ,    Pulmonary exam normal breath sounds clear to auscultation       Cardiovascular Exercise Tolerance: Good hypertension, Pt. on medications and Pt. on home beta blockers + dysrhythmias Atrial Fibrillation   27-Jan-2020 09:45:27 [facility] ROUTINE RECORD Sinus bradycardia with 1st degree A-V block Otherwise normal ECG Confirmed by Kate Sable [id number]) on 01/27/2020 4:51:52 PM  Left ventricle: The cavity size was normal. Wall thickness was  increased in a pattern of mild LVH. Systolic function was normal.  The estimated ejection fraction was 50%. Diffuse hypokinesis. The  study was not technically sufficient to allow evaluation of LV  diastolic dysfunction due to atrial fibrillation.  - Aortic valve: Mildly calcified annulus. Trileaflet; mildly  calcified leaflets.  - Aortic root: The aortic root was mildly dilated.  - Mitral valve: Mildly calcified annulus. There was mild to  moderate regurgitation.  - Left atrium: The atrium was moderately dilated.  - Right atrium: Central venous pressure (est): 15 mm Hg.  - Atrial septum: No defect or patent foramen ovale was identified.  - Tricuspid valve: There was mild regurgitation.  - Pulmonary arteries: PA peak pressure: 46 mm Hg (S).  - Pericardium, extracardiac: A trivial pericardial effusion was  identified posterior to the heart.    Neuro/Psych PSYCHIATRIC DISORDERS Anxiety Depression negative neurological ROS     GI/Hepatic negative GI ROS, (+)     substance abuse  alcohol use,   Endo/Other  negative endocrine ROS  Renal/GU negative Renal ROS     Musculoskeletal negative musculoskeletal ROS (+)   Abdominal   Peds  Hematology  (+) anemia ,   Anesthesia Other Findings   Reproductive/Obstetrics negative OB ROS                            Anesthesia Physical Anesthesia Plan  ASA: III  Anesthesia Plan: General   Post-op Pain Management:    Induction: Intravenous  PONV Risk Score and Plan: 4 or greater  Airway Management Planned: Oral ETT  Additional Equipment:   Intra-op Plan:   Post-operative Plan: Extubation in OR  Informed Consent: I have reviewed the patients History and Physical, chart, labs and discussed the procedure including the risks, benefits and alternatives for the proposed anesthesia with the patient or authorized representative who has indicated his/her understanding and acceptance.     Dental advisory given  Plan Discussed with: CRNA and Surgeon  Anesthesia Plan Comments:         Anesthesia Quick Evaluation

## 2020-07-06 NOTE — Anesthesia Postprocedure Evaluation (Signed)
Anesthesia Post Note  Patient: MINOR IDEN  Procedure(s) Performed: UMBILICAL HERNIA REPAIR WITH MESH (N/A Abdomen)  Patient location during evaluation: PACU Anesthesia Type: General Level of consciousness: awake, oriented, awake and alert and patient cooperative Pain management: satisfactory to patient Vital Signs Assessment: post-procedure vital signs reviewed and stable Respiratory status: spontaneous breathing, respiratory function stable and nonlabored ventilation Cardiovascular status: stable Postop Assessment: no apparent nausea or vomiting Anesthetic complications: no   No complications documented.   Last Vitals:  Vitals:   07/06/20 0920 07/06/20 1036  BP:  121/89  Pulse: (!) 55 62  Resp: 19 (!) 21  Temp:  (P) 36.6 C  SpO2: 98% 98%    Last Pain:  Vitals:   07/06/20 0749  TempSrc: Oral  PainSc: 0-No pain                 Lauree Yurick

## 2020-07-06 NOTE — Progress Notes (Signed)
[facility]  Notified patient's uncle post op that surgery completed. Eliquis start back 10/2, Saturday. Abdominal binder. Rx sent in. See in 4 weeks.   Curlene Labrum, MD [facility] [address] [place],  [id number] [phone] (office)

## 2020-07-07 ENCOUNTER — Encounter (HOSPITAL_COMMUNITY): Payer: Self-pay | Admitting: General Surgery

## 2020-07-13 ENCOUNTER — Ambulatory Visit: Payer: Medicare HMO | Admitting: Cardiology

## 2020-07-19 ENCOUNTER — Telehealth: Payer: Self-pay | Admitting: Cardiology

## 2020-07-19 NOTE — Telephone Encounter (Signed)
Pt notified that 2 boxes of Eliquis have been placed at front desk for pick up. Lot [id number]  Exp: Jun 2023

## 2020-07-19 NOTE — Telephone Encounter (Signed)
New message     Patient calling the office for samples of medication:   1.  What medication and dosage are you requesting samples for?  apixaban (ELIQUIS) 5 MG TABS tablet  2.  Are you currently out of this medication? Yes  - he doesn't get paid until November 1st when his SSI check comes out

## 2020-08-01 ENCOUNTER — Encounter: Payer: Self-pay | Admitting: Cardiology

## 2020-08-01 ENCOUNTER — Other Ambulatory Visit: Payer: Self-pay

## 2020-08-01 ENCOUNTER — Ambulatory Visit (INDEPENDENT_AMBULATORY_CARE_PROVIDER_SITE_OTHER): Payer: Medicare HMO | Admitting: Cardiology

## 2020-08-01 VITALS — BP 140/78 | HR 62 | Ht 71.0 in | Wt 222.4 lb

## 2020-08-01 DIAGNOSIS — I480 Paroxysmal atrial fibrillation: Secondary | ICD-10-CM | POA: Diagnosis not present

## 2020-08-01 DIAGNOSIS — Z79899 Other long term (current) drug therapy: Secondary | ICD-10-CM | POA: Diagnosis not present

## 2020-08-01 DIAGNOSIS — I340 Nonrheumatic mitral (valve) insufficiency: Secondary | ICD-10-CM

## 2020-08-01 MED ORDER — CHLORTHALIDONE 25 MG PO TABS: 12.5000 mg | ORAL_TABLET | Freq: Every day | ORAL | 3 refills | Status: DC

## 2020-08-01 NOTE — Progress Notes (Addendum)
Clinical Summary Miguel Spencer is a 69 y.o.male seen today for follow up of the following medical problems.  1.PAF - admit with afib 08/2018 after not being able to take meds at home after being arrested - started on dilt gtt for afib with RVR. Restarted on his home toprol and dilt  - previously followed in [place], diagnosed with afib around 2017 - Dr Shirl Harris [place], [place]. - issues with insurance with eliquis and xarelto.Had been on coumadin but stopped at time of his GI bleed 09/2018 Holter: PACs, rare runs of atach up to 3 beats. No symptomsreported    - palpitations improved since cutting back on alcohol - compliant with meds. No bleeding on eliquis.     2. EtOH abuse/Cirrhosis - followed by GI - has not had a drink in 1 month   3. History of GI bleeding - admission to [facility] in [place], [place] with hematemsis and rectal bleeding10/2019while on coumadin -EGD and colonopscopy showed gastritis, polyp - admitted 02/2019 with rectal bleeding while on eliquis and indomethacin for gout - 02/2019 colonoscopy concerning for ischemic colitis however CTA was benign. EGD diffuse mild inflammation, duodenal ulcers without bleeding. was to resume eliquis 03/16/19   4. Mitral regurgitation - mild to mod by 2019 echo - no symptoms    Had covid vaccine x 3 moderna   SH: getting married next wee Nov 2021     Past Medical History:  Diagnosis Date  . A-fib ([place])   . A-fib ([place])   . Alcohol abuse   . Alcoholic ([place])   . Anxiety   . Colon polyp   . Depression   . GI bleed   . Gout   . Hypertension   . Vertigo      No Known Allergies   Current Outpatient Medications  Medication Sig Dispense Refill  . apixaban (ELIQUIS) 5 MG TABS tablet Take 1 tablet (5 mg total) by mouth 2 (two) times daily. 14 tablet 0  . colchicine 0.6 MG tablet Take 0.6 mg by mouth daily as needed (gout flare).    Marland Kitchen diltiazem (DILACOR XR)  180 MG 24 hr capsule Take 180 mg by mouth daily.    Marland Kitchen docusate sodium (COLACE) 100 MG capsule Take 1 capsule (100 mg total) by mouth 2 (two) times daily as needed for mild constipation (while taking narcotic). 60 capsule 0  . ibuprofen (ADVIL) 200 MG tablet Take 800 mg by mouth every 6 (six) hours as needed for headache.    . indomethacin (INDOCIN) 50 MG capsule Take 50 mg by mouth as needed (Gout).     . LORazepam (ATIVAN) 1 MG tablet Take 1 mg by mouth at bedtime as needed for sleep.     . metoprolol succinate (TOPROL-XL) 100 MG 24 hr tablet Take 1 tablet (100 mg total) by mouth daily. Take with or immediately following a meal. 30 tablet 1  . oxyCODONE (ROXICODONE) 5 MG immediate release tablet Take 1 tablet (5 mg total) by mouth every 4 (four) hours as needed for severe pain or breakthrough pain. 20 tablet 0  . pantoprazole (PROTONIX) 40 MG tablet Take 40 mg by mouth daily.    . sildenafil (VIAGRA) 100 MG tablet Take 100 mg by mouth daily as needed for erectile dysfunction.    . tadalafil (CIALIS) 20 MG tablet Take 20 mg by mouth daily as needed for erectile dysfunction.    . valACYclovir (VALTREX) 500 MG tablet Take 500 mg by  mouth 4 (four) times daily as needed (cold sores).     No current facility-administered medications for this visit.     Past Surgical History:  Procedure Laterality Date  . BIOPSY  02/26/2019   Procedure: BIOPSY;  Surgeon: [name], MD;  [facility];  Service: Endoscopy;;  Gastric   . BIOPSY  03/05/2019   Procedure: BIOPSY;  Surgeon: Daneil Dolin, MD;  Location: [facility];  Service: Endoscopy;;  ascending colon bx  . COLONOSCOPY  07/2018   Hobbs piecemeal fashion status post tattooing, 30 x 18 mm sized sigmoid colon polyp removed via snare status post tattooing.  Recommended repeat colonoscopy in 3 months.  Pathology revealed tubular adenoma in the ascending colon, tubulovillous adenoma of the sigmoid colon with areas of high-grade  dysplasia but no carcinoma.  . COLONOSCOPY WITH PROPOFOL N/A 03/05/2019   Procedure: COLONOSCOPY WITH PROPOFOL;  Surgeon: Daneil Dolin, MD;  [facility];  Service: Endoscopy;  Laterality: N/A;  . ESOPHAGOGASTRODUODENOSCOPY (EGD) WITH PROPOFOL N/A 02/26/2019   Dr. Oneida Alar: LA grade D esophagitis, gastritis with benign biopsies, no H. pylori, 5 nonbleeding superficial duodenal ulcers.  Felt to be NSAID related.  Marland Kitchen HERNIA REPAIR     inguinal-not sure which side  . POLYPECTOMY  03/05/2019   Procedure: POLYPECTOMY;  Surgeon: Daneil Dolin, MD;  [facility];  Service: Endoscopy;;  cold snare polypectomy  . UMBILICAL HERNIA REPAIR N/A 07/06/2020   Procedure: UMBILICAL HERNIA REPAIR WITH MESH;  Surgeon: Virl Cagey, MD;  Location: [facility];  Service: General;  Laterality: N/A;     No Known Allergies    Family History  Problem Relation Age of Onset  . Cancer Mother   . Heart disease Father   . Heart disease Brother   . Heart disease Other   . Colon cancer Neg Hx   . Gastric cancer Neg Hx   . Esophageal cancer Neg Hx      Social History Miguel Spencer reports that he has never smoked. He has never used smokeless tobacco. Miguel Spencer reports current alcohol use of about 70.0 standard drinks of alcohol per week.   Review of Systems CONSTITUTIONAL: No weight loss, fever, chills, weakness or fatigue.  HEENT: Eyes: No visual loss, blurred vision, double vision or yellow sclerae.No hearing loss, sneezing, congestion, runny nose or sore throat.  SKIN: No rash or itching.  CARDIOVASCULAR: per hpi RESPIRATORY: No shortness of breath, cough or sputum.  GASTROINTESTINAL: No anorexia, nausea, vomiting or diarrhea. No abdominal pain or blood.  GENITOURINARY: No burning on urination, no polyuria NEUROLOGICAL: No headache, dizziness, syncope, paralysis, ataxia, numbness or tingling in the extremities. No change in bowel or bladder control.  MUSCULOSKELETAL: No muscle,  back pain, joint pain or stiffness.  LYMPHATICS: No enlarged nodes. No history of splenectomy.  PSYCHIATRIC: No history of depression or anxiety.  ENDOCRINOLOGIC: No reports of sweating, cold or heat intolerance. No polyuria or polydipsia.  Marland Kitchen   Physical Examination Today's Vitals   08/01/20 1409  BP: 140/78  Pulse: 62  SpO2: 98%  Weight: 222 lb 6.4 oz (100.9 kg)  Height: 5\' 11"  (1.803 m)   Body mass index is 31.02 kg/m.  Gen: resting comfortably, no acute distress HEENT: no scleral icterus, pupils equal round and reactive, no palptable cervical adenopathy,  CV: RRR, no m/r/g, no jvd Resp: Clear to auscultation bilaterally GI: abdomen is soft, non-tender, non-distended, normal bowel sounds, no hepatosplenomegaly MSK: extremities are warm,  no edema.  Skin: warm, no rash Neuro:  no focal deficits Psych: appropriate affect   Diagnostic Studies  08/2018 echo Study Conclusions  - Left ventricle: The cavity size was normal. Wall thickness was increased in a pattern of mild LVH. Systolic function was normal. The estimated ejection fraction was 50%. Diffuse hypokinesis. The study was not technically sufficient to allow evaluation of LV diastolic dysfunction due to atrial fibrillation. - Aortic valve: Mildly calcified annulus. Trileaflet; mildly calcified leaflets. - Aortic root: The aortic root was mildly dilated. - Mitral valve: Mildly calcified annulus. There was mild to moderate regurgitation. - Left atrium: The atrium was moderately dilated. - Right atrium: Central venous pressure (est): 15 mm Hg. - Atrial septum: No defect or patent foramen ovale was identified. - Tricuspid valve: There was mild regurgitation. - Pulmonary arteries: PA peak pressure: 46 mm Hg (S). - Pericardium, extracardiac: A trivial pericardial effusion was identified posterior to the heart.   09/2018 Holter: PACs, rare runs of atach up to 3 beats. No symptoms   Assessment and  Plan  1.PAF - no symptoms, continue current meds  2. Mitral regurgitation -mild to moderate by 2019 echo - we will repeat echo last 2022  3. HTN - above goal, start chlorhtalidone 12.5mg  daily, check BMET/mg in 2 weeks  Arnoldo Lenis, M.D.

## 2020-08-01 NOTE — Patient Instructions (Addendum)
Medication Instructions:  START Chlorthalidone 12.5 mg daily    *If you need a refill on your cardiac medications before your next appointment, please call your pharmacy*   Lab Work: BMET,magnesium in 2 weeks (11/10)   If you have labs (blood work) drawn today and your tests are completely normal, you will receive your results only by: Marland Kitchen MyChart Message (if you have MyChart) OR . A paper copy in the mail If you have any lab test that is abnormal or we need to change your treatment, we will call you to review the results.   Testing/Procedures: None today   Follow-Up: At [facility], you and your health needs are our priority.  As part of our continuing mission to provide you with exceptional heart care, we have created designated Provider Care Teams.  These Care Teams include your primary Cardiologist (physician) and Advanced Practice Providers (APPs -  Physician Assistants and Nurse Practitioners) who all work together to provide you with the care you need, when you need it.  We recommend signing up for the patient portal called "MyChart".  Sign up information is provided on this After Visit Summary.  MyChart is used to connect with patients for Virtual Visits (Telemedicine).  Patients are able to view lab/test results, encounter notes, upcoming appointments, etc.  Non-urgent messages can be sent to your provider as well.   To learn more about what you can do with MyChart, go to [email or web address].    Your next appointment:   6 month(s)  The format for your next appointment:   In Person  Provider:   Carlyle Dolly, MD   Other Instructions Samples Eliquis 5 mg #42, lot [id number], exp 03/2022  DASH Eating Plan DASH stands for "Dietary Approaches to Stop Hypertension." The DASH eating plan is a healthy eating plan that has been shown to reduce high blood pressure (hypertension). It may also reduce your risk for type 2 diabetes, heart disease, and stroke. The DASH eating  plan may also help with weight loss. What are tips for following this plan?  General guidelines  Avoid eating more than 2,300 mg (milligrams) of salt (sodium) a day. If you have hypertension, you may need to reduce your sodium intake to 1,500 mg a day.  Limit alcohol intake to no more than 1 drink a day for nonpregnant women and 2 drinks a day for men. One drink equals 12 oz of beer, 5 oz of wine, or 1 oz of hard liquor.  Work with your health care provider to maintain a healthy body weight or to lose weight. Ask what an ideal weight is for you.  Get at least 30 minutes of exercise that causes your heart to beat faster (aerobic exercise) most days of the week. Activities may include walking, swimming, or biking.  Work with your health care provider or diet and nutrition specialist (dietitian) to adjust your eating plan to your individual calorie needs. Reading food labels   Check food labels for the amount of sodium per serving. Choose foods with less than 5 percent of the Daily Value of sodium. Generally, foods with less than 300 mg of sodium per serving fit into this eating plan.  To find whole grains, look for the word "whole" as the first word in the ingredient list. Shopping  Buy products labeled as "low-sodium" or "no salt added."  Buy fresh foods. Avoid canned foods and premade or frozen meals. Cooking  Avoid adding salt when cooking. Use salt-free seasonings or herbs instead  of table salt or sea salt. Check with your health care provider or pharmacist before using salt substitutes.  Do not fry foods. Cook foods using healthy methods such as baking, boiling, grilling, and broiling instead.  Cook with heart-healthy oils, such as olive, canola, soybean, or sunflower oil. Meal planning  Eat a balanced diet that includes: ? 5 or more servings of fruits and vegetables each day. At each meal, try to fill half of your plate with fruits and vegetables. ? Up to 6-8 servings of  whole grains each day. ? Less than 6 oz of lean meat, poultry, or fish each day. A 3-oz serving of meat is about the same size as a deck of cards. One egg equals 1 oz. ? 2 servings of low-fat dairy each day. ? A serving of nuts, seeds, or beans 5 times each week. ? Heart-healthy fats. Healthy fats called Omega-3 fatty acids are found in foods such as flaxseeds and coldwater fish, like sardines, salmon, and mackerel.  Limit how much you eat of the following: ? Canned or prepackaged foods. ? Food that is high in trans fat, such as fried foods. ? Food that is high in saturated fat, such as fatty meat. ? Sweets, desserts, sugary drinks, and other foods with added sugar. ? Full-fat dairy products.  Do not salt foods before eating.  Try to eat at least 2 vegetarian meals each week.  Eat more home-cooked food and less restaurant, buffet, and fast food.  When eating at a restaurant, ask that your food be prepared with less salt or no salt, if possible. What foods are recommended? The items listed may not be a complete list. Talk with your dietitian about what dietary choices are best for you. Grains Whole-grain or whole-wheat bread. Whole-grain or whole-wheat pasta. Brown rice. [place]. Bulgur. Whole-grain and low-sodium cereals. Pita bread. Low-fat, low-sodium crackers. Whole-wheat flour tortillas. Vegetables Fresh or frozen vegetables (raw, steamed, roasted, or grilled). Low-sodium or reduced-sodium tomato and vegetable juice. Low-sodium or reduced-sodium tomato sauce and tomato paste. Low-sodium or reduced-sodium canned vegetables. Fruits All fresh, dried, or frozen fruit. Canned fruit in natural juice (without added sugar). Meat and other protein foods Skinless chicken or [place]. Ground chicken or [place]. Pork with fat trimmed off. Fish and seafood. Egg whites. Dried beans, peas, or lentils. Unsalted nuts, nut butters, and seeds. Unsalted canned beans. Lean cuts of beef with fat  trimmed off. Low-sodium, lean deli meat. Dairy Low-fat (1%) or fat-free (skim) milk. Fat-free, low-fat, or reduced-fat cheeses. Nonfat, low-sodium ricotta or cottage cheese. Low-fat or nonfat yogurt. Low-fat, low-sodium cheese. Fats and oils Soft margarine without trans fats. Vegetable oil. Low-fat, reduced-fat, or light mayonnaise and salad dressings (reduced-sodium). Canola, safflower, olive, soybean, and sunflower oils. Avocado. Seasoning and other foods Herbs. Spices. Seasoning mixes without salt. Unsalted popcorn and pretzels. Fat-free sweets. What foods are not recommended? The items listed may not be a complete list. Talk with your dietitian about what dietary choices are best for you. Grains Baked goods made with fat, such as croissants, muffins, or some breads. Dry pasta or rice meal packs. Vegetables Creamed or fried vegetables. Vegetables in a cheese sauce. Regular canned vegetables (not low-sodium or reduced-sodium). Regular canned tomato sauce and paste (not low-sodium or reduced-sodium). Regular tomato and vegetable juice (not low-sodium or reduced-sodium). Angie Fava. Olives. Fruits Canned fruit in a light or heavy syrup. Fried fruit. Fruit in cream or butter sauce. Meat and other protein foods Fatty cuts of meat. Ribs. Fried meat.  Berniece Salines. Sausage. Bologna and other processed lunch meats. Salami. Fatback. Hotdogs. Bratwurst. Salted nuts and seeds. Canned beans with added salt. Canned or smoked fish. Whole eggs or egg yolks. Chicken or [place] with skin. Dairy Whole or 2% milk, cream, and half-and-half. Whole or full-fat cream cheese. Whole-fat or sweetened yogurt. Full-fat cheese. Nondairy creamers. Whipped toppings. Processed cheese and cheese spreads. Fats and oils Butter. Stick margarine. Lard. Shortening. Ghee. Bacon fat. Tropical oils, such as coconut, palm kernel, or palm oil. Seasoning and other foods Salted popcorn and pretzels. Onion salt, garlic salt, seasoned salt, table  salt, and sea salt. Worcestershire sauce. Tartar sauce. Barbecue sauce. Teriyaki sauce. Soy sauce, including reduced-sodium. Steak sauce. Canned and packaged gravies. Fish sauce. Oyster sauce. Cocktail sauce. Horseradish that you find on the shelf. Ketchup. Mustard. Meat flavorings and tenderizers. Bouillon cubes. Hot sauce and Tabasco sauce. Premade or packaged marinades. Premade or packaged taco seasonings. Relishes. Regular salad dressings. Where to find more information:  National Heart, Lung, and [place]: https://wilson-eaton.com/  American Heart Association: www.heart.org Summary  The DASH eating plan is a healthy eating plan that has been shown to reduce high blood pressure (hypertension). It may also reduce your risk for type 2 diabetes, heart disease, and stroke.  With the DASH eating plan, you should limit salt (sodium) intake to 2,300 mg a day. If you have hypertension, you may need to reduce your sodium intake to 1,500 mg a day.  When on the DASH eating plan, aim to eat more fresh fruits and vegetables, whole grains, lean proteins, low-fat dairy, and heart-healthy fats.  Work with your health care provider or diet and nutrition specialist (dietitian) to adjust your eating plan to your individual calorie needs. This information is not intended to replace advice given to you by your health care provider. Make sure you discuss any questions you have with your health care provider. Document Revised: 09/05/2017 Document Reviewed: 09/16/2016 Elsevier Patient Education  2020 [employer].

## 2020-08-03 ENCOUNTER — Encounter: Payer: Self-pay | Admitting: General Surgery

## 2020-08-03 ENCOUNTER — Other Ambulatory Visit: Payer: Self-pay

## 2020-08-03 ENCOUNTER — Ambulatory Visit (INDEPENDENT_AMBULATORY_CARE_PROVIDER_SITE_OTHER): Payer: Medicare HMO | Admitting: General Surgery

## 2020-08-03 VITALS — BP 144/92 | HR 56 | Temp 97.9°F | Resp 14 | Ht 71.0 in | Wt 222.0 lb

## 2020-08-03 DIAGNOSIS — K429 Umbilical hernia without obstruction or gangrene: Secondary | ICD-10-CM

## 2020-08-03 NOTE — Patient Instructions (Signed)
Would wait until 6- 8 weeks post op before any lifting > 10 lbs or abdominal exercises. Start back slowly.

## 2020-08-03 NOTE — Progress Notes (Signed)
[facility] Note   HPI:  69 y.o. Male presents to clinic for post-op follow-up evaluation of his umbilical hernia repair. Patient reports doing well.  Review of Systems:  No pain All other review of systems: otherwise negative   Vital Signs:  BP (!) 144/92   Pulse (!) 56   Temp 97.9 F (36.6 C) (Other (Comment))   Resp 14   Ht 5\' 11"  (1.803 m)   Wt 222 lb (100.7 kg)   SpO2 96%   BMI 30.96 kg/m    Physical Exam:  Physical Exam Vitals reviewed.  Cardiovascular:     Rate and Rhythm: Normal rate.  Pulmonary:     Effort: Pulmonary effort is normal.  Abdominal:     General: There is no distension.     Palpations: Abdomen is soft.     Tenderness: There is no abdominal tenderness.     Hernia: No hernia is present.     Comments: Incision healing, no erythema or drainage  Skin:    General: Skin is warm.  Neurological:     General: No focal deficit present.     Mental Status: He is oriented to person, place, and time.     Assessment:  69 y.o. yo Male s/p umbilical hernia repair with mesh. Doing well.  Plan:  Would wait until 6- 8 weeks post op before any lifting > 10 lbs or abdominal exercises. Start back slowly.    Curlene Labrum, MD [facility] [address] [place], [place] [address] [phone] (office)

## 2020-09-06 DIAGNOSIS — F1010 Alcohol abuse, uncomplicated: Secondary | ICD-10-CM | POA: Diagnosis not present

## 2020-09-06 DIAGNOSIS — K2921 Alcoholic gastritis with bleeding: Secondary | ICD-10-CM | POA: Diagnosis not present

## 2020-09-06 DIAGNOSIS — K588 Other irritable bowel syndrome: Secondary | ICD-10-CM | POA: Diagnosis not present

## 2020-09-06 DIAGNOSIS — D649 Anemia, unspecified: Secondary | ICD-10-CM | POA: Diagnosis not present

## 2020-09-06 DIAGNOSIS — G4700 Insomnia, unspecified: Secondary | ICD-10-CM | POA: Diagnosis not present

## 2020-09-06 DIAGNOSIS — I480 Paroxysmal atrial fibrillation: Secondary | ICD-10-CM | POA: Diagnosis not present

## 2020-09-06 DIAGNOSIS — E782 Mixed hyperlipidemia: Secondary | ICD-10-CM | POA: Diagnosis not present

## 2020-09-06 DIAGNOSIS — E871 Hypo-osmolality and hyponatremia: Secondary | ICD-10-CM | POA: Diagnosis not present

## 2020-09-06 DIAGNOSIS — M791 Myalgia, unspecified site: Secondary | ICD-10-CM | POA: Diagnosis not present

## 2020-09-26 ENCOUNTER — Telehealth: Payer: Self-pay | Admitting: Cardiology

## 2020-09-26 NOTE — Telephone Encounter (Signed)
Pt notified that 2 sample boxes ( Lot # [id number], Exp: 04/2022) placed at front desk for pick up.

## 2020-09-26 NOTE — Telephone Encounter (Signed)
New message    Patient calling the office for samples of medication:   1.  What medication and dosage are you requesting samples for? eliquis  2.  Are you currently out of this medication?  Yes and he is in the donut hole

## 2020-10-06 DIAGNOSIS — E782 Mixed hyperlipidemia: Secondary | ICD-10-CM | POA: Diagnosis not present

## 2020-10-06 DIAGNOSIS — D649 Anemia, unspecified: Secondary | ICD-10-CM | POA: Diagnosis not present

## 2020-10-06 DIAGNOSIS — K2921 Alcoholic gastritis with bleeding: Secondary | ICD-10-CM | POA: Diagnosis not present

## 2020-10-06 DIAGNOSIS — G4700 Insomnia, unspecified: Secondary | ICD-10-CM | POA: Diagnosis not present

## 2020-10-06 DIAGNOSIS — E871 Hypo-osmolality and hyponatremia: Secondary | ICD-10-CM | POA: Diagnosis not present

## 2020-10-06 DIAGNOSIS — M791 Myalgia, unspecified site: Secondary | ICD-10-CM | POA: Diagnosis not present

## 2020-10-06 DIAGNOSIS — I480 Paroxysmal atrial fibrillation: Secondary | ICD-10-CM | POA: Diagnosis not present

## 2020-10-06 DIAGNOSIS — K588 Other irritable bowel syndrome: Secondary | ICD-10-CM | POA: Diagnosis not present

## 2020-10-06 DIAGNOSIS — F1010 Alcohol abuse, uncomplicated: Secondary | ICD-10-CM | POA: Diagnosis not present

## 2020-10-29 IMAGING — CT CT ABDOMEN AND PELVIS WITH CONTRAST
2 of 5 series · 16 of 46 positions shown, 18 images · IV contrast (Isovue)
Comparison: Abdominal ultrasound dated December 23, 2018. CT abdomen
pelvis dated January 20, 2013.

CLINICAL DATA: Rectal bleeding with nausea and vomiting for the
past 4 days. History of gastric ulcer and alcohol abuse.

EXAM:
CT ABDOMEN AND PELVIS WITH CONTRAST
TECHNIQUE: Multidetector CT imaging of the abdomen and pelvis was performed
using the standard protocol following bolus administration of
intravenous contrast.
CONTRAST:  100mL OMNIPAQUE IOHEXOL 300 MG/ML  SOLN

[Series 2: axial st · axial · 0.87mm/px · z∈[+378,+853]mm · 13 of 111 slices shown, 15 images]
[im 8/111  soft-tissue]
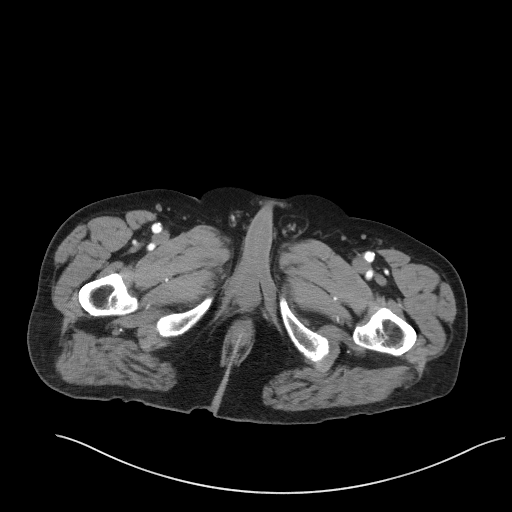
[im 8/111  bone]
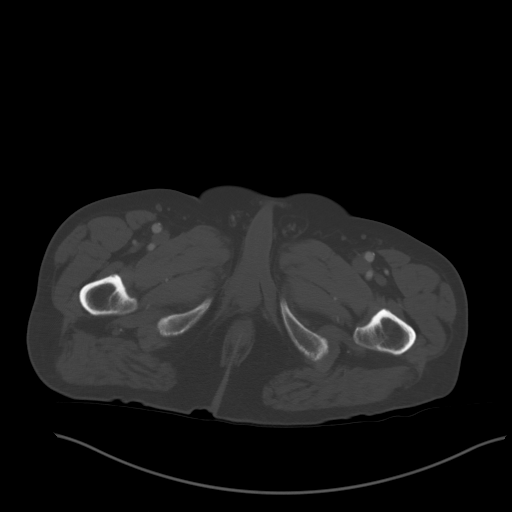
[im 16/111  soft-tissue]
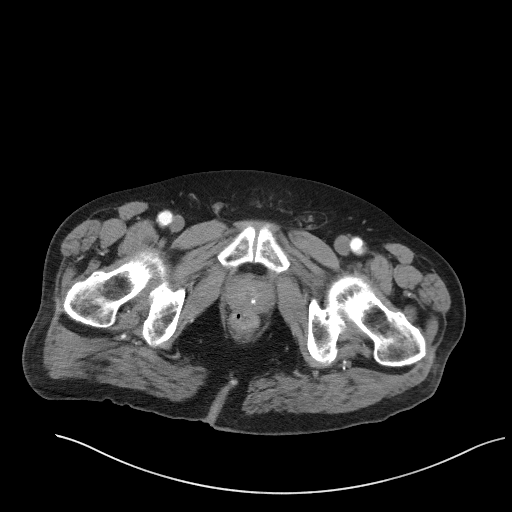
[im 24/111  soft-tissue]
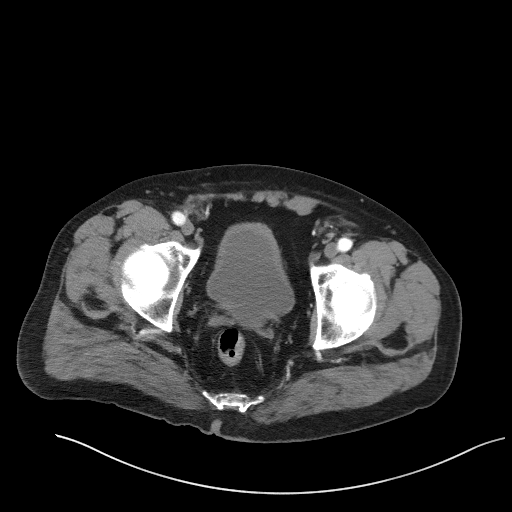
[im 32/111  soft-tissue]
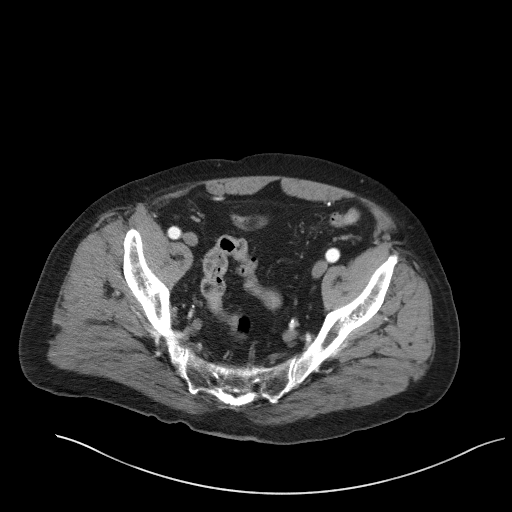
[im 40/111  soft-tissue]
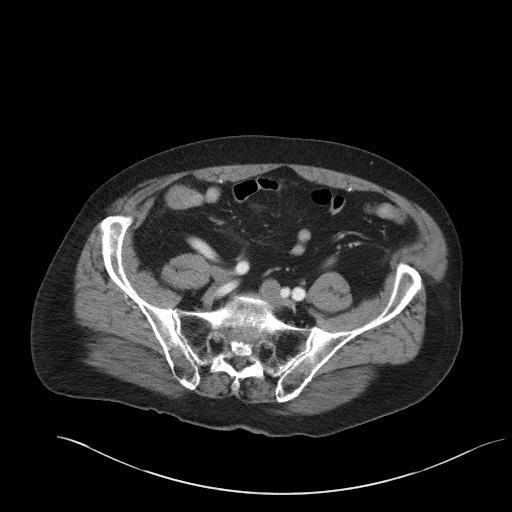
[im 48/111  soft-tissue]
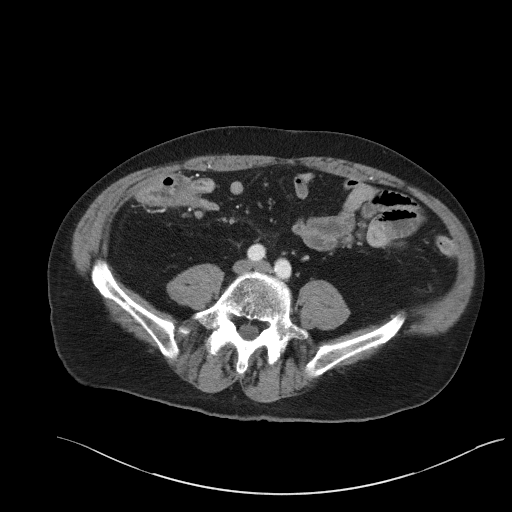
[im 56/111  soft-tissue]
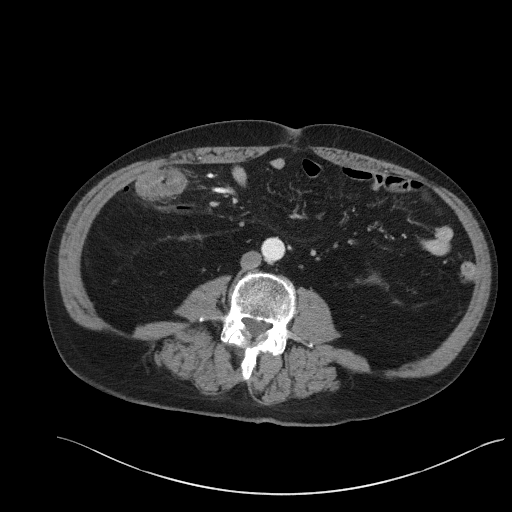
[im 63/111  soft-tissue]
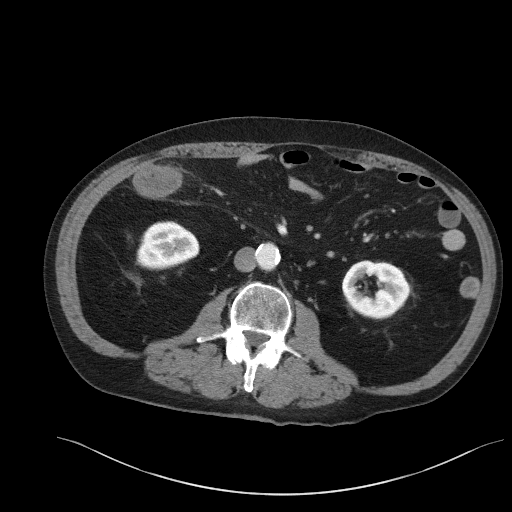
[im 71/111  soft-tissue]
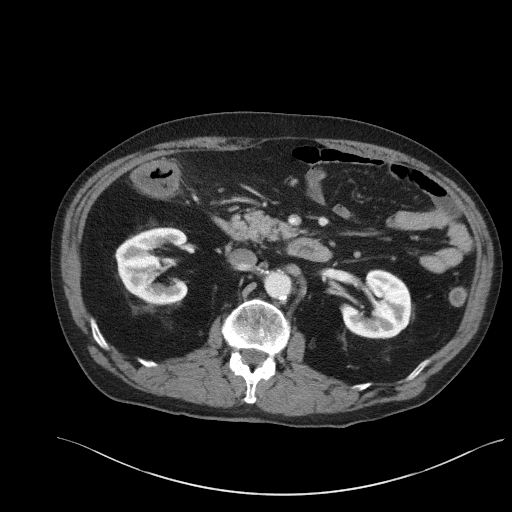
[im 71/111  bone]
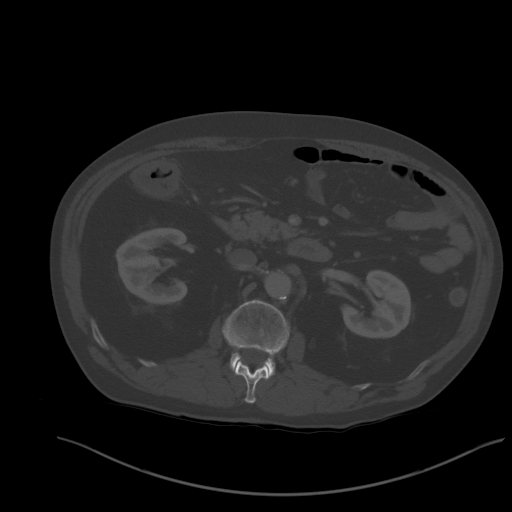
[im 79/111  soft-tissue]
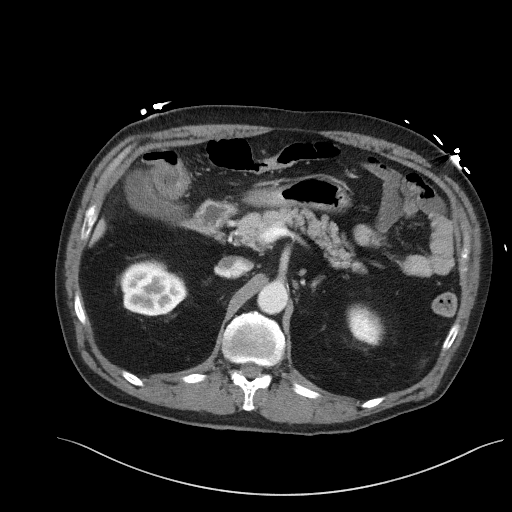
[im 87/111  soft-tissue]
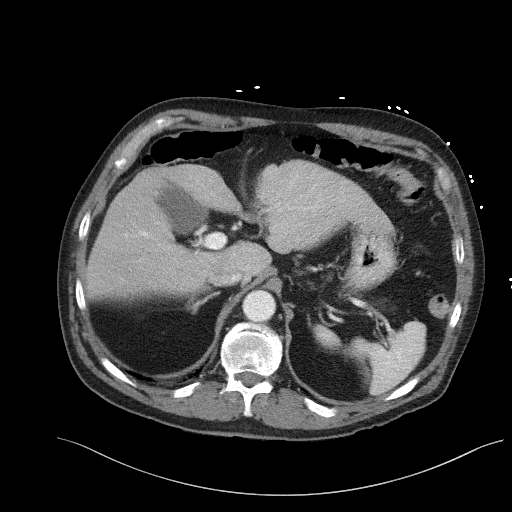
[im 95/111  soft-tissue]
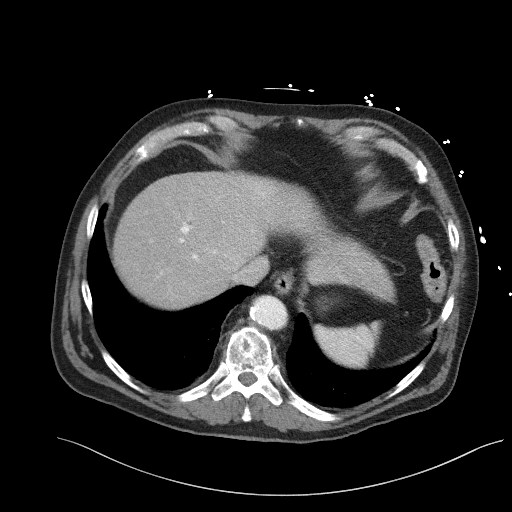
[im 103/111  soft-tissue]
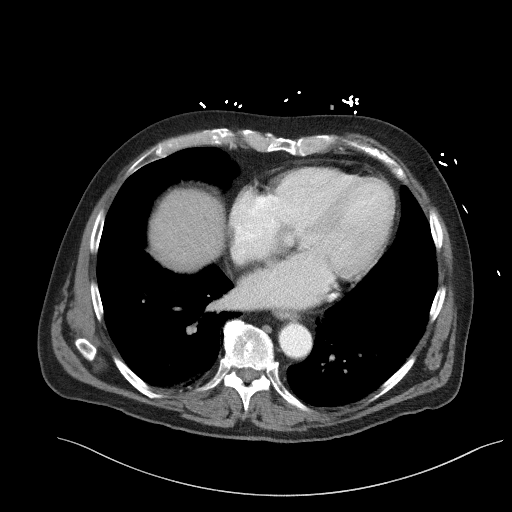

[Series 6: coronal st · coronal · 0.96mm/px · 3 of 105 slices shown]
[im 35/105  soft-tissue]
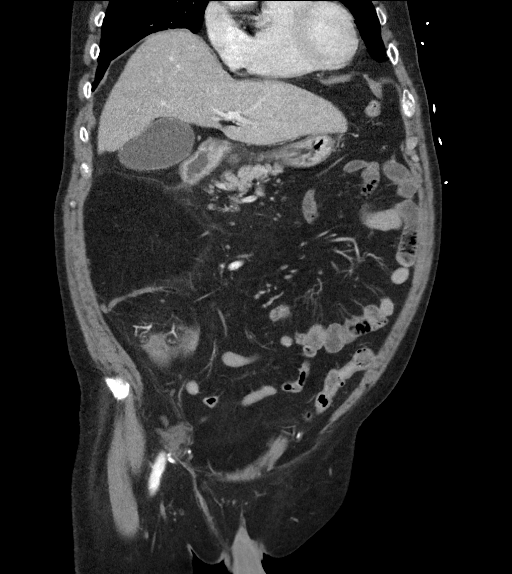
[im 47/105  soft-tissue]
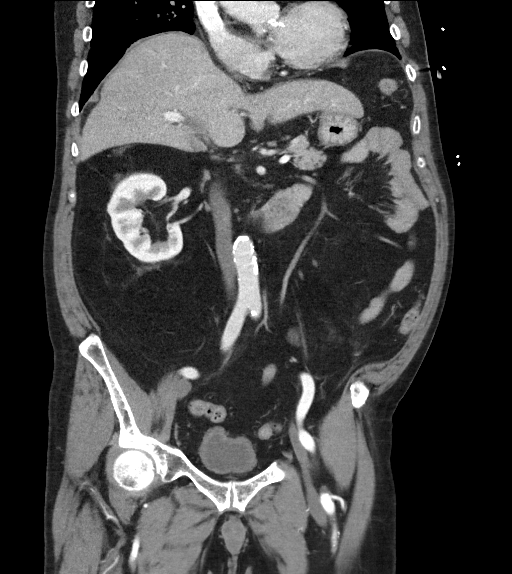
[im 58/105  soft-tissue]
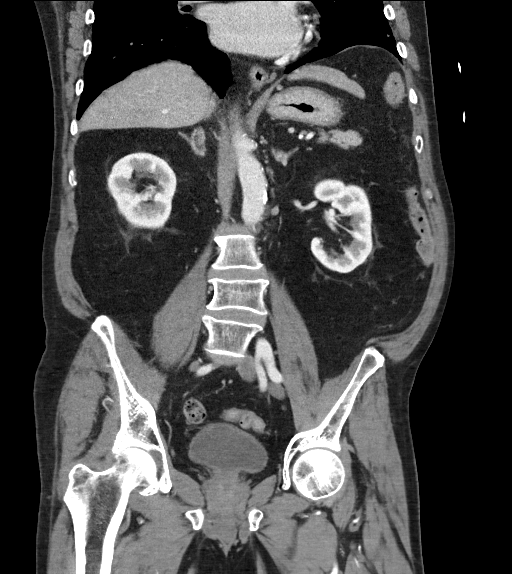

[16 of 46 positions shown; findings below may reference images not displayed]

FINDINGS: Lower chest: No acute abnormality.

Hepatobiliary: No focal liver abnormality is seen. No gallstones,
gallbladder wall thickening, or biliary dilatation.

Pancreas: Unremarkable. No pancreatic ductal dilatation or
surrounding inflammatory changes.

Spleen: Normal in size without focal abnormality.

Adrenals/Urinary Tract: Adrenal glands are unremarkable. Kidneys are
normal, without renal calculi, focal lesion, or hydronephrosis.
Bladder is unremarkable for the degree of distention.

Stomach/Bowel: Moderate circumferential wall thickening of the cecum
and ascending colon with mild surrounding inflammatory stranding.
The remaining colon is unremarkable. The stomach and small bowel are
within normal limits. No obstruction. Normal appendix.

Vascular/Lymphatic: Aortic atherosclerosis. No enlarged abdominal or
pelvic lymph nodes.

Reproductive: Prostate is unremarkable.

Other: Unchanged small fat containing umbilical hernia. No free
fluid or pneumoperitoneum.

Musculoskeletal: No acute or significant osseous findings. Prominent
Schmorl's nodes involving the T10 and T11 superior endplates.
IMPRESSION: 1. Acute colitis involving the cecum and ascending colon.
2.  Aortic atherosclerosis ([id number]).

## 2020-11-04 DIAGNOSIS — M791 Myalgia, unspecified site: Secondary | ICD-10-CM | POA: Diagnosis not present

## 2020-11-04 DIAGNOSIS — K2921 Alcoholic gastritis with bleeding: Secondary | ICD-10-CM | POA: Diagnosis not present

## 2020-11-04 DIAGNOSIS — G4700 Insomnia, unspecified: Secondary | ICD-10-CM | POA: Diagnosis not present

## 2020-11-04 DIAGNOSIS — I480 Paroxysmal atrial fibrillation: Secondary | ICD-10-CM | POA: Diagnosis not present

## 2020-11-04 DIAGNOSIS — K588 Other irritable bowel syndrome: Secondary | ICD-10-CM | POA: Diagnosis not present

## 2020-11-04 DIAGNOSIS — D649 Anemia, unspecified: Secondary | ICD-10-CM | POA: Diagnosis not present

## 2020-11-04 DIAGNOSIS — E782 Mixed hyperlipidemia: Secondary | ICD-10-CM | POA: Diagnosis not present

## 2020-11-04 DIAGNOSIS — F1010 Alcohol abuse, uncomplicated: Secondary | ICD-10-CM | POA: Diagnosis not present

## 2020-11-04 DIAGNOSIS — E871 Hypo-osmolality and hyponatremia: Secondary | ICD-10-CM | POA: Diagnosis not present

## 2021-02-04 DIAGNOSIS — K219 Gastro-esophageal reflux disease without esophagitis: Secondary | ICD-10-CM | POA: Diagnosis not present

## 2021-02-04 DIAGNOSIS — I10 Essential (primary) hypertension: Secondary | ICD-10-CM | POA: Diagnosis not present

## 2021-02-07 ENCOUNTER — Ambulatory Visit: Payer: Medicare HMO | Admitting: Cardiology

## 2021-03-08 DIAGNOSIS — E782 Mixed hyperlipidemia: Secondary | ICD-10-CM | POA: Diagnosis not present

## 2021-03-20 DIAGNOSIS — I10 Essential (primary) hypertension: Secondary | ICD-10-CM | POA: Diagnosis not present

## 2021-03-20 DIAGNOSIS — E1165 Type 2 diabetes mellitus with hyperglycemia: Secondary | ICD-10-CM | POA: Diagnosis not present

## 2021-04-25 DIAGNOSIS — D509 Iron deficiency anemia, unspecified: Secondary | ICD-10-CM | POA: Diagnosis not present

## 2021-04-25 DIAGNOSIS — E782 Mixed hyperlipidemia: Secondary | ICD-10-CM | POA: Diagnosis not present

## 2021-04-25 DIAGNOSIS — K589 Irritable bowel syndrome without diarrhea: Secondary | ICD-10-CM | POA: Diagnosis not present

## 2021-04-25 DIAGNOSIS — F10988 Alcohol use, unspecified with other alcohol-induced disorder: Secondary | ICD-10-CM | POA: Diagnosis not present

## 2021-04-25 DIAGNOSIS — M791 Myalgia, unspecified site: Secondary | ICD-10-CM | POA: Diagnosis not present

## 2021-04-25 DIAGNOSIS — Z0001 Encounter for general adult medical examination with abnormal findings: Secondary | ICD-10-CM | POA: Diagnosis not present

## 2021-04-25 DIAGNOSIS — K2920 Alcoholic gastritis without bleeding: Secondary | ICD-10-CM | POA: Diagnosis not present

## 2021-04-25 DIAGNOSIS — I480 Paroxysmal atrial fibrillation: Secondary | ICD-10-CM | POA: Diagnosis not present

## 2021-04-25 DIAGNOSIS — G4700 Insomnia, unspecified: Secondary | ICD-10-CM | POA: Diagnosis not present

## 2021-05-03 ENCOUNTER — Encounter: Payer: Self-pay | Admitting: Cardiology

## 2021-05-03 ENCOUNTER — Ambulatory Visit (INDEPENDENT_AMBULATORY_CARE_PROVIDER_SITE_OTHER): Payer: Medicare HMO | Admitting: Cardiology

## 2021-05-03 ENCOUNTER — Other Ambulatory Visit: Payer: Self-pay

## 2021-05-03 ENCOUNTER — Encounter: Payer: Self-pay | Admitting: *Deleted

## 2021-05-03 VITALS — BP 134/78 | HR 63 | Ht 71.0 in | Wt 219.0 lb

## 2021-05-03 DIAGNOSIS — I480 Paroxysmal atrial fibrillation: Secondary | ICD-10-CM

## 2021-05-03 DIAGNOSIS — I340 Nonrheumatic mitral (valve) insufficiency: Secondary | ICD-10-CM | POA: Diagnosis not present

## 2021-05-03 DIAGNOSIS — I10 Essential (primary) hypertension: Secondary | ICD-10-CM

## 2021-05-03 NOTE — Progress Notes (Signed)
Clinical Summary Miguel Spencer is a 70 y.o.male seen today for follow up of the following medical problems.    1. PAF - admit with afib 08/2018 after not being able to take meds at home after being arrested - started on dilt gtt for afib with RVR. Restarted on his home toprol and dilt   - previously followed in [place], diagnosed with afib around 2017 - Dr Shirl Harris [place], [place]. - issues with insurance with eliquis and xarelto. Had been on coumadin but stopped at time of his GI bleed  09/2018 Holter: PACs, rare runs of atach up to 3 beats. No symptoms reported      - can have palpitations at times with EtOH use, but overall infrequent. Overall symptoms are tolerable.  - no recent bleeding on eliquis.        2. EtOH abuse/Cirrhosis - followed by GI - has not had a drink in 1 month      3. History of GI bleeding - admission to [facility] in [place], [place] with hematemsis and rectal bleeding 07/2018 while on coumadin - EGD and colonopscopy showed gastritis, polyp  - admitted 02/2019 with rectal bleeding while on eliquis and indomethacin for gout - 02/2019 colonoscopy concerning for ischemic colitis however CTA was benign. EGD diffuse mild inflammation, duodenal ulcers without bleeding. was to resume eliquis 03/16/19     4. Mitral regurgitation - mild to mod by 2019 echo -denies any recent symptoms   5. HTN - side effects on chlorthaldone, tried for 3 months. Causes some dizziness, felt "out of sorts" - he has cut back his salt.   6. Erectile dysfunction - not responding to cialis or viagra.  - he is seen by a urologist.  - uncler if could be a medication side effect, perhaps beta blocker    Past Medical History:  Diagnosis Date   A-fib ([place])    A-fib ([place])    Alcohol abuse    Alcoholic ([place])    Anxiety    Colon polyp    Depression    GI bleed    Gout    Hypertension    Vertigo      No Known Allergies   Current Outpatient  Medications  Medication Sig Dispense Refill   apixaban (ELIQUIS) 5 MG TABS tablet Take 1 tablet (5 mg total) by mouth 2 (two) times daily. 14 tablet 0   chlorthalidone (HYGROTON) 25 MG tablet Take 0.5 tablets (12.5 mg total) by mouth daily. 45 tablet 3   colchicine 0.6 MG tablet Take 0.6 mg by mouth daily as needed (gout flare). (Patient not taking: Reported on 08/01/2020)     diltiazem (DILACOR XR) 180 MG 24 hr capsule Take 180 mg by mouth daily.     ibuprofen (ADVIL) 200 MG tablet Take 800 mg by mouth every 6 (six) hours as needed for headache.     indomethacin (INDOCIN) 50 MG capsule Take 50 mg by mouth as needed (Gout).      LORazepam (ATIVAN) 1 MG tablet Take 1 mg by mouth at bedtime as needed for sleep.      metoprolol succinate (TOPROL-XL) 100 MG 24 hr tablet Take 1 tablet (100 mg total) by mouth daily. Take with or immediately following a meal. 30 tablet 1   pantoprazole (PROTONIX) 40 MG tablet Take 40 mg by mouth daily.     valACYclovir (VALTREX) 500 MG tablet Take 500 mg by mouth 4 (four) times daily as needed (cold  sores).     No current facility-administered medications for this visit.     Past Surgical History:  Procedure Laterality Date   BIOPSY  02/26/2019   Procedure: BIOPSY;  Surgeon: [name], MD;  Location[facility];  Service: Endoscopy;;  Gastric    BIOPSY  03/05/2019   Procedure: BIOPSY;  Surgeon: Daneil Dolin, MD;  Location: [facility];  Service: Endoscopy;;  ascending colon bx   COLONOSCOPY  07/2018   [place] piecemeal fashion status post tattooing, 30 x 18 mm sized sigmoid colon polyp removed via snare status post tattooing.  Recommended repeat colonoscopy in 3 months.  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   COLONOSCOPY WITH PROPOFOL N/A 03/05/2019   Procedure: COLONOSCOPY WITH PROPOFOL;  Surgeon: Daneil Dolin, MD;  Location: [facility];  Service: Endoscopy;  Laterality: N/A;   ESOPHAGOGASTRODUODENOSCOPY (EGD) WITH PROPOFOL N/A 02/26/2019   Dr. Oneida Alar: LA grade D esophagitis, gastritis with benign biopsies, no H. pylori, 5 nonbleeding superficial duodenal ulcers.  Felt to be NSAID related.   HERNIA REPAIR     inguinal-not sure which side   POLYPECTOMY  03/05/2019   Procedure: POLYPECTOMY;  Surgeon: Daneil Dolin, MD;  Location: [facility];  Service: Endoscopy;;  cold snare polypectomy   UMBILICAL HERNIA REPAIR N/A 07/06/2020   Procedure: UMBILICAL HERNIA REPAIR WITH MESH;  Surgeon: Virl Cagey, MD;  Location: [facility];  Service: General;  Laterality: N/A;     No Known Allergies    Family History  Problem Relation Age of Onset   Cancer Mother    Heart disease Father    Heart disease Brother    Heart disease Other    Colon cancer Neg Hx    Gastric cancer Neg Hx    Esophageal cancer Neg Hx      Social History Miguel Spencer reports that he has never smoked. He has never used smokeless tobacco. Miguel Spencer reports current alcohol use of about 70.0 standard drinks of alcohol per week.   Review of Systems CONSTITUTIONAL: No weight loss, fever, chills, weakness or fatigue.  HEENT: Eyes: No visual loss, blurred vision, double vision or yellow sclerae.No hearing loss, sneezing, congestion, runny nose or sore throat.  SKIN: No rash or itching.  CARDIOVASCULAR: per hpi RESPIRATORY: No shortness of breath, cough or sputum.  GASTROINTESTINAL: No anorexia, nausea, vomiting or diarrhea. No abdominal pain or blood.  GENITOURINARY: +erectile dysfunction NEUROLOGICAL: No headache, dizziness, syncope, paralysis, ataxia, numbness or tingling in the extremities. No change in bowel or bladder control.  MUSCULOSKELETAL: No muscle, back pain, joint pain or stiffness.  LYMPHATICS: No enlarged nodes. No history of splenectomy.  PSYCHIATRIC: No history of depression or anxiety.  ENDOCRINOLOGIC: No reports of  sweating, cold or heat intolerance. No polyuria or polydipsia.  Marland Kitchen   Physical Examination Today's Vitals   05/03/21 1337  BP: 134/78  Pulse: 63  SpO2: 98%  Weight: 219 lb (99.3 kg)  Height: '5\' 11"'$  (1.803 m)   Body mass index is 30.54 kg/m.  Gen: resting comfortably, no acute distress HEENT: no scleral icterus, pupils equal round and reactive, no palptable cervical adenopathy,  CV: irreg, no m/r/g,n o jvd Resp: Clear to auscultation bilaterally GI: abdomen is soft, non-tender, non-distended, normal bowel sounds, no hepatosplenomegaly MSK: extremities are warm, no edema.  Skin: warm, no rash Neuro:  no focal deficits Psych: appropriate  affect   Diagnostic Studies  08/2018 echo Study Conclusions   - Left ventricle: The cavity size was normal. Wall thickness was   increased in a pattern of mild LVH. Systolic function was normal.   The estimated ejection fraction was 50%. Diffuse hypokinesis. The   study was not technically sufficient to allow evaluation of LV   diastolic dysfunction due to atrial fibrillation. - Aortic valve: Mildly calcified annulus. Trileaflet; mildly   calcified leaflets. - Aortic root: The aortic root was mildly dilated. - Mitral valve: Mildly calcified annulus. There was mild to   moderate regurgitation. - Left atrium: The atrium was moderately dilated. - Right atrium: Central venous pressure (est): 15 mm Hg. - Atrial septum: No defect or patent foramen ovale was identified. - Tricuspid valve: There was mild regurgitation. - Pulmonary arteries: PA peak pressure: 46 mm Hg (S). - Pericardium, extracardiac: A trivial pericardial effusion was   identified posterior to the heart.     09/2018 Holter: PACs, rare runs of atach up to 3 beats. No symptoms     Assessment and Plan  1. PAF - overall symptoms controlled - continue current medications including eliquis - unclear if toprol causing erectile dysfunction, may consider uptitrating ditl and  downtrating toprol at follow up if ongoing issue, he has a urology appt coming up.    2. Mitral regurgitation -mild to moderate by 2019 echo - repeat echo after next f/u   3. HTN -side effects on cholrthalidone, he stopped taking - bp's reasonable today - if trend back up would consider ARB.      Arnoldo Lenis, M.D.

## 2021-05-03 NOTE — Patient Instructions (Signed)
Medication Instructions:  Your physician recommends that you continue on your current medications as directed. Please refer to the Current Medication list given to you today.  *If you need a refill on your cardiac medications before your next appointment, please call your pharmacy*   Lab Work: NONE   If you have labs (blood work) drawn today and your tests are completely normal, you will receive your results only by: . MyChart Message (if you have MyChart) OR . A paper copy in the mail If you have any lab test that is abnormal or we need to change your treatment, we will call you to review the results.   Testing/Procedures: NONE    Follow-Up: At [facility], you and your health needs are our priority.  As part of our continuing mission to provide you with exceptional heart care, we have created designated Provider Care Teams.  These Care Teams include your primary Cardiologist (physician) and Advanced Practice Providers (APPs -  Physician Assistants and Nurse Practitioners) who all work together to provide you with the care you need, when you need it.  We recommend signing up for the patient portal called "MyChart".  Sign up information is provided on this After Visit Summary.  MyChart is used to connect with patients for Virtual Visits (Telemedicine).  Patients are able to view lab/test results, encounter notes, upcoming appointments, etc.  Non-urgent messages can be sent to your provider as well.   To learn more about what you can do with MyChart, go to [email or web address].    Your next appointment:   6 month(s)  The format for your next appointment:   In Person  Provider:   Jonathan Branch, MD   Other Instructions Thank you for choosing [facility]!    

## 2021-06-25 ENCOUNTER — Ambulatory Visit (INDEPENDENT_AMBULATORY_CARE_PROVIDER_SITE_OTHER): Payer: Medicare HMO | Admitting: Urology

## 2021-06-25 ENCOUNTER — Encounter: Payer: Self-pay | Admitting: Urology

## 2021-06-25 ENCOUNTER — Other Ambulatory Visit: Payer: Self-pay

## 2021-06-25 VITALS — BP 132/82 | HR 83 | Ht 72.0 in | Wt 208.0 lb

## 2021-06-25 DIAGNOSIS — N529 Male erectile dysfunction, unspecified: Secondary | ICD-10-CM

## 2021-06-25 LAB — URINALYSIS, ROUTINE W REFLEX MICROSCOPIC
Bilirubin, UA: NEGATIVE
Glucose, UA: NEGATIVE
Leukocytes,UA: NEGATIVE
Nitrite, UA: NEGATIVE
Specific Gravity, UA: 1.02 (ref 1.005–1.030)
Urobilinogen, Ur: 0.2 mg/dL (ref 0.2–1.0)
pH, UA: 6.5 (ref 5.0–7.5)

## 2021-06-25 MED ORDER — SILDENAFIL CITRATE 100 MG PO TABS: 100.0000 mg | ORAL_TABLET | ORAL | 5 refills | Status: DC | PRN

## 2021-06-25 NOTE — Progress Notes (Signed)
06/25/2021 12:15 PM   Miguel Spencer [birth year] [id number]  Referring provider: [name], MD [address] [place],  [place] [address]  No chief complaint on file.   HPI:  New patient-   1) erectile dysfunction-Torien is a 70 year old male with a history of erectile dysfunction.  He has used sildenafil up to 100 mg which worked better than tadalafil. He is in a new relationship. His libido is very high. Sometimes he can perform without pde5i. He has one child.   Has a history of hypertension.  He is on beta-blocker, calcium channel blocker and Eliquis. He has a fib.   UA is clear. AUASS = 1 . He retired after working in [employer] in [employer].    PMH: Past Medical History:  Diagnosis Date   A-fib ([place])    A-fib ([facility])    Alcohol abuse    Alcoholic ([place])    Anxiety    Colon polyp    Depression    GI bleed    Gout    Hypertension    Vertigo     Surgical History: Past Surgical History:  Procedure Laterality Date   BIOPSY  02/26/2019   Procedure: BIOPSY;  Surgeon: [name], MD;  [facility];  Service: Endoscopy;;  Gastric    BIOPSY  03/05/2019   Procedure: BIOPSY;  Surgeon: Daneil Dolin, MD;  [facility];  Service: Endoscopy;;  ascending colon bx   COLONOSCOPY  07/2018   [place] piecemeal fashion status post tattooing, 30 x 18 mm sized sigmoid colon polyp removed via snare status post tattooing.  Recommended repeat colonoscopy in 3 months.  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   COLONOSCOPY WITH PROPOFOL N/A 03/05/2019   Procedure: COLONOSCOPY WITH PROPOFOL;  Surgeon: Daneil Dolin, MD;  [facility];  Service: Endoscopy;  Laterality: N/A;   ESOPHAGOGASTRODUODENOSCOPY (EGD) WITH PROPOFOL N/A 02/26/2019   Dr. Oneida Alar: LA grade D esophagitis, gastritis with benign biopsies, no H. pylori, 5  nonbleeding superficial duodenal ulcers.  Felt to be NSAID related.   HERNIA REPAIR     inguinal-not sure which side   POLYPECTOMY  03/05/2019   Procedure: POLYPECTOMY;  Surgeon: Daneil Dolin, MD;  [facility];  Service: Endoscopy;;  cold snare polypectomy   UMBILICAL HERNIA REPAIR N/A 07/06/2020   Procedure: UMBILICAL HERNIA REPAIR WITH MESH;  Surgeon: Virl Cagey, MD;  Location: [facility];  Service: General;  Laterality: N/A;    Home Medications:  Allergies as of 06/25/2021   No Known Allergies      Medication List        Accurate as of June 25, 2021 12:15 PM. If you have any questions, ask your nurse or doctor.          apixaban 5 MG Tabs tablet Commonly known as: Eliquis Take 1 tablet (5 mg total) by mouth 2 (two) times daily.   colchicine 0.6 MG tablet Take 0.6 mg by mouth daily as needed (gout flare).   diltiazem 180 MG 24 hr capsule Commonly known as: DILACOR XR Take 180 mg by mouth daily.   ibuprofen 200 MG tablet Commonly known as: ADVIL Take 800 mg by mouth every 6 (six) hours as needed for headache.   indomethacin 50 MG capsule Commonly known as: INDOCIN Take 50 mg by mouth as needed (Gout).  LORazepam 1 MG tablet Commonly known as: ATIVAN Take 1 mg by mouth at bedtime as needed for sleep.   metoprolol succinate 100 MG 24 hr tablet Commonly known as: TOPROL-XL Take 1 tablet (100 mg total) by mouth daily. Take with or immediately following a meal.   pantoprazole 40 MG tablet Commonly known as: PROTONIX Take 40 mg by mouth daily.   valACYclovir 500 MG tablet Commonly known as: VALTREX Take 500 mg by mouth 4 (four) times daily as needed (cold sores).        Allergies: No Known Allergies  Family History: Family History  Problem Relation Age of Onset   Cancer Mother    Heart disease Father    Heart disease Brother    Heart disease Other    Colon cancer Neg Hx    Gastric cancer Neg Hx    Esophageal cancer Neg Hx      Social History:  reports that he has never smoked. He has never used smokeless tobacco. He reports current alcohol use of about 70.0 standard drinks per week. He reports that he does not use drugs.   Physical Exam: BP 132/82   Pulse 83   Ht 6' (1.829 m)   Wt 208 lb (94.3 kg)   BMI 28.21 kg/m   Constitutional:  Alert and oriented, No acute distress. HEENT: [place] AT, moist mucus membranes.  Trachea midline, no masses. Cardiovascular: No clubbing, cyanosis, or edema. Respiratory: Normal respiratory effort, no increased work of breathing. GI: Abdomen is soft, nontender, nondistended, no abdominal masses GU: No CVA tenderness Skin: No rashes, bruises or suspicious lesions. Neurologic: Grossly intact, no focal deficits, moving all 4 extremities. Psychiatric: Normal mood and affect. GU: Penis circumcised, normal foreskin, testicles descended bilaterally and palpably normal, bilateral epididymis palpably normal, scrotum normal DRE: Prostate 25 g, smooth without hard area or nodule   Laboratory Data: Lab Results  Component Value Date   WBC 6.9 07/04/2020   HGB 13.7 07/04/2020   HCT 40.9 07/04/2020   MCV 93.0 07/04/2020   PLT 220 07/04/2020    Lab Results  Component Value Date   CREATININE 0.91 07/04/2020    No results found for: PSA  No results found for: TESTOSTERONE  No results found for: HGBA1C  Urinalysis    Component Value Date/Time   COLORURINE YELLOW 02/20/2013 2236   APPEARANCEUR CLEAR 02/20/2013 2236   LABSPEC 1.025 02/20/2013 2236   PHURINE 6.0 02/20/2013 2236   GLUCOSEU NEGATIVE 02/20/2013 2236   HGBUR MODERATE (A) 02/20/2013 2236   BILIRUBINUR NEGATIVE 02/20/2013 2236   KETONESUR TRACE (A) 02/20/2013 2236   PROTEINUR NEGATIVE 02/20/2013 2236   UROBILINOGEN 0.2 02/20/2013 2236   NITRITE NEGATIVE 02/20/2013 2236   LEUKOCYTESUR NEGATIVE 02/20/2013 2236    Lab Results  Component Value Date   BACTERIA RARE 02/20/2013      Assessment & Plan:    1.  Erectile dysfunction, unspecified erectile dysfunction type Discussed nature r/b/a to pde5i and injections. He doesn't need it every time. Rx for sildenafil printed as he wanted to shop around. We discussed [facility] and sex therapist.   No follow-ups on file.  Festus Aloe, MD  [facility]  [address] [place], [place] [address] [phone]

## 2021-06-25 NOTE — Progress Notes (Signed)

## 2021-07-02 DIAGNOSIS — F5221 Male erectile disorder: Secondary | ICD-10-CM | POA: Diagnosis not present

## 2021-07-02 DIAGNOSIS — Z23 Encounter for immunization: Secondary | ICD-10-CM | POA: Diagnosis not present

## 2021-07-02 DIAGNOSIS — I10 Essential (primary) hypertension: Secondary | ICD-10-CM | POA: Diagnosis not present

## 2021-07-02 DIAGNOSIS — I480 Paroxysmal atrial fibrillation: Secondary | ICD-10-CM | POA: Diagnosis not present

## 2021-08-07 DIAGNOSIS — E782 Mixed hyperlipidemia: Secondary | ICD-10-CM | POA: Diagnosis not present

## 2021-08-07 DIAGNOSIS — Z0000 Encounter for general adult medical examination without abnormal findings: Secondary | ICD-10-CM | POA: Diagnosis not present

## 2021-08-09 DIAGNOSIS — G4700 Insomnia, unspecified: Secondary | ICD-10-CM | POA: Diagnosis not present

## 2021-08-09 DIAGNOSIS — F10988 Alcohol use, unspecified with other alcohol-induced disorder: Secondary | ICD-10-CM | POA: Diagnosis not present

## 2021-08-09 DIAGNOSIS — M791 Myalgia, unspecified site: Secondary | ICD-10-CM | POA: Diagnosis not present

## 2021-08-09 DIAGNOSIS — D509 Iron deficiency anemia, unspecified: Secondary | ICD-10-CM | POA: Diagnosis not present

## 2021-08-09 DIAGNOSIS — I480 Paroxysmal atrial fibrillation: Secondary | ICD-10-CM | POA: Diagnosis not present

## 2021-08-09 DIAGNOSIS — K2920 Alcoholic gastritis without bleeding: Secondary | ICD-10-CM | POA: Diagnosis not present

## 2021-08-09 DIAGNOSIS — K589 Irritable bowel syndrome without diarrhea: Secondary | ICD-10-CM | POA: Diagnosis not present

## 2021-08-09 DIAGNOSIS — E871 Hypo-osmolality and hyponatremia: Secondary | ICD-10-CM | POA: Diagnosis not present

## 2021-08-09 DIAGNOSIS — E782 Mixed hyperlipidemia: Secondary | ICD-10-CM | POA: Diagnosis not present

## 2021-09-24 ENCOUNTER — Ambulatory Visit (INDEPENDENT_AMBULATORY_CARE_PROVIDER_SITE_OTHER): Payer: Medicare HMO | Admitting: Urology

## 2021-09-24 ENCOUNTER — Encounter: Payer: Self-pay | Admitting: Urology

## 2021-09-24 ENCOUNTER — Other Ambulatory Visit: Payer: Self-pay

## 2021-09-24 VITALS — BP 132/87 | HR 70 | Wt 208.0 lb

## 2021-09-24 DIAGNOSIS — N529 Male erectile dysfunction, unspecified: Secondary | ICD-10-CM

## 2021-09-24 LAB — URINALYSIS, ROUTINE W REFLEX MICROSCOPIC
Bilirubin, UA: NEGATIVE
Glucose, UA: NEGATIVE
Ketones, UA: NEGATIVE
Leukocytes,UA: NEGATIVE
Nitrite, UA: NEGATIVE
Protein,UA: NEGATIVE
RBC, UA: NEGATIVE
Specific Gravity, UA: 1.015 (ref 1.005–1.030)
Urobilinogen, Ur: 0.2 mg/dL (ref 0.2–1.0)
pH, UA: 6 (ref 5.0–7.5)

## 2021-09-24 NOTE — Progress Notes (Signed)
09/24/2021 3:26 PM   Miguel Spencer 11/21/1950 [id number]  Referring provider: [name], MD [address] [place],  [place] [address]  No chief complaint on file.   HPI:  F/u -   1) erectile dysfunction-Guerry is a 70 year old male with a history of erectile dysfunction.  He has used sildenafil up to 100 mg which worked better than tadalafil. He is in a new relationship. His libido is very high. Sometimes he can perform without pde5i. He has one child.    Has a history of hypertension.  He is on beta-blocker, calcium channel blocker and Eliquis. He has a fib. He switched to nebivolol.    UA is clear. AUASS = 1 . He retired after working in [employer] in [place]. He is using sildenafil with great result. Taking only 20 mg weekly. Switch in beta blocker helped a lot.    PMH: Past Medical History:  Diagnosis Date   A-fib ([place])    A-fib (HCC)    Alcohol abuse    Alcoholic ([place])    Anxiety    Colon polyp    Depression    GI bleed    Gout    Hypertension    Vertigo     Surgical History: Past Surgical History:  Procedure Laterality Date   BIOPSY  02/26/2019   Procedure: BIOPSY;  Surgeon: [name], MD;  [facility];  Service: Endoscopy;;  Gastric    BIOPSY  03/05/2019   Procedure: BIOPSY;  Surgeon: Daneil Dolin, MD;  [facility];  Service: Endoscopy;;  ascending colon bx   COLONOSCOPY  07/2018   [place] piecemeal fashion status post tattooing, 30 x 18 mm sized sigmoid colon polyp removed via snare status post tattooing.  Recommended repeat colonoscopy in 3 months.  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   COLONOSCOPY WITH PROPOFOL N/A 03/05/2019   Procedure: COLONOSCOPY WITH PROPOFOL;  Surgeon: Daneil Dolin, MD;  [facility];  Service: Endoscopy;  Laterality: N/A;   ESOPHAGOGASTRODUODENOSCOPY  (EGD) WITH PROPOFOL N/A 02/26/2019   Dr. Oneida Alar: LA grade D esophagitis, gastritis with benign biopsies, no H. pylori, 5 nonbleeding superficial duodenal ulcers.  Felt to be NSAID related.   HERNIA REPAIR     inguinal-not sure which side   POLYPECTOMY  03/05/2019   Procedure: POLYPECTOMY;  Surgeon: Daneil Dolin, MD;  [facility];  Service: Endoscopy;;  cold snare polypectomy   UMBILICAL HERNIA REPAIR N/A 07/06/2020   Procedure: UMBILICAL HERNIA REPAIR WITH MESH;  Surgeon: Virl Cagey, MD;  [facility]: [facility];  Service: General;  Laterality: N/A;    Home Medications:  Allergies as of 09/24/2021   No Known Allergies      Medication List        Accurate as of September 24, 2021  3:26 PM. If you have any questions, ask your nurse or doctor.          apixaban 5 MG Tabs tablet Commonly known as: Eliquis Take 1 tablet (5 mg total) by mouth 2 (two) times daily.   colchicine 0.6 MG tablet Take 0.6 mg by mouth daily as needed (gout flare).   diltiazem 180 MG 24 hr capsule Commonly known as: DILACOR XR Take 180 mg by mouth daily.   ibuprofen 200 MG tablet Commonly known as: ADVIL Take 800 mg by mouth every  6 (six) hours as needed for headache.   indomethacin 50 MG capsule Commonly known as: INDOCIN Take 50 mg by mouth as needed (Gout).   LORazepam 1 MG tablet Commonly known as: ATIVAN Take 1 mg by mouth at bedtime as needed for sleep.   metoprolol succinate 100 MG 24 hr tablet Commonly known as: TOPROL-XL Take 1 tablet (100 mg total) by mouth daily. Take with or immediately following a meal.   Nebivolol HCl 20 MG Tabs Take 1 tablet by mouth daily.   pantoprazole 40 MG tablet Commonly known as: PROTONIX Take 40 mg by mouth daily.   sildenafil 100 MG tablet Commonly known as: Viagra Take 1 tablet (100 mg total) by mouth as needed for erectile dysfunction.   valACYclovir 500 MG tablet Commonly known as: VALTREX Take 500 mg by mouth 4 (four) times  daily as needed (cold sores).        Allergies: No Known Allergies  Family History: Family History  Problem Relation Age of Onset   Cancer Mother    Heart disease Father    Heart disease Brother    Heart disease Other    Colon cancer Neg Hx    Gastric cancer Neg Hx    Esophageal cancer Neg Hx     Social History:  reports that he has never smoked. He has never used smokeless tobacco. He reports current alcohol use of about 70.0 standard drinks per week. He reports that he does not use drugs.   Physical Exam: BP 132/87    Pulse 70    Wt 208 lb (94.3 kg)    BMI 28.21 kg/m   Constitutional:  Alert and oriented, No acute distress. HEENT: [place] AT, moist mucus membranes.  Trachea midline, no masses. Cardiovascular: No clubbing, cyanosis, or edema. Respiratory: Normal respiratory effort, no increased work of breathing. GI: Abdomen is soft, nontender, nondistended, no abdominal masses Skin: No rashes, bruises or suspicious lesions. Neurologic: Grossly intact, no focal deficits, moving all 4 extremities. Psychiatric: Normal mood and affect.  Laboratory Data: Lab Results  Component Value Date   WBC 6.9 07/04/2020   HGB 13.7 07/04/2020   HCT 40.9 07/04/2020   MCV 93.0 07/04/2020   PLT 220 07/04/2020    Lab Results  Component Value Date   CREATININE 0.91 07/04/2020    No results found for: PSA  No results found for: TESTOSTERONE  No results found for: HGBA1C  Urinalysis    Component Value Date/Time   COLORURINE YELLOW 02/20/2013 2236   APPEARANCEUR Clear 06/25/2021 1529   LABSPEC 1.025 02/20/2013 2236   PHURINE 6.0 02/20/2013 2236   GLUCOSEU Negative 06/25/2021 1529   HGBUR MODERATE (A) 02/20/2013 2236   BILIRUBINUR Negative 06/25/2021 1529   KETONESUR TRACE (A) 02/20/2013 2236   PROTEINUR Trace (A) 06/25/2021 1529   PROTEINUR NEGATIVE 02/20/2013 2236   UROBILINOGEN 0.2 02/20/2013 2236   NITRITE Negative 06/25/2021 1529   NITRITE NEGATIVE 02/20/2013 2236    LEUKOCYTESUR Negative 06/25/2021 1529    Lab Results  Component Value Date   LABMICR Comment 06/25/2021   BACTERIA RARE 02/20/2013    Pertinent Imaging: N/a   Assessment & Plan:    1. Erectile dysfunction, unspecified erectile dysfunction type Continue pde5i. PSA was sent.  - Urinalysis, Routine w reflex microscopic   No follow-ups on file.  Festus Aloe, MD  [facility]  [address] [place], [place] [address] [phone]

## 2021-09-24 NOTE — Progress Notes (Signed)

## 2021-09-25 LAB — PSA: Prostate Specific Ag, Serum: 2.8 ng/mL (ref 0.0–4.0)

## 2021-11-08 ENCOUNTER — Ambulatory Visit: Payer: Medicare HMO | Admitting: Cardiology

## 2021-11-08 NOTE — Progress Notes (Deleted)
Clinical Summary Mr. Saccente is a 71 y.o.maleseen today for follow up of the following medical problems.    1. PAF - admit with afib 08/2018 after not being able to take meds at home after being arrested - started on dilt gtt for afib with RVR. Restarted on his home toprol and dilt   - previously followed in [place], diagnosed with afib around 2017 - Dr Shirl Harris [place], [place]. - issues with insurance with eliquis and xarelto. Had been on coumadin but stopped at time of his GI bleed  09/2018 Holter: PACs, rare runs of atach up to 3 beats. No symptoms reported       - can have palpitations at times with EtOH use, but overall infrequent. Overall symptoms are tolerable.  - no recent bleeding on eliquis.        2. EtOH abuse/Cirrhosis - followed by GI - has not had a drink in 1 month      3. History of GI bleeding - admission to [facility] in [place], [place] with hematemsis and rectal bleeding 07/2018 while on coumadin - EGD and colonopscopy showed gastritis, polyp  - admitted 02/2019 with rectal bleeding while on eliquis and indomethacin for gout - 02/2019 colonoscopy concerning for ischemic colitis however CTA was benign. EGD diffuse mild inflammation, duodenal ulcers without bleeding. was to resume eliquis 03/16/19     4. Mitral regurgitation - mild to mod by 2019 echo -denies any recent symptoms   5. HTN - side effects on chlorthaldone, tried for 3 months. Causes some dizziness, felt "out of sorts" - he has cut back his salt.    6. Erectile dysfunction - not responding to cialis or viagra.  - he is seen by a urologist.  - uncler if could be a medication side effect, perhaps beta blocker         Past Medical History:  Diagnosis Date   A-fib ([place])    A-fib ([place])    Alcohol abuse    Alcoholic ([place])    Anxiety    Colon polyp    Depression    GI bleed    Gout    Hypertension    Vertigo      No Known Allergies   Current  Outpatient Medications  Medication Sig Dispense Refill   apixaban (ELIQUIS) 5 MG TABS tablet Take 1 tablet (5 mg total) by mouth 2 (two) times daily. 14 tablet 0   colchicine 0.6 MG tablet Take 0.6 mg by mouth daily as needed (gout flare).     diltiazem (DILACOR XR) 180 MG 24 hr capsule Take 180 mg by mouth daily.     ibuprofen (ADVIL) 200 MG tablet Take 800 mg by mouth every 6 (six) hours as needed for headache.     indomethacin (INDOCIN) 50 MG capsule Take 50 mg by mouth as needed (Gout).     LORazepam (ATIVAN) 1 MG tablet Take 1 mg by mouth at bedtime as needed for sleep.      metoprolol succinate (TOPROL-XL) 100 MG 24 hr tablet Take 1 tablet (100 mg total) by mouth daily. Take with or immediately following a meal. 30 tablet 1   Nebivolol HCl 20 MG TABS Take 1 tablet by mouth daily.     pantoprazole (PROTONIX) 40 MG tablet Take 40 mg by mouth daily.     sildenafil (VIAGRA) 100 MG tablet Take 1 tablet (100 mg total) by mouth as needed for erectile dysfunction. 20 tablet 5  valACYclovir (VALTREX) 500 MG tablet Take 500 mg by mouth 4 (four) times daily as needed (cold sores).     No current facility-administered medications for this visit.     Past Surgical History:  Procedure Laterality Date   BIOPSY  02/26/2019   Procedure: BIOPSY;  Surgeon: [name], MD;  [facility];  Service: Endoscopy;;  Gastric    BIOPSY  03/05/2019   Procedure: BIOPSY;  Surgeon: Daneil Dolin, MD;  [facility];  Service: Endoscopy;;  ascending colon bx   COLONOSCOPY  07/2018   [place] piecemeal fashion status post tattooing, 30 x 18 mm sized sigmoid colon polyp removed via snare status post tattooing.  Recommended repeat colonoscopy in 3 months.  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   COLONOSCOPY WITH PROPOFOL N/A 03/05/2019   Procedure: COLONOSCOPY WITH PROPOFOL;   Surgeon: Daneil Dolin, MD;  [facility];  Service: Endoscopy;  Laterality: N/A;   ESOPHAGOGASTRODUODENOSCOPY (EGD) WITH PROPOFOL N/A 02/26/2019   Dr. Oneida Alar: LA grade D esophagitis, gastritis with benign biopsies, no H. pylori, 5 nonbleeding superficial duodenal ulcers.  Felt to be NSAID related.   HERNIA REPAIR     inguinal-not sure which side   POLYPECTOMY  03/05/2019   Procedure: POLYPECTOMY;  Surgeon: Daneil Dolin, MD;  [facility];  Service: Endoscopy;;  cold snare polypectomy   UMBILICAL HERNIA REPAIR N/A 07/06/2020   Procedure: UMBILICAL HERNIA REPAIR WITH MESH;  Surgeon: Virl Cagey, MD;  Location: [facility];  Service: General;  Laterality: N/A;     No Known Allergies    Family History  Problem Relation Age of Onset   Cancer Mother    Heart disease Father    Heart disease Brother    Heart disease Other    Colon cancer Neg Hx    Gastric cancer Neg Hx    Esophageal cancer Neg Hx      Social History Mr. Bahl reports that he has never smoked. He has never used smokeless tobacco. Mr. Tse reports current alcohol use of about 70.0 standard drinks per week.   Review of Systems CONSTITUTIONAL: No weight loss, fever, chills, weakness or fatigue.  HEENT: Eyes: No visual loss, blurred vision, double vision or yellow sclerae.No hearing loss, sneezing, congestion, runny nose or sore throat.  SKIN: No rash or itching.  CARDIOVASCULAR:  RESPIRATORY: No shortness of breath, cough or sputum.  GASTROINTESTINAL: No anorexia, nausea, vomiting or diarrhea. No abdominal pain or blood.  GENITOURINARY: No burning on urination, no polyuria NEUROLOGICAL: No headache, dizziness, syncope, paralysis, ataxia, numbness or tingling in the extremities. No change in bowel or bladder control.  MUSCULOSKELETAL: No muscle, back pain, joint pain or stiffness.  LYMPHATICS: No enlarged nodes. No history of splenectomy.  PSYCHIATRIC: No history of depression or  anxiety.  ENDOCRINOLOGIC: No reports of sweating, cold or heat intolerance. No polyuria or polydipsia.  Marland Kitchen   Physical Examination There were no vitals filed for this visit. There were no vitals filed for this visit.  Gen: resting comfortably, no acute distress HEENT: no scleral icterus, pupils equal round and reactive, no palptable cervical adenopathy,  CV Resp: Clear to auscultation bilaterally GI: abdomen is soft, non-tender, non-distended, normal bowel sounds, no hepatosplenomegaly MSK: extremities are warm, no edema.  Skin: warm, no rash Neuro:  no focal deficits Psych: appropriate affect   Diagnostic Studies  08/2018 echo Study Conclusions   - Left ventricle: The cavity size was normal. Wall thickness was   increased in a pattern of mild LVH. Systolic function was normal.   The estimated ejection fraction was 50%. Diffuse hypokinesis. The   study was not technically sufficient to allow evaluation of LV   diastolic dysfunction due to atrial fibrillation. - Aortic valve: Mildly calcified annulus. Trileaflet; mildly   calcified leaflets. - Aortic root: The aortic root was mildly dilated. - Mitral valve: Mildly calcified annulus. There was mild to   moderate regurgitation. - Left atrium: The atrium was moderately dilated. - Right atrium: Central venous pressure (est): 15 mm Hg. - Atrial septum: No defect or patent foramen ovale was identified. - Tricuspid valve: There was mild regurgitation. - Pulmonary arteries: PA peak pressure: 46 mm Hg (S). - Pericardium, extracardiac: A trivial pericardial effusion was   identified posterior to the heart.     09/2018 Holter: PACs, rare runs of atach up to 3 beats. No symptoms     Assessment and Plan  1. PAF - overall symptoms controlled - continue current medications including eliquis - unclear if toprol causing erectile dysfunction, may consider uptitrating ditl and downtrating toprol at follow up if ongoing issue, he has a  urology appt coming up.    2. Mitral regurgitation -mild to moderate by 2019 echo - repeat echo after next f/u   3. HTN -side effects on cholrthalidone, he stopped taking - bp's reasonable today - if trend back up would consider ARB.         Arnoldo Lenis, M.D., F.A.C.C.

## 2021-12-17 DIAGNOSIS — E782 Mixed hyperlipidemia: Secondary | ICD-10-CM | POA: Diagnosis not present

## 2021-12-19 DIAGNOSIS — G4700 Insomnia, unspecified: Secondary | ICD-10-CM | POA: Diagnosis not present

## 2021-12-19 DIAGNOSIS — E871 Hypo-osmolality and hyponatremia: Secondary | ICD-10-CM | POA: Diagnosis not present

## 2021-12-19 DIAGNOSIS — K2920 Alcoholic gastritis without bleeding: Secondary | ICD-10-CM | POA: Diagnosis not present

## 2021-12-19 DIAGNOSIS — F10988 Alcohol use, unspecified with other alcohol-induced disorder: Secondary | ICD-10-CM | POA: Diagnosis not present

## 2021-12-19 DIAGNOSIS — I480 Paroxysmal atrial fibrillation: Secondary | ICD-10-CM | POA: Diagnosis not present

## 2021-12-19 DIAGNOSIS — D509 Iron deficiency anemia, unspecified: Secondary | ICD-10-CM | POA: Diagnosis not present

## 2021-12-19 DIAGNOSIS — K589 Irritable bowel syndrome without diarrhea: Secondary | ICD-10-CM | POA: Diagnosis not present

## 2021-12-19 DIAGNOSIS — M791 Myalgia, unspecified site: Secondary | ICD-10-CM | POA: Diagnosis not present

## 2021-12-19 DIAGNOSIS — E782 Mixed hyperlipidemia: Secondary | ICD-10-CM | POA: Diagnosis not present

## 2022-02-08 DIAGNOSIS — H6123 Impacted cerumen, bilateral: Secondary | ICD-10-CM | POA: Diagnosis not present

## 2022-03-05 DIAGNOSIS — H6122 Impacted cerumen, left ear: Secondary | ICD-10-CM | POA: Diagnosis not present

## 2022-03-25 DIAGNOSIS — L821 Other seborrheic keratosis: Secondary | ICD-10-CM | POA: Diagnosis not present

## 2022-03-25 DIAGNOSIS — D485 Neoplasm of uncertain behavior of skin: Secondary | ICD-10-CM | POA: Diagnosis not present

## 2022-03-25 DIAGNOSIS — D1801 Hemangioma of skin and subcutaneous tissue: Secondary | ICD-10-CM | POA: Diagnosis not present

## 2022-04-04 ENCOUNTER — Encounter: Payer: Self-pay | Admitting: Cardiology

## 2022-04-04 ENCOUNTER — Ambulatory Visit (INDEPENDENT_AMBULATORY_CARE_PROVIDER_SITE_OTHER): Payer: Medicare HMO | Admitting: Cardiology

## 2022-04-04 VITALS — BP 102/68 | HR 64 | Ht 71.0 in | Wt 223.0 lb

## 2022-04-04 DIAGNOSIS — D6869 Other thrombophilia: Secondary | ICD-10-CM | POA: Diagnosis not present

## 2022-04-04 DIAGNOSIS — I340 Nonrheumatic mitral (valve) insufficiency: Secondary | ICD-10-CM | POA: Diagnosis not present

## 2022-04-04 DIAGNOSIS — I10 Essential (primary) hypertension: Secondary | ICD-10-CM

## 2022-04-04 DIAGNOSIS — I480 Paroxysmal atrial fibrillation: Secondary | ICD-10-CM

## 2022-04-04 NOTE — Progress Notes (Signed)
Clinical Summary Mr. Thal is a 71 y.o.male seen today for follow up of the following medical problems.    1. PAF - admit with afib 08/2018 after not being able to take meds at home after being arrested - started on dilt gtt for afib with RVR. Restarted on his home toprol and dilt   - previously followed in [place], diagnosed with afib around 2017 - Dr Shirl Harris [place], [place]. - issues with insurance with eliquis and xarelto. Had been on coumadin but stopped at time of his GI bleed  09/2018 Holter: PACs, rare runs of atach up to 3 beats. No symptoms reported    -reports changed from lopressor to bystolic due to erectile dysfunction and doing better - no bleeding on eliquis       2. EtOH abuse/Cirrhosis - followed by GI - has not had a drink in 1 month      3. History of GI bleeding - admission to [facility] in [place], [place] with hematemsis and rectal bleeding 07/2018 while on coumadin - EGD and colonopscopy showed gastritis, polyp  - admitted 02/2019 with rectal bleeding while on eliquis and indomethacin for gout - 02/2019 colonoscopy concerning for ischemic colitis however CTA was benign. EGD diffuse mild inflammation, duodenal ulcers without bleeding. was to resume eliquis 03/16/19   - no recent issues   4. Mitral regurgitation - mild to mod by 2019 echo -no symptoms   5. HTN - side effects on chlorthaldone, tried for 3 months. Causes some dizziness, felt "out of sorts" - he has cut back his salt.    6. Erectile dysfunction - not responding to cialis or viagra.  - he is seen by a urologist.  - uncler if could be a medication side effect, perhaps beta blocker   SH: just got married few months ago   Past Medical History:  Diagnosis Date   A-fib ([place])    A-fib ([place])    Alcohol abuse    Alcoholic ([place])    Anxiety    Colon polyp    Depression    GI bleed    Gout    Hypertension    Vertigo      No Known  Allergies   Current Outpatient Medications  Medication Sig Dispense Refill   apixaban (ELIQUIS) 5 MG TABS tablet Take 1 tablet (5 mg total) by mouth 2 (two) times daily. 14 tablet 0   colchicine 0.6 MG tablet Take 0.6 mg by mouth daily as needed (gout flare).     diltiazem (DILACOR XR) 180 MG 24 hr capsule Take 180 mg by mouth daily.     ibuprofen (ADVIL) 200 MG tablet Take 800 mg by mouth every 6 (six) hours as needed for headache.     indomethacin (INDOCIN) 50 MG capsule Take 50 mg by mouth as needed (Gout).     LORazepam (ATIVAN) 1 MG tablet Take 1 mg by mouth at bedtime as needed for sleep.      metoprolol succinate (TOPROL-XL) 100 MG 24 hr tablet Take 1 tablet (100 mg total) by mouth daily. Take with or immediately following a meal. 30 tablet 1   Nebivolol HCl 20 MG TABS Take 1 tablet by mouth daily.     pantoprazole (PROTONIX) 40 MG tablet Take 40 mg by mouth daily.     sildenafil (VIAGRA) 100 MG tablet Take 1 tablet (100 mg total) by mouth as needed for erectile dysfunction. 20 tablet 5   valACYclovir (VALTREX)  500 MG tablet Take 500 mg by mouth 4 (four) times daily as needed (cold sores).     No current facility-administered medications for this visit.     Past Surgical History:  Procedure Laterality Date   BIOPSY  02/26/2019   Procedure: BIOPSY;  Surgeon: [name], MD;  [facility];  Service: Endoscopy;;  Gastric    BIOPSY  03/05/2019   Procedure: BIOPSY;  Surgeon: Daneil Dolin, MD;  [facility];  Service: Endoscopy;;  ascending colon bx   COLONOSCOPY  07/2018   [place] piecemeal fashion status post tattooing, 30 x 18 mm sized sigmoid colon polyp removed via snare status post tattooing.  Recommended repeat colonoscopy in 3 months.  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   COLONOSCOPY WITH PROPOFOL N/A 03/05/2019   Procedure:  COLONOSCOPY WITH PROPOFOL;  Surgeon: Daneil Dolin, MD;  [facility];  Service: Endoscopy;  Laterality: N/A;   ESOPHAGOGASTRODUODENOSCOPY (EGD) WITH PROPOFOL N/A 02/26/2019   Dr. Oneida Alar: LA grade D esophagitis, gastritis with benign biopsies, no H. pylori, 5 nonbleeding superficial duodenal ulcers.  Felt to be NSAID related.   HERNIA REPAIR     inguinal-not sure which side   POLYPECTOMY  03/05/2019   Procedure: POLYPECTOMY;  Surgeon: Daneil Dolin, MD;  [facility];  Service: Endoscopy;;  cold snare polypectomy   UMBILICAL HERNIA REPAIR N/A 07/06/2020   Procedure: UMBILICAL HERNIA REPAIR WITH MESH;  Surgeon: Virl Cagey, MD;  Location: [facility];  Service: General;  Laterality: N/A;     No Known Allergies    Family History  Problem Relation Age of Onset   Cancer Mother    Heart disease Father    Heart disease Brother    Heart disease Other    Colon cancer Neg Hx    Gastric cancer Neg Hx    Esophageal cancer Neg Hx      Social History Mr. Balandran reports that he has never smoked. He has never used smokeless tobacco. Mr. Rogacki reports current alcohol use of about 70.0 standard drinks of alcohol per week.   Review of Systems CONSTITUTIONAL: No weight loss, fever, chills, weakness or fatigue.  HEENT: Eyes: No visual loss, blurred vision, double vision or yellow sclerae.No hearing loss, sneezing, congestion, runny nose or sore throat.  SKIN: No rash or itching.  CARDIOVASCULAR: per hpi RESPIRATORY: No shortness of breath, cough or sputum.  GASTROINTESTINAL: No anorexia, nausea, vomiting or diarrhea. No abdominal pain or blood.  GENITOURINARY: No burning on urination, no polyuria NEUROLOGICAL: No headache, dizziness, syncope, paralysis, ataxia, numbness or tingling in the extremities. No change in bowel or bladder control.  MUSCULOSKELETAL: No muscle, back pain, joint pain or stiffness.  LYMPHATICS: No enlarged nodes. No history of splenectomy.   PSYCHIATRIC: No history of depression or anxiety.  ENDOCRINOLOGIC: No reports of sweating, cold or heat intolerance. No polyuria or polydipsia.  Marland Kitchen   Physical Examination Today's Vitals   04/04/22 1119  BP: 102/68  Pulse: 64  SpO2: 97%  Weight: 223 lb (101.2 kg)  Height: '5\' 11"'$  (1.803 m)   Body mass index is 31.1 kg/m.  Gen: resting comfortably, no acute distress HEENT: no scleral icterus, pupils equal round and reactive, no palptable cervical adenopathy,  CV: RRR, no m/r/g no jvd Resp: Clear to auscultation bilaterally GI: abdomen is soft, non-tender, non-distended, normal bowel sounds, no  hepatosplenomegaly MSK: extremities are warm, no edema.  Skin: warm, no rash Neuro:  no focal deficits Psych: appropriate affect   Diagnostic Studies  08/2018 echo Study Conclusions   - Left ventricle: The cavity size was normal. Wall thickness was   increased in a pattern of mild LVH. Systolic function was normal.   The estimated ejection fraction was 50%. Diffuse hypokinesis. The   study was not technically sufficient to allow evaluation of LV   diastolic dysfunction due to atrial fibrillation. - Aortic valve: Mildly calcified annulus. Trileaflet; mildly   calcified leaflets. - Aortic root: The aortic root was mildly dilated. - Mitral valve: Mildly calcified annulus. There was mild to   moderate regurgitation. - Left atrium: The atrium was moderately dilated. - Right atrium: Central venous pressure (est): 15 mm Hg. - Atrial septum: No defect or patent foramen ovale was identified. - Tricuspid valve: There was mild regurgitation. - Pulmonary arteries: PA peak pressure: 46 mm Hg (S). - Pericardium, extracardiac: A trivial pericardial effusion was   identified posterior to the heart.     09/2018 Holter: PACs, rare runs of atach up to 3 beats. No symptoms       Assessment and Plan  1. PAF/acquired thrombophilia - no symptoms, continue current meds - continue eliquis for  stroke prevention   2. Mitral regurgitation -mild to moderate by 2019 echo - due for repeat US, will order   3. HTN -side effects on cholrthalidone, he stopped taking - bp's look fine on bystolic and diltiazem which he is also on for afib, continue        Arnoldo Lenis, M.D.

## 2022-04-04 NOTE — Patient Instructions (Signed)
Medication Instructions:  Your physician recommends that you continue on your current medications as directed. Please refer to the Current Medication list given to you today.   Labwork: None  Testing/Procedures: Your physician has requested that you have an echocardiogram. Echocardiography is a painless test that uses sound waves to create images of your heart. It provides your doctor with information about the size and shape of your heart and how well your heart's chambers and valves are working. This procedure takes approximately one hour. There are no restrictions for this procedure.   Follow-Up: Follow up with Dr. Harl Bowie in 1 year.   Any Other Special Instructions Will Be Listed Below (If Applicable).     If you need a refill on your cardiac medications before your next appointment, please call your pharmacy.

## 2022-04-23 ENCOUNTER — Ambulatory Visit (HOSPITAL_COMMUNITY)
Admission: RE | Admit: 2022-04-23 | Discharge: 2022-04-23 | Disposition: A | Discharge status: Home / Self Care | Payer: Medicare HMO | Source: Ambulatory Visit | Attending: Cardiology | Admitting: Cardiology

## 2022-04-23 DIAGNOSIS — I340 Nonrheumatic mitral (valve) insufficiency: Secondary | ICD-10-CM | POA: Diagnosis not present

## 2022-04-23 LAB — ECHOCARDIOGRAM COMPLETE
AR max vel: 1.76 cm2
AV Area VTI: 1.76 cm2
AV Area mean vel: 1.79 cm2
AV Mean grad: 9 mmHg
AV Peak grad: 16 mmHg
Ao pk vel: 2 m/s
Area-P 1/2: 4.49 cm2
S' Lateral: 3.1 cm

## 2022-04-23 NOTE — Progress Notes (Signed)
*  PRELIMINARY RESULTS* Echocardiogram 2D Echocardiogram has been performed.  Samuel Germany 04/23/2022, 1:59 PM

## 2022-05-14 ENCOUNTER — Telehealth: Payer: Self-pay | Admitting: Cardiology

## 2022-05-14 NOTE — Telephone Encounter (Signed)
Left message for patient to call office back regarding results.

## 2022-05-14 NOTE — Telephone Encounter (Signed)
Patient notified and verbalized understanding. Patient had no questions or concerns at this time.  

## 2022-05-14 NOTE — Telephone Encounter (Signed)
-----   Message from Arnoldo Lenis, MD sent at 05/13/2022  1:17 PM EDT ----- Echo shows normal heart pumping function. Mitral valve has mild leak that is minor and not a concnern at this time.   Overall echo looks good  J BrancH MD

## 2022-05-14 NOTE — Telephone Encounter (Signed)
Pt is returning call in regards to results. Requesting call back.  

## 2022-06-17 DIAGNOSIS — E782 Mixed hyperlipidemia: Secondary | ICD-10-CM | POA: Diagnosis not present

## 2022-06-24 DIAGNOSIS — K2920 Alcoholic gastritis without bleeding: Secondary | ICD-10-CM | POA: Diagnosis not present

## 2022-06-24 DIAGNOSIS — F10988 Alcohol use, unspecified with other alcohol-induced disorder: Secondary | ICD-10-CM | POA: Diagnosis not present

## 2022-06-24 DIAGNOSIS — E782 Mixed hyperlipidemia: Secondary | ICD-10-CM | POA: Diagnosis not present

## 2022-06-24 DIAGNOSIS — I10 Essential (primary) hypertension: Secondary | ICD-10-CM | POA: Diagnosis not present

## 2022-06-24 DIAGNOSIS — Z0000 Encounter for general adult medical examination without abnormal findings: Secondary | ICD-10-CM | POA: Diagnosis not present

## 2022-06-24 DIAGNOSIS — I480 Paroxysmal atrial fibrillation: Secondary | ICD-10-CM | POA: Diagnosis not present

## 2022-06-24 DIAGNOSIS — M791 Myalgia, unspecified site: Secondary | ICD-10-CM | POA: Diagnosis not present

## 2022-06-24 DIAGNOSIS — D509 Iron deficiency anemia, unspecified: Secondary | ICD-10-CM | POA: Diagnosis not present

## 2022-06-24 DIAGNOSIS — G4700 Insomnia, unspecified: Secondary | ICD-10-CM | POA: Diagnosis not present

## 2022-06-25 ENCOUNTER — Encounter: Payer: Self-pay | Admitting: Internal Medicine

## 2022-07-08 ENCOUNTER — Ambulatory Visit: Payer: Medicare HMO | Admitting: Internal Medicine

## 2022-07-08 ENCOUNTER — Encounter: Payer: Self-pay | Admitting: Internal Medicine

## 2022-07-08 VITALS — BP 96/54 | HR 53 | Temp 98.1°F | Resp 16 | Ht 70.0 in | Wt 229.4 lb

## 2022-07-08 DIAGNOSIS — H1013 Acute atopic conjunctivitis, bilateral: Secondary | ICD-10-CM | POA: Diagnosis not present

## 2022-07-08 DIAGNOSIS — J3089 Other allergic rhinitis: Secondary | ICD-10-CM | POA: Diagnosis not present

## 2022-07-08 DIAGNOSIS — J310 Chronic rhinitis: Secondary | ICD-10-CM

## 2022-07-08 DIAGNOSIS — K7460 Unspecified cirrhosis of liver: Secondary | ICD-10-CM | POA: Diagnosis not present

## 2022-07-08 MED ORDER — FLUTICASONE PROPIONATE 50 MCG/ACT NA SUSP: 2.0000 | Freq: Every day | NASAL | 3 refills | Status: DC

## 2022-07-08 NOTE — Progress Notes (Signed)
NEW PATIENT  Date of Service/Encounter:  07/08/22  Consult requested by: [name], MD   Subjective:   Miguel Spencer (DOB: 1951-08-07) is a 71 y.o. male who presents to the clinic on 07/08/2022 with a chief complaint of Allergic Rhinitis  (Has been having congestion for at least 6 weeks. Wakes up in the morning with a runny nose. Has been sneezing. Has also been having cough. Sometimes struggles to breathe through his nose /Sings with the choir and feels like his voice is breaking up ) .    History obtained from: chart review and patient.   Rhinitis:  Ongoing for years but worse since July 2023.  He did have COVID Dec 2022.  He also got married in April 2023 and moved in with his wife who has a new cat.  Symptoms include: wet coughing, nasal congestion, rhinorrhea, post nasal drainage, sneezing, watery eyes, and itchy eyes.  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  Also reports trouble breathing but this is because he feels so congested, he can't get air in through his nose. Occurs year-round Potential triggers: possibly cat and pollen Treatments tried: Xyzal. Last use was yesterday. Also tried Flonase PRN in the past. Previous allergy testing: no History of reflux/heartburn: yes controlled with medication - protonix. History of chronic sinusitis or sinus surgery: no   No history of asthma, food allergies, drug allergies.     Past Medical History: Past Medical History:  Diagnosis Date   A-fib ([place])    A-fib ([facility])    Alcohol abuse    Alcoholic ([place])    Anxiety    Colon polyp    Depression    GI bleed    Gout    Hypertension    Vertigo    Past Surgical History: Past Surgical History:  Procedure Laterality Date   ADENOIDECTOMY     BIOPSY  02/26/2019   Procedure: BIOPSY;  Surgeon: [name], MD;  [facility];  Service: Endoscopy;;  Gastric    BIOPSY  03/05/2019   Procedure: BIOPSY;  Surgeon: Daneil Dolin,  MD;  [facility];  Service: Endoscopy;;  ascending colon bx   COLONOSCOPY  07/2018   [place] piecemeal fashion status post tattooing, 30 x 18 mm sized sigmoid colon polyp removed via snare status post tattooing.  Recommended repeat colonoscopy in 3 months.  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   COLONOSCOPY WITH PROPOFOL N/A 03/05/2019   Procedure: COLONOSCOPY WITH PROPOFOL;  Surgeon: Daneil Dolin, MD;  [facility];  Service: Endoscopy;  Laterality: N/A;   ESOPHAGOGASTRODUODENOSCOPY (EGD) WITH PROPOFOL N/A 02/26/2019   Dr. Oneida Alar: LA grade D esophagitis, gastritis with benign biopsies, no H. pylori, 5 nonbleeding superficial duodenal ulcers.  Felt to be NSAID related.   HERNIA REPAIR     inguinal-not sure which side   POLYPECTOMY  03/05/2019   Procedure: POLYPECTOMY;  Surgeon: Daneil Dolin, MD;  [facility];  Service: Endoscopy;;  cold snare polypectomy   TONSILLECTOMY     UMBILICAL HERNIA REPAIR N/A 07/06/2020   Procedure: UMBILICAL HERNIA REPAIR WITH MESH;  Surgeon: Virl Cagey, MD;  Location: [facility];  Service: General;  Laterality: N/A;    Family History: Family History  Problem Relation Age of Onset   Cancer Mother  Heart disease Father    Heart disease Brother    Heart disease Other    Colon cancer Neg Hx    Gastric cancer Neg Hx    Esophageal cancer Neg Hx     Social History:  Lives in an unknown year house Flooring in bedroom: laminate Pets: cat and dog Tobacco use/exposure: none Job: retired; [employer] in [employer]   Medication List:  Allergies as of 07/08/2022   No Known Allergies      Medication List        Accurate as of July 08, 2022  3:02 PM. If you have any questions, ask your nurse or doctor.          apixaban 5 MG Tabs tablet Commonly known as: Eliquis Take 1 tablet (5  mg total) by mouth 2 (two) times daily.   colchicine 0.6 MG tablet Take 0.6 mg by mouth daily as needed (gout flare).   diltiazem 180 MG 24 hr capsule Commonly known as: DILACOR XR Take 180 mg by mouth daily.   fluticasone 50 MCG/ACT nasal spray Commonly known as: FLONASE Place 2 sprays into both nostrils daily. Started by: Larose Kells, MD   ibuprofen 200 MG tablet Commonly known as: ADVIL Take 800 mg by mouth every 6 (six) hours as needed for headache.   levocetirizine 5 MG tablet Commonly known as: XYZAL Take 5 mg by mouth at bedtime.   LORazepam 1 MG tablet Commonly known as: ATIVAN Take 1 mg by mouth at bedtime as needed for sleep.   nebivolol 10 MG tablet Commonly known as: BYSTOLIC Take 10 mg by mouth daily.   pantoprazole 40 MG tablet Commonly known as: PROTONIX Take 40 mg by mouth daily.   sildenafil 100 MG tablet Commonly known as: Viagra Take 1 tablet (100 mg total) by mouth as needed for erectile dysfunction.   valACYclovir 500 MG tablet Commonly known as: VALTREX Take 500 mg by mouth 4 (four) times daily as needed (cold sores).         REVIEW OF SYSTEMS: Pertinent positives and negatives discussed in HPI.   Objective:   Physical Exam: BP (!) 96/54   Pulse (!) 53   Temp 98.1 F (36.7 C)   Resp 16   Ht '5\' 10"'$  (1.778 m)   Wt 229 lb 6 oz (104 kg)   SpO2 98%   BMI 32.91 kg/m  Body mass index is 32.91 kg/m. GEN: alert, well developed HEENT: clear conjunctiva, TM grey and translucent, nose with + inferior turbinate hypertrophy, pink nasal mucosa, slight clear rhinorrhea, + cobblestoning HEART: regular rate and rhythm, no murmur LUNGS: clear to auscultation bilaterally, no coughing, unlabored respiration ABDOMEN: soft, non distended  SKIN: no rashes or lesions  Reviewed:  PCP referral- has tried Xyzal but continues to have symptoms.    Assessment:   1. Chronic rhinitis   2. Allergic conjunctivitis of both eyes      Plan/Recommendations:  - Use nasal saline rinses before nose sprays such as with Neilmed Sinus Rinse.  Use distilled water.   - Use Flonase 2 sprays each nostril daily. Aim upward and outward. - Hold all anti-histamines (Zyrtec, Xyzal, Benadryl, Claritin etc) until next visit. - Symptoms consistent concerning for allergic trigger so will plan to skin test at next visit.  But this could also be post COVID rhinitis.   Return in about 1 week (around 07/15/2022).  Harlon Flor, MD [facility] [place] [facility] [place]

## 2022-07-08 NOTE — Patient Instructions (Signed)
Rhinitis: - Use nasal saline rinses before nose sprays such as with Neilmed Sinus Rinse.  Use distilled water.   - Use Flonase 2 sprays each nostril daily. Aim upward and outward. - Hold all anti-histamines (Zyrtec, Xyzal, Benadryl, Claritin etc) until next visit.

## 2022-07-10 ENCOUNTER — Other Ambulatory Visit: Payer: Self-pay

## 2022-07-10 MED ORDER — SILDENAFIL CITRATE 100 MG PO TABS: 100.0000 mg | ORAL_TABLET | ORAL | 0 refills | Status: DC | PRN

## 2022-07-10 MED ORDER — SILDENAFIL CITRATE 100 MG PO TABS: 100.0000 mg | ORAL_TABLET | ORAL | 5 refills | Status: DC | PRN

## 2022-07-10 NOTE — Progress Notes (Signed)
Rx refill sent with refills

## 2022-07-11 ENCOUNTER — Other Ambulatory Visit: Payer: Self-pay

## 2022-07-11 MED ORDER — SILDENAFIL CITRATE 100 MG PO TABS: 100.0000 mg | ORAL_TABLET | ORAL | 5 refills | Status: AC | PRN

## 2022-07-15 ENCOUNTER — Ambulatory Visit (INDEPENDENT_AMBULATORY_CARE_PROVIDER_SITE_OTHER): Payer: Medicare HMO | Admitting: Internal Medicine

## 2022-07-15 ENCOUNTER — Encounter: Payer: Self-pay | Admitting: Internal Medicine

## 2022-07-15 VITALS — BP 122/68 | HR 69 | Temp 98.0°F | Resp 16

## 2022-07-15 DIAGNOSIS — H1013 Acute atopic conjunctivitis, bilateral: Secondary | ICD-10-CM

## 2022-07-15 DIAGNOSIS — J3089 Other allergic rhinitis: Secondary | ICD-10-CM

## 2022-07-15 DIAGNOSIS — T50905A Adverse effect of unspecified drugs, medicaments and biological substances, initial encounter: Secondary | ICD-10-CM

## 2022-07-15 MED ORDER — AZELASTINE HCL 0.1 % NA SOLN: 2.0000 | Freq: Two times a day (BID) | NASAL | 4 refills | Status: DC

## 2022-07-15 MED ORDER — FEXOFENADINE HCL 180 MG PO TABS: 180.0000 mg | ORAL_TABLET | Freq: Every day | ORAL | 3 refills | Status: DC

## 2022-07-15 MED ORDER — FLUTICASONE PROPIONATE 50 MCG/ACT NA SUSP
2.0000 | Freq: Every day | NASAL | 4 refills | Status: DC
Start: 2022-07-15 — End: 2023-02-02

## 2022-07-15 NOTE — Progress Notes (Addendum)
FOLLOW UP Date of Service/Encounter:  07/15/22   Subjective:  Miguel Spencer (DOB: [birth year]) is a 71 y.o. male who returns to the [facility] [place] on 07/15/2022 for follow up for rhinoconjunctivitis.  History obtained from: chart review and patient.  Rhinitis/Conjunctivitis:  Reports since last visit, he has held his Xyzal and all other antihistamines.  He reports his symptoms are intermittent and there are some days where he does okay and others where is worse.  Still having wet cough, congestion, rhinorrhea, PND, sneezing, itchy watery eyes and voice breaking with choir.  He did start Flonase as instructed last visit and has noticed slight improvement with it.   He previously was on Xyzal but thinks it was making him sleepy.  Past Medical History: Past Medical History:  Diagnosis Date   A-fib ([place])    A-fib ([facility])    Alcohol abuse    Alcoholic ([place])    Anxiety    Colon polyp    Depression    GI bleed    Gout    Hypertension    Vertigo     Objective:  BP 122/68   Pulse 69   Temp 98 F (36.7 C)   Resp 16   SpO2 97%  There is no height or weight on file to calculate BMI. Physical Exam: GEN: alert, well developed HEENT: clear conjunctiva, TM grey and translucent, nose with moderate inferior turbinate hypertrophy, pale nasal mucosa, clear rhinorrhea, + cobblestoning HEART: regular rate and rhythm, no murmur LUNGS: clear to auscultation bilaterally, no coughing, unlabored respiration SKIN: no rashes or lesions  Skin Testing:  Skin prick testing was placed, which includes aeroallergens/foods, histamine control, and saline control.  Written consent was obtained prior to placing test.  We discussed risks including anaphylaxis. Patient tolerated procedure well.  Allergy testing results were read and interpreted by myself, documented by clinical staff. Adequate positive and negative control.  Positive results to:  Results discussed with patient/family.  Airborne  Adult Perc - 07/15/22 1024     Time Antigen Placed 1024    Allergen Manufacturer Lavella Hammock    Location Back    Number of Test 59    1. Control-Buffer 50% Glycerol Negative    2. Control-Histamine 1 mg/ml 2+    3. Albumin saline Negative    4. [place] Negative    5. [place] Negative    6. Johnson Negative    7. [place] Blue Negative    8. Meadow Fescue Negative    9. Perennial Rye Negative    10. Sweet Vernal Negative    11. Timothy Negative    12. Cocklebur Negative    13. Burweed Marshelder Negative    14. Ragweed, short Negative    15. Ragweed, Giant Negative    16. Plantain,  English Negative    17. Lamb's Quarters Negative    18. Sheep Sorrell Negative    19. Rough Pigweed Negative    20. Marsh Elder, Rough Negative    21. Mugwort, Common Negative    22. Ash mix Negative    23. Birch mix Negative    24. Beech American Negative    25. Box, Elder Negative    26. Cedar, red Negative    27. Cottonwood, [place] Negative    28. Elm mix Negative    29. Hickory Negative    30. Maple mix Negative    31. Oak, [place] mix Negative    32. Pecan Pollen Negative    33. Mas[employer] Corporation  mix Negative    34. Sycamore Eastern Negative    35. Robbins, Black Pollen Negative    36. Alternaria alternata Negative    37. Cladosporium Herbarum Negative    38. Aspergillus mix Negative    39. Penicillium mix Negative    40. Bipolaris sorokiniana (Helminthosporium) Negative    41. Drechslera spicifera (Curvularia) Negative    42. Mucor plumbeus Negative    43. Fusarium moniliforme Negative    44. Aureobasidium pullulans (pullulara) Negative    45. Rhizopus oryzae Negative    46. Botrytis cinera Negative    47. Epicoccum nigrum Negative    48. Phoma betae Negative    49. Candida Albicans Negative    50. Trichophyton mentagrophytes Negative    51. Mite, D Farinae  5,000 AU/ml Negative    52. Mite, D Pteronyssinus  5,000 AU/ml Negative    53. Cat Hair 10,000 BAU/ml Negative    54.  Dog Epithelia  Negative    55. Mixed Feathers Negative    56. Horse Epithelia Negative    57. Cockroach, German Negative    58. Mouse Negative    59. Tobacco Leaf Negative             Intradermal - 07/15/22 1121     Time Antigen Placed 1100    Allergen Manufacturer Greer    Location Arm    Number of Test 15    Control Negative    Guatemala Negative    Johnson Negative    7 Grass Negative    Ragweed mix Negative    Weed mix Negative    Tree mix Negative    Mold 1 1+    Mold 2 1+    Mold 3 1+    Mold 4 1+    Cat 1+    Dog 1+    Cockroach 1+    Mite mix 1+              Assessment/Plan   Allergic Rhinitis Allergic Conjunctivitis - Remains uncontrolled with INCS + Xyzal.  Will add INAH and avoidance measures. - Positive skin test to: mold, cat, dog, cockroach, dust mites - Avoidance measures discussed. - Use nasal saline rinses before nose sprays such as with Neilmed Sinus Rinse.  Use distilled water.   - Use Flonase 2 sprays each nostril daily. Aim upward and outward. - Use Azelastine 1-2 sprays each nostril twice daily. Aim upward and outward. - Stop Xyzal due to sedation.  Use Allegra 180 mg daily.   Return in about 8 weeks (around 09/09/2022). Harlon Flor, MD  [facility] [place] [facility] [place]

## 2022-07-15 NOTE — Patient Instructions (Addendum)
Rhinitis: - Positive skin test to: mold, cat, dog, cockroach, dust mites - Avoidance measures discussed. - Use nasal saline rinses before nose sprays such as with Neilmed Sinus Rinse.  Use distilled water.   - Use Flonase 2 sprays each nostril daily. Aim upward and outward. - Use Azelastine 1-2 sprays each nostril twice daily. Aim upward and outward. - Use Allegra 180 mg daily.     ALLERGEN AVOIDANCE MEASURES  Dust Mites Use central air conditioning and heat; and change the filter monthly.  Pleated filters work better than mesh filters.  Electrostatic filters may also be used; wash the filter monthly.  Window air conditioners may be used, but do not clean the air as well as a central air conditioner.  Change or wash the filter monthly. Keep windows closed.  Do not use attic fans.   Encase the mattress, box springs and pillows with zippered, dust proof covers. Wash the bed linens in hot water weekly.   Remove carpet, especially from the bedroom. Remove stuffed animals, throw pillows, dust ruffles, heavy drapes and other items that collect dust from the bedroom. Do not use a humidifier.   Use wood, vinyl or leather furniture instead of cloth furniture in the bedroom. Keep the indoor humidity at 30 - 40%.  Monitor with a humidity gauge.  Molds - Indoor avoidance Use air conditioning to reduce indoor humidity.  Do not use a humidifier. Keep indoor humidity at 30 - 40%.  Use a dehumidifier if needed. In the bathroom use an exhaust fan or open a window after showering.  Wipe down damp surfaces after showering.  Clean bathrooms with a mold-killing solution (diluted bleach, or products like Tilex, etc) at least once a month. In the kitchen use an exhaust fan to remove steam from cooking.  Throw away spoiled foods immediately, and empty garbage daily.  Empty water pans below self-defrosting refrigerators frequently. Vent the clothes dryer to the outside. Limit indoor houseplants; mold grows in  the dirt.  No houseplants in the bedroom. Remove carpet from the bedroom. Encase the mattress and box springs with a zippered encasing.  Molds - Outdoor avoidance Avoid being outside when the grass is being mowed, or the ground is tilled. Avoid playing in leaves, pine straw, hay, etc.  Dead plant materials contain mold. Avoid going into barns or grain storage areas. Remove leaves, clippings and compost from around the home.  Cockroach Limit spread of food around the house; especially keep food out of bedrooms. Keep food and garbage in closed containers with a tight lid.  Never leave food out in the kitchen.  Do not leave out pet food or dirty food bowls. Mop the kitchen floor and wash countertops at least once a week. Repair leaky pipes and faucets so there is no standing water to attract roaches. Plug up cracks in the house through which cockroaches can enter. Use bait stations and approved pesticides to reduce cockroach infestation.

## 2022-09-09 ENCOUNTER — Ambulatory Visit: Payer: Medicare HMO | Admitting: Internal Medicine

## 2022-10-10 DIAGNOSIS — E782 Mixed hyperlipidemia: Secondary | ICD-10-CM | POA: Diagnosis not present

## 2022-10-10 DIAGNOSIS — R7301 Impaired fasting glucose: Secondary | ICD-10-CM | POA: Diagnosis not present

## 2022-10-16 DIAGNOSIS — I10 Essential (primary) hypertension: Secondary | ICD-10-CM | POA: Diagnosis not present

## 2022-10-16 DIAGNOSIS — K2920 Alcoholic gastritis without bleeding: Secondary | ICD-10-CM | POA: Diagnosis not present

## 2022-10-16 DIAGNOSIS — I480 Paroxysmal atrial fibrillation: Secondary | ICD-10-CM | POA: Diagnosis not present

## 2022-10-16 DIAGNOSIS — F10988 Alcohol use, unspecified with other alcohol-induced disorder: Secondary | ICD-10-CM | POA: Diagnosis not present

## 2022-10-16 DIAGNOSIS — D509 Iron deficiency anemia, unspecified: Secondary | ICD-10-CM | POA: Diagnosis not present

## 2022-10-16 DIAGNOSIS — E782 Mixed hyperlipidemia: Secondary | ICD-10-CM | POA: Diagnosis not present

## 2022-10-16 DIAGNOSIS — M791 Myalgia, unspecified site: Secondary | ICD-10-CM | POA: Diagnosis not present

## 2022-10-16 DIAGNOSIS — Z23 Encounter for immunization: Secondary | ICD-10-CM | POA: Diagnosis not present

## 2022-10-16 DIAGNOSIS — G4700 Insomnia, unspecified: Secondary | ICD-10-CM | POA: Diagnosis not present

## 2022-10-16 DIAGNOSIS — F1010 Alcohol abuse, uncomplicated: Secondary | ICD-10-CM | POA: Diagnosis not present

## 2022-10-17 ENCOUNTER — Encounter: Payer: Self-pay | Admitting: Internal Medicine

## 2023-01-31 ENCOUNTER — Telehealth: Payer: Medicare HMO | Admitting: Physician Assistant

## 2023-01-31 DIAGNOSIS — W19XXXA Unspecified fall, initial encounter: Secondary | ICD-10-CM

## 2023-01-31 DIAGNOSIS — M62838 Other muscle spasm: Secondary | ICD-10-CM | POA: Diagnosis not present

## 2023-01-31 DIAGNOSIS — M545 Low back pain, unspecified: Secondary | ICD-10-CM

## 2023-01-31 MED ORDER — BACLOFEN 20 MG PO TABS: 20.0000 mg | ORAL_TABLET | Freq: Three times a day (TID) | ORAL | 0 refills | Status: DC

## 2023-01-31 MED ORDER — METHYLPREDNISOLONE 4 MG PO TBPK: ORAL_TABLET | ORAL | 0 refills | Status: DC

## 2023-01-31 NOTE — Progress Notes (Signed)
Virtual Visit Consent   Miguel Spencer, you are scheduled for a virtual visit with a  provider today. Just as with appointments in the office, your consent must be obtained to participate. Your consent will be active for this visit and any virtual visit you may have with one of our providers in the next 365 days. If you have a MyChart account, a copy of this consent can be sent to you electronically.  As this is a virtual visit, video technology does not allow for your provider to perform a traditional examination. This may limit your provider's ability to fully assess your condition. If your provider identifies any concerns that need to be evaluated in person or the need to arrange testing (such as labs, EKG, etc.), we will make arrangements to do so. Although advances in technology are sophisticated, we cannot ensure that it will always work on either your end or our end. If the connection with a video visit is poor, the visit may have to be switched to a telephone visit. With either a video or telephone visit, we are not always able to ensure that we have a secure connection.  By engaging in this virtual visit, you consent to the provision of healthcare and authorize for your insurance to be billed (if applicable) for the services provided during this visit. Depending on your insurance coverage, you may receive a charge related to this service.  I need to obtain your verbal consent now. Are you willing to proceed with your visit today? Miguel Spencer has provided verbal consent on 01/31/2023 for a virtual visit (video or telephone). [name], PA-C  Date: 01/31/2023 12:20 PM  Virtual Visit via Video Note   I, [name], connected with  Miguel Spencer  ([id number], 1951/03/07) on 01/31/23 at 12:15 PM EDT by a video-enabled telemedicine application and verified that I am speaking with the correct person using two identifiers.  Location: Patient: Virtual Visit Location  Patient: Home Provider: Virtual Visit Location Provider: Home Office   I discussed the limitations of evaluation and management by telemedicine and the availability of in person appointments. The patient expressed understanding and agreed to proceed.    History of Present Illness: Miguel Spencer is a 72 y.o. who identifies as a male who was assigned male at birth, and is being seen today for fall.  HPI: Fall The accident occurred 5 to 7 days ago (reports he blacked out 5 days ago and fell in his bathroom). The fall occurred while standing. He fell from a height of 3 to 5 ft. He landed on Hard floor. There was no blood loss. The pain is present in the back. The pain is severe. The symptoms are aggravated by standing and sitting (laying flat). Associated symptoms include a loss of consciousness (syncopal episode onset fall). Pertinent negatives include no abdominal pain, bowel incontinence, fever, headaches, hearing loss, nausea, numbness, tingling, visual change or vomiting. He has tried acetaminophen and rest for the symptoms. The treatment provided no relief.   Feels muscular per patient. Able to move around and that helps pain. No radiculopathy, localized to low back. Discussed syncopal episode being concerning but he would not answer if this has occurred before. Declines ER or UC due to wait times. Prefers to try medication first then will seek care if not improving.  Problems:  Patient Active Problem List   Diagnosis Date Noted   Umbilical hernia without obstruction and without gangrene 01/23/2020  Ischemic colitis (HCC)    Rectal bleeding    Noninfectious gastroenteritis    Acute GI bleeding 03/04/2019   Acute blood loss anemia    GI bleeding 03/03/2019   AF (paroxysmal atrial fibrillation) (HCC) 03/03/2019   Hematemesis with nausea    Colitis 02/25/2019   Atrial fibrillation with RVR (HCC)    Melena    Heme + stool    Alcoholism (HCC)    Diarrhea of presumed infectious origin     Hepatic cirrhosis (HCC) 02/15/2019   History of colonic polyps 12/15/2018   History of peptic ulcer disease 12/15/2018   Atrial fibrillation with rapid ventricular response (HCC) 08/13/2018   Depression 08/13/2018   Anxiety 08/13/2018   Hypertension 08/13/2018   Gout 08/13/2018   Anemia 08/13/2018   Alcohol abuse 08/13/2018   Elevated lipase 08/13/2018   Prolonged QT interval 08/13/2018   Alcohol withdrawal (HCC) 08/13/2018    Allergies: No Known Allergies Medications:  Current Outpatient Medications:    baclofen (LIORESAL) 20 MG tablet, Take 1 tablet (20 mg total) by mouth 3 (three) times daily., Disp: 30 each, Rfl: 0   methylPREDNISolone (MEDROL DOSEPAK) 4 MG TBPK tablet, 6 day taper; take as directed on package instructions, Disp: 21 tablet, Rfl: 0   apixaban (ELIQUIS) 5 MG TABS tablet, Take 1 tablet (5 mg total) by mouth 2 (two) times daily., Disp: 14 tablet, Rfl: 0   azelastine (ASTELIN) 0.1 % nasal spray, Place 2 sprays into both nostrils 2 (two) times daily. Use in each nostril as directed, Disp: 30 mL, Rfl: 4   colchicine 0.6 MG tablet, Take 0.6 mg by mouth daily as needed (gout flare)., Disp: , Rfl:    diltiazem (DILACOR XR) 180 MG 24 hr capsule, Take 180 mg by mouth daily., Disp: , Rfl:    fexofenadine (ALLEGRA ALLERGY) 180 MG tablet, Take 1 tablet (180 mg total) by mouth daily., Disp: 30 tablet, Rfl: 3   fluticasone (FLONASE) 50 MCG/ACT nasal spray, Place 2 sprays into both nostrils daily., Disp: 16 g, Rfl: 4   ibuprofen (ADVIL) 200 MG tablet, Take 800 mg by mouth every 6 (six) hours as needed for headache., Disp: , Rfl:    LORazepam (ATIVAN) 1 MG tablet, Take 1 mg by mouth at bedtime as needed for sleep. , Disp: , Rfl:    nebivolol (BYSTOLIC) 10 MG tablet, Take 10 mg by mouth daily., Disp: , Rfl:    pantoprazole (PROTONIX) 40 MG tablet, Take 40 mg by mouth daily., Disp: , Rfl:    sildenafil (VIAGRA) 100 MG tablet, Take 1 tablet (100 mg total) by mouth as needed for erectile  dysfunction., Disp: 20 tablet, Rfl: 5   valACYclovir (VALTREX) 500 MG tablet, Take 500 mg by mouth 4 (four) times daily as needed (cold sores)., Disp: , Rfl:   Observations/Objective: Patient is well-developed, well-nourished in no acute distress.  Resting comfortably at home.  Head is normocephalic, atraumatic.  No labored breathing.  Speech is clear and coherent with logical content.  Patient is alert and oriented at baseline.    Assessment and Plan: 1. Fall, initial encounter - baclofen (LIORESAL) 20 MG tablet; Take 1 tablet (20 mg total) by mouth 3 (three) times daily.  Dispense: 30 each; Refill: 0 - methylPREDNISolone (MEDROL DOSEPAK) 4 MG TBPK tablet; 6 day taper; take as directed on package instructions  Dispense: 21 tablet; Refill: 0  2. Acute midline low back pain without sciatica - baclofen (LIORESAL) 20 MG tablet; Take 1 tablet (20 mg  total) by mouth 3 (three) times daily.  Dispense: 30 each; Refill: 0 - methylPREDNISolone (MEDROL DOSEPAK) 4 MG TBPK tablet; 6 day taper; take as directed on package instructions  Dispense: 21 tablet; Refill: 0  3. Muscle spasm - baclofen (LIORESAL) 20 MG tablet; Take 1 tablet (20 mg total) by mouth 3 (three) times daily.  Dispense: 30 each; Refill: 0 - methylPREDNISolone (MEDROL DOSEPAK) 4 MG TBPK tablet; 6 day taper; take as directed on package instructions  Dispense: 21 tablet; Refill: 0  - Discussed would probably be best for in person evaluation but patient politely declines  - Prefers to try medications (wants a muscle relaxer) - Will give medrol and baclofen as first-line options - Heat - Light stretches and exercises provided in AVS - Seek in person evaluation if not improving or if symptoms worsen  Follow Up Instructions: I discussed the assessment and treatment plan with the patient. The patient was provided an opportunity to ask questions and all were answered. The patient agreed with the plan and demonstrated an understanding of  the instructions.  A copy of instructions were sent to the patient via MyChart unless otherwise noted below.    The patient was advised to call back or seek an in-person evaluation if the symptoms worsen or if the condition fails to improve as anticipated.  Time:  I spent 12 minutes with the patient via telehealth technology discussing the above problems/concerns.    [name], PA-C

## 2023-01-31 NOTE — Patient Instructions (Signed)
[name], thank you for joining [name], PA-C for today's virtual visit.  While this provider is not your primary care provider (PCP), if your PCP is located in our provider database this encounter information will be shared with them immediately following your visit.   A [facility] MyChart account gives you access to today's visit and all your visits, tests, and labs performed at [facility] " click here if you don't have a [facility] MyChart account or go to [email or web address]  Consent: (Patient) Miguel Spencer provided verbal consent for this virtual visit at the beginning of the encounter.  Current Medications:  Current Outpatient Medications:    baclofen (LIORESAL) 20 MG tablet, Take 1 tablet (20 mg total) by mouth 3 (three) times daily., Disp: 30 each, Rfl: 0   methylPREDNISolone (MEDROL DOSEPAK) 4 MG TBPK tablet, 6 day taper; take as directed on package instructions, Disp: 21 tablet, Rfl: 0   apixaban (ELIQUIS) 5 MG TABS tablet, Take 1 tablet (5 mg total) by mouth 2 (two) times daily., Disp: 14 tablet, Rfl: 0   azelastine (ASTELIN) 0.1 % nasal spray, Place 2 sprays into both nostrils 2 (two) times daily. Use in each nostril as directed, Disp: 30 mL, Rfl: 4   colchicine 0.6 MG tablet, Take 0.6 mg by mouth daily as needed (gout flare)., Disp: , Rfl:    diltiazem (DILACOR XR) 180 MG 24 hr capsule, Take 180 mg by mouth daily., Disp: , Rfl:    fexofenadine (ALLEGRA ALLERGY) 180 MG tablet, Take 1 tablet (180 mg total) by mouth daily., Disp: 30 tablet, Rfl: 3   fluticasone (FLONASE) 50 MCG/ACT nasal spray, Place 2 sprays into both nostrils daily., Disp: 16 g, Rfl: 4   ibuprofen (ADVIL) 200 MG tablet, Take 800 mg by mouth every 6 (six) hours as needed for headache., Disp: , Rfl:    LORazepam (ATIVAN) 1 MG tablet, Take 1 mg by mouth at bedtime as needed for sleep. , Disp: , Rfl:    nebivolol (BYSTOLIC) 10 MG tablet, Take 10 mg by mouth daily., Disp: , Rfl:     pantoprazole (PROTONIX) 40 MG tablet, Take 40 mg by mouth daily., Disp: , Rfl:    sildenafil (VIAGRA) 100 MG tablet, Take 1 tablet (100 mg total) by mouth as needed for erectile dysfunction., Disp: 20 tablet, Rfl: 5   valACYclovir (VALTREX) 500 MG tablet, Take 500 mg by mouth 4 (four) times daily as needed (cold sores)., Disp: , Rfl:    Medications ordered in this encounter:  Meds ordered this encounter  Medications   baclofen (LIORESAL) 20 MG tablet    Sig: Take 1 tablet (20 mg total) by mouth 3 (three) times daily.    Dispense:  30 each    Refill:  0    Order Specific Question:   Supervising Provider    Answer:   Loreli Dollar   methylPREDNISolone (MEDROL DOSEPAK) 4 MG TBPK tablet    Sig: 6 day taper; take as directed on package instructions    Dispense:  21 tablet    Refill:  0    Order Specific Question:   Supervising Provider    Answer:   Merrilee Jansky [[id number]]     *If you need refills on other medications prior to your next appointment, please contact your pharmacy*  Follow-Up: Call back or seek an in-person evaluation if the symptoms worsen or if the condition fails to improve as anticipated.  [facility] [phone]  Other Instructions Back Exercises These exercises help to make your trunk and back strong. They also help to keep the lower back flexible. Doing these exercises can help to prevent or lessen pain in your lower back. If you have back pain, try to do these exercises 2-3 times each day or as told by your doctor. As you get better, do the exercises once each day. Repeat the exercises more often as told by your doctor. To stop back pain from coming back, do the exercises once each day, or as told by your doctor. Do exercises exactly as told by your doctor. Stop right away if you feel sudden pain or your pain gets worse. Exercises Single knee to chest Do these steps 3-5 times in a row for each leg: Lie on your back on a firm  bed or the floor with your legs stretched out. Bring one knee to your chest. Grab your knee or thigh with both hands and hold it in place. Pull on your knee until you feel a gentle stretch in your lower back or butt. Keep doing the stretch for 10-30 seconds. Slowly let go of your leg and straighten it. Pelvic tilt Do these steps 5-10 times in a row: Lie on your back on a firm bed or the floor with your legs stretched out. Bend your knees so they point up to the ceiling. Your feet should be flat on the floor. Tighten your lower belly (abdomen) muscles to press your lower back against the floor. This will make your tailbone point up to the ceiling instead of pointing down to your feet or the floor. Stay in this position for 5-10 seconds while you gently tighten your muscles and breathe evenly. Cat-cow Do these steps until your lower back bends more easily: Get on your hands and knees on a firm bed or the floor. Keep your hands under your shoulders, and keep your knees under your hips. You may put padding under your knees. Let your head hang down toward your chest. Tighten (contract) the muscles in your belly. Point your tailbone toward the floor so your lower back becomes rounded like the back of a cat. Stay in this position for 5 seconds. Slowly lift your head. Let the muscles of your belly relax. Point your tailbone up toward the ceiling so your back forms a sagging arch like the back of a cow. Stay in this position for 5 seconds.  Press-ups Do these steps 5-10 times in a row: Lie on your belly (face-down) on a firm bed or the floor. Place your hands near your head, about shoulder-width apart. While you keep your back relaxed and keep your hips on the floor, slowly straighten your arms to raise the top half of your body and lift your shoulders. Do not use your back muscles. You may change where you place your hands to make yourself more comfortable. Stay in this position for 5 seconds. Keep  your back relaxed. Slowly return to lying flat on the floor.  Bridges Do these steps 10 times in a row: Lie on your back on a firm bed or the floor. Bend your knees so they point up to the ceiling. Your feet should be flat on the floor. Your arms should be flat at your sides, next to your body. Tighten your butt muscles and lift your butt off the floor until your waist is almost as high as your knees. If you do not feel the muscles working in  your butt and the back of your thighs, slide your feet 1-2 inches (2.5-5 cm) farther away from your butt. Stay in this position for 3-5 seconds. Slowly lower your butt to the floor, and let your butt muscles relax. If this exercise is too easy, try doing it with your arms crossed over your chest. Belly crunches Do these steps 5-10 times in a row: Lie on your back on a firm bed or the floor with your legs stretched out. Bend your knees so they point up to the ceiling. Your feet should be flat on the floor. Cross your arms over your chest. Tip your chin a little bit toward your chest, but do not bend your neck. Tighten your belly muscles and slowly raise your chest just enough to lift your shoulder blades a tiny bit off the floor. Avoid raising your body higher than that because it can put too much stress on your lower back. Slowly lower your chest and your head to the floor. Back lifts Do these steps 5-10 times in a row: Lie on your belly (face-down) with your arms at your sides, and rest your forehead on the floor. Tighten the muscles in your legs and your butt. Slowly lift your chest off the floor while you keep your hips on the floor. Keep the back of your head in line with the curve in your back. Look at the floor while you do this. Stay in this position for 3-5 seconds. Slowly lower your chest and your face to the floor. Contact a doctor if: Your back pain gets a lot worse when you do an exercise. Your back pain does not get better within 2 hours  after you exercise. If you have any of these problems, stop doing the exercises. Do not do them again unless your doctor says it is okay. Get help right away if: You have sudden, very bad back pain. If this happens, stop doing the exercises. Do not do them again unless your doctor says it is okay. This information is not intended to replace advice given to you by your health care provider. Make sure you discuss any questions you have with your health care provider. Document Revised: 12/06/2020 Document Reviewed: 12/06/2020 Elsevier Patient Education  2023 [employer]    If you have been instructed to have an in-person evaluation today at a local Urgent Care facility, please use the link below. It will take you to a list of all of our available [facility], including address, phone number and hours of operation. Please do not delay care.  [facility]  If you or a family member do not have a primary care provider, use the link below to schedule a visit and establish care. When you choose a [facility] primary care physician or advanced practice provider, you gain a long-term partner in health. Find a Primary Care Provider  Learn more about [facility]'s in-office and virtual care options:  - Get Care Now

## 2023-02-02 ENCOUNTER — Emergency Department (HOSPITAL_COMMUNITY): Payer: Medicare HMO

## 2023-02-02 ENCOUNTER — Encounter (HOSPITAL_COMMUNITY): Payer: Self-pay | Admitting: *Deleted

## 2023-02-02 ENCOUNTER — Observation Stay (HOSPITAL_COMMUNITY)
Admission: EM | Admit: 2023-02-02 | Discharge: 2023-02-03 | Disposition: A | Discharge status: Home / Self Care | Payer: Medicare HMO | Attending: Family Medicine | Admitting: Family Medicine

## 2023-02-02 ENCOUNTER — Other Ambulatory Visit: Payer: Self-pay

## 2023-02-02 DIAGNOSIS — F1010 Alcohol abuse, uncomplicated: Secondary | ICD-10-CM

## 2023-02-02 DIAGNOSIS — S32000A Wedge compression fracture of unspecified lumbar vertebra, initial encounter for closed fracture: Secondary | ICD-10-CM | POA: Diagnosis present

## 2023-02-02 DIAGNOSIS — R519 Headache, unspecified: Secondary | ICD-10-CM | POA: Diagnosis present

## 2023-02-02 DIAGNOSIS — E871 Hypo-osmolality and hyponatremia: Secondary | ICD-10-CM | POA: Diagnosis not present

## 2023-02-02 DIAGNOSIS — W1839XA Other fall on same level, initial encounter: Secondary | ICD-10-CM | POA: Insufficient documentation

## 2023-02-02 DIAGNOSIS — S32010A Wedge compression fracture of first lumbar vertebra, initial encounter for closed fracture: Secondary | ICD-10-CM | POA: Diagnosis not present

## 2023-02-02 DIAGNOSIS — R4182 Altered mental status, unspecified: Secondary | ICD-10-CM | POA: Diagnosis not present

## 2023-02-02 DIAGNOSIS — D72829 Elevated white blood cell count, unspecified: Secondary | ICD-10-CM | POA: Diagnosis not present

## 2023-02-02 DIAGNOSIS — R42 Dizziness and giddiness: Secondary | ICD-10-CM

## 2023-02-02 DIAGNOSIS — R7401 Elevation of levels of liver transaminase levels: Secondary | ICD-10-CM | POA: Diagnosis not present

## 2023-02-02 DIAGNOSIS — I480 Paroxysmal atrial fibrillation: Secondary | ICD-10-CM

## 2023-02-02 DIAGNOSIS — Z7901 Long term (current) use of anticoagulants: Secondary | ICD-10-CM | POA: Diagnosis not present

## 2023-02-02 DIAGNOSIS — R410 Disorientation, unspecified: Principal | ICD-10-CM

## 2023-02-02 DIAGNOSIS — W19XXXA Unspecified fall, initial encounter: Secondary | ICD-10-CM

## 2023-02-02 DIAGNOSIS — Z8711 Personal history of peptic ulcer disease: Secondary | ICD-10-CM

## 2023-02-02 DIAGNOSIS — K7460 Unspecified cirrhosis of liver: Secondary | ICD-10-CM | POA: Diagnosis not present

## 2023-02-02 DIAGNOSIS — S32019A Unspecified fracture of first lumbar vertebra, initial encounter for closed fracture: Secondary | ICD-10-CM | POA: Insufficient documentation

## 2023-02-02 DIAGNOSIS — K760 Fatty (change of) liver, not elsewhere classified: Secondary | ICD-10-CM

## 2023-02-02 DIAGNOSIS — I10 Essential (primary) hypertension: Secondary | ICD-10-CM | POA: Diagnosis present

## 2023-02-02 DIAGNOSIS — Z7952 Long term (current) use of systemic steroids: Secondary | ICD-10-CM | POA: Diagnosis not present

## 2023-02-02 DIAGNOSIS — R748 Abnormal levels of other serum enzymes: Secondary | ICD-10-CM

## 2023-02-02 DIAGNOSIS — K7030 Alcoholic cirrhosis of liver without ascites: Secondary | ICD-10-CM

## 2023-02-02 DIAGNOSIS — Y92009 Unspecified place in unspecified non-institutional (private) residence as the place of occurrence of the external cause: Secondary | ICD-10-CM | POA: Diagnosis not present

## 2023-02-02 DIAGNOSIS — Z79899 Other long term (current) drug therapy: Secondary | ICD-10-CM | POA: Insufficient documentation

## 2023-02-02 DIAGNOSIS — T50905A Adverse effect of unspecified drugs, medicaments and biological substances, initial encounter: Secondary | ICD-10-CM | POA: Diagnosis present

## 2023-02-02 DIAGNOSIS — M109 Gout, unspecified: Secondary | ICD-10-CM | POA: Diagnosis present

## 2023-02-02 DIAGNOSIS — T380X5A Adverse effect of glucocorticoids and synthetic analogues, initial encounter: Secondary | ICD-10-CM | POA: Insufficient documentation

## 2023-02-02 DIAGNOSIS — S3992XA Unspecified injury of lower back, initial encounter: Secondary | ICD-10-CM | POA: Diagnosis not present

## 2023-02-02 LAB — CBC WITH DIFFERENTIAL/PLATELET
Abs Immature Granulocytes: 0.14 10*3/uL — ABNORMAL HIGH (ref 0.00–0.07)
Basophils Absolute: 0.1 10*3/uL (ref 0.0–0.1)
Basophils Relative: 0 %
Eosinophils Absolute: 0 10*3/uL (ref 0.0–0.5)
Eosinophils Relative: 0 %
HCT: 44.9 % (ref 39.0–52.0)
Hemoglobin: 16.6 g/dL (ref 13.0–17.0)
Immature Granulocytes: 1 %
Lymphocytes Relative: 14 %
Lymphs Abs: 1.6 10*3/uL (ref 0.7–4.0)
MCH: 34.5 pg — ABNORMAL HIGH (ref 26.0–34.0)
MCHC: 37 g/dL — ABNORMAL HIGH (ref 30.0–36.0)
MCV: 93.3 fL (ref 80.0–100.0)
Monocytes Absolute: 1.2 10*3/uL — ABNORMAL HIGH (ref 0.1–1.0)
Monocytes Relative: 10 %
Neutro Abs: 8.8 10*3/uL — ABNORMAL HIGH (ref 1.7–7.7)
Neutrophils Relative %: 75 %
Platelets: 189 10*3/uL (ref 150–400)
RBC: 4.81 MIL/uL (ref 4.22–5.81)
RDW: 13.6 % (ref 11.5–15.5)
WBC: 11.8 10*3/uL — ABNORMAL HIGH (ref 4.0–10.5)
nRBC: 0 % (ref 0.0–0.2)

## 2023-02-02 LAB — COMPREHENSIVE METABOLIC PANEL
ALT: 46 U/L — ABNORMAL HIGH (ref 0–44)
AST: 42 U/L — ABNORMAL HIGH (ref 15–41)
Albumin: 4.3 g/dL (ref 3.5–5.0)
Alkaline Phosphatase: 69 U/L (ref 38–126)
Anion gap: 11 (ref 5–15)
BUN: 17 mg/dL (ref 8–23)
CO2: 19 mmol/L — ABNORMAL LOW (ref 22–32)
Calcium: 9.3 mg/dL (ref 8.9–10.3)
Chloride: 98 mmol/L (ref 98–111)
Creatinine, Ser: 1.02 mg/dL (ref 0.61–1.24)
GFR, Estimated: >60 mL/min (ref >60)
Glucose, Bld: 117 mg/dL — ABNORMAL HIGH (ref 70–99)
Potassium: 3.6 mmol/L (ref 3.5–5.1)
Sodium: 128 mmol/L — ABNORMAL LOW (ref 135–145)
Total Bilirubin: 1.2 mg/dL (ref 0.3–1.2)
Total Protein: 7.6 g/dL (ref 6.5–8.1)

## 2023-02-02 LAB — PROTIME-INR
INR: 1.6 — ABNORMAL HIGH (ref 0.8–1.2)
Prothrombin Time: 18.8 seconds — ABNORMAL HIGH (ref 11.4–15.2)

## 2023-02-02 LAB — AMMONIA: Ammonia: 12 umol/L (ref 9–35)

## 2023-02-02 LAB — MAGNESIUM: Magnesium: 1.9 mg/dL (ref 1.7–2.4)

## 2023-02-02 LAB — ETHANOL: Alcohol, Ethyl (B): <10 mg/dL (ref <10)

## 2023-02-02 MED ORDER — NEBIVOLOL HCL 10 MG PO TABS
10.0000 mg | ORAL_TABLET | Freq: Every day | ORAL | Status: DC
  Administered 2023-02-02 – 2023-02-03 (×2): 10 mg via ORAL
  Filled 2023-02-02 (×4): qty 1

## 2023-02-02 MED ORDER — APIXABAN 5 MG PO TABS
5.0000 mg | ORAL_TABLET | Freq: Two times a day (BID) | ORAL | Status: DC
  Administered 2023-02-02 – 2023-02-03 (×2): 5 mg via ORAL
  Filled 2023-02-02 (×2): qty 1

## 2023-02-02 MED ORDER — DILTIAZEM HCL ER COATED BEADS 180 MG PO CP24
180.0000 mg | ORAL_CAPSULE | Freq: Every day | ORAL | Status: DC
  Administered 2023-02-02 – 2023-02-03 (×2): 180 mg via ORAL
  Filled 2023-02-02 (×2): qty 1

## 2023-02-02 MED ORDER — THIAMINE MONONITRATE 100 MG PO TABS
100.0000 mg | ORAL_TABLET | Freq: Every day | ORAL | Status: DC
  Administered 2023-02-02 – 2023-02-03 (×2): 100 mg via ORAL
  Filled 2023-02-02 (×2): qty 1

## 2023-02-02 MED ORDER — HEPARIN SODIUM (PORCINE) 5000 UNIT/ML IJ SOLN
5000.0000 [IU] | Freq: Three times a day (TID) | INTRAMUSCULAR | Status: DC
Start: 2023-02-02 — End: 2023-02-02

## 2023-02-02 MED ORDER — TRAZODONE HCL 50 MG PO TABS: 25.0000 mg | ORAL_TABLET | Freq: Every evening | ORAL | Status: DC | PRN

## 2023-02-02 MED ORDER — ADULT MULTIVITAMIN W/MINERALS CH
1.0000 | ORAL_TABLET | Freq: Every day | ORAL | Status: DC
  Administered 2023-02-02 – 2023-02-03 (×2): 1 via ORAL
  Filled 2023-02-02 (×2): qty 1

## 2023-02-02 MED ORDER — BISACODYL 5 MG PO TBEC: 5.0000 mg | DELAYED_RELEASE_TABLET | Freq: Every day | ORAL | Status: DC | PRN

## 2023-02-02 MED ORDER — LORAZEPAM 1 MG PO TABS: 1.0000 mg | ORAL_TABLET | ORAL | Status: DC | PRN

## 2023-02-02 MED ORDER — THIAMINE HCL 100 MG/ML IJ SOLN: 100.0000 mg | Freq: Every day | INTRAMUSCULAR | Status: DC

## 2023-02-02 MED ORDER — OXYCODONE HCL 5 MG PO TABS: 5.0000 mg | ORAL_TABLET | Freq: Four times a day (QID) | ORAL | Status: DC | PRN

## 2023-02-02 MED ORDER — CHLORDIAZEPOXIDE HCL 5 MG PO CAPS
5.0000 mg | ORAL_CAPSULE | Freq: Three times a day (TID) | ORAL | Status: DC
  Administered 2023-02-02 – 2023-02-03 (×3): 5 mg via ORAL
  Filled 2023-02-02 (×3): qty 1

## 2023-02-02 MED ORDER — LIDOCAINE 5 % EX PTCH
2.0000 | MEDICATED_PATCH | CUTANEOUS | Status: DC
  Administered 2023-02-02: 2 via TRANSDERMAL
  Filled 2023-02-02: qty 2

## 2023-02-02 MED ORDER — MAGNESIUM SULFATE 2 GM/50ML IV SOLN
2.0000 g | Freq: Once | INTRAVENOUS | Status: AC
  Administered 2023-02-02: 2 g via INTRAVENOUS
  Filled 2023-02-02: qty 50

## 2023-02-02 MED ORDER — ORAL CARE MOUTH RINSE: 15.0000 mL | OROMUCOSAL | Status: DC | PRN

## 2023-02-02 MED ORDER — SODIUM CHLORIDE 0.9 % IV SOLN: INTRAVENOUS | Status: AC

## 2023-02-02 MED ORDER — FENTANYL CITRATE PF 50 MCG/ML IJ SOSY
12.5000 ug | PREFILLED_SYRINGE | INTRAMUSCULAR | Status: DC | PRN
  Filled 2023-02-02: qty 1

## 2023-02-02 MED ORDER — PANTOPRAZOLE SODIUM 40 MG PO TBEC
40.0000 mg | DELAYED_RELEASE_TABLET | Freq: Every day | ORAL | Status: DC
  Administered 2023-02-02 – 2023-02-03 (×2): 40 mg via ORAL
  Filled 2023-02-02 (×2): qty 1

## 2023-02-02 MED ORDER — FENTANYL CITRATE PF 50 MCG/ML IJ SOSY
50.0000 ug | PREFILLED_SYRINGE | Freq: Once | INTRAMUSCULAR | Status: AC
  Administered 2023-02-02: 50 ug via INTRAVENOUS
  Filled 2023-02-02: qty 1

## 2023-02-02 MED ORDER — FENTANYL CITRATE PF 50 MCG/ML IJ SOSY: 50.0000 ug | PREFILLED_SYRINGE | Freq: Once | INTRAMUSCULAR | Status: DC

## 2023-02-02 MED ORDER — FOLIC ACID 1 MG PO TABS
1.0000 mg | ORAL_TABLET | Freq: Every day | ORAL | Status: DC
  Administered 2023-02-02 – 2023-02-03 (×2): 1 mg via ORAL
  Filled 2023-02-02 (×2): qty 1

## 2023-02-02 MED ORDER — DICLOFENAC EPOLAMINE 1.3 % EX PTCH
1.0000 | MEDICATED_PATCH | Freq: Two times a day (BID) | CUTANEOUS | Status: DC
  Administered 2023-02-02: 1 via TRANSDERMAL
  Filled 2023-02-02 (×3): qty 1

## 2023-02-02 MED ORDER — PROCHLORPERAZINE EDISYLATE 10 MG/2ML IJ SOLN: 10.0000 mg | INTRAMUSCULAR | Status: DC | PRN

## 2023-02-02 NOTE — ED Provider Notes (Signed)
[place] EMERGENCY DEPARTMENT AT [facility] Provider Note   CSN: [id number] Arrival date & time: 02/02/23  1040     History  Chief Complaint  Patient presents with   Headache    RIGGINS CISEK is a 72 y.o. male.  HPI Patient presents with his wife who assists with the history. Patient has multiple medical problems including A-fib, on Eliquis, now presents with confusion, ongoing back pain. Seem that the patient had a mechanical fall 1 week ago, landing on his backside, without obvious head trauma.  He complained of back pain, nausea, 2 days ago had a video visit with urgent care practitioner.  He was started on baclofen, steroids.  Since yesterday patient has had confusion, memory loss, confabulation no additional fall, trauma, no vomiting, no diarrhea.  Patient answers questions seemingly appropriately, but does not recall any substantial details over the past 24 hours. Level 5 caveat secondary to confusion.     Home Medications Prior to Admission medications   Medication Sig Start Date End Date Taking? Authorizing Provider  apixaban (ELIQUIS) 5 MG TABS tablet Take 1 tablet (5 mg total) by mouth 2 (two) times daily. 04/05/19   Antoine Poche, MD  azelastine (ASTELIN) 0.1 % nasal spray Place 2 sprays into both nostrils 2 (two) times daily. Use in each nostril as directed 07/15/22   Birder Robson, MD  baclofen (LIORESAL) 20 MG tablet Take 1 tablet (20 mg total) by mouth 3 (three) times daily. 01/31/23   [name], PA-C  colchicine 0.6 MG tablet Take 0.6 mg by mouth daily as needed (gout flare).    [provider]  diltiazem (DILACOR XR) 180 MG 24 hr capsule Take 180 mg by mouth daily.    [provider]  fexofenadine (ALLEGRA ALLERGY) 180 MG tablet Take 1 tablet (180 mg total) by mouth daily. 07/15/22   Birder Robson, MD  fluticasone (FLONASE) 50 MCG/ACT nasal spray Place 2 sprays into both nostrils daily. 07/15/22   Birder Robson, MD   ibuprofen (ADVIL) 200 MG tablet Take 800 mg by mouth every 6 (six) hours as needed for headache.    [provider]  LORazepam (ATIVAN) 1 MG tablet Take 1 mg by mouth at bedtime as needed for sleep.  01/07/20   [provider]  methylPREDNISolone (MEDROL DOSEPAK) 4 MG TBPK tablet 6 day taper; take as directed on package instructions 01/31/23   [name], PA-C  nebivolol (BYSTOLIC) 10 MG tablet Take 10 mg by mouth daily. 03/07/22   [provider]  pantoprazole (PROTONIX) 40 MG tablet Take 40 mg by mouth daily.    [provider]  sildenafil (VIAGRA) 100 MG tablet Take 1 tablet (100 mg total) by mouth as needed for erectile dysfunction. 07/11/22 07/11/23  Jerilee Field, MD  valACYclovir (VALTREX) 500 MG tablet Take 500 mg by mouth 4 (four) times daily as needed (cold sores).    [provider]      Allergies    Patient has no known allergies.    Review of Systems   Review of Systems  Unable to perform ROS: Mental status change    Physical Exam Updated Vital Signs BP (!) 143/87   Pulse 80   Temp 98.2 F (36.8 C) (Oral)   Resp 16   Ht 6' (1.829 m)   Wt 113.4 kg   SpO2 97%   BMI 33.91 kg/m  Physical Exam Vitals and nursing note reviewed.  Constitutional:  General: He is not in acute distress.    Appearance: He is well-developed.  HENT:     Head: Normocephalic and atraumatic.  Eyes:     Conjunctiva/sclera: Conjunctivae normal.  Cardiovascular:     Rate and Rhythm: Normal rate and regular rhythm.  Pulmonary:     Effort: Pulmonary effort is normal. No respiratory distress.     Breath sounds: No stridor.  Abdominal:     General: There is no distension.  Skin:    General: Skin is warm and dry.  Neurological:     Mental Status: He is alert.     Comments: Patient is awake, alert, moving all extremity spontaneously to command.  When minimal pressure is applied to his left lower leg he states that he cannot move the  leg. Face is symmetric, speech is clear.  Psychiatric:        Cognition and Memory: Memory is impaired.     ED Results / Procedures / Treatments   Labs (all labs ordered are listed, but only abnormal results are displayed) Labs Reviewed  COMPREHENSIVE METABOLIC PANEL - Abnormal; Notable for the following components:      Result Value   Sodium 128 (*)    CO2 19 (*)    Glucose, Bld 117 (*)    AST 42 (*)    ALT 46 (*)    All other components within normal limits  CBC WITH DIFFERENTIAL/PLATELET - Abnormal; Notable for the following components:   WBC 11.8 (*)    MCH 34.5 (*)    MCHC 37.0 (*)    Neutro Abs 8.8 (*)    Monocytes Absolute 1.2 (*)    Abs Immature Granulocytes 0.14 (*)    All other components within normal limits  ETHANOL  URINALYSIS, ROUTINE W REFLEX MICROSCOPIC    EKG EKG Interpretation  Date/Time:  Sunday February 02 2023 10:58:21 EDT Ventricular Rate:  83 PR Interval:    QRS Duration: 108 QT Interval:  406 QTC Calculation: 478 R Axis:   -11 Text Interpretation: Atrial fibrillation Ventricular premature complex RSR' in V1 or V2, right VCD or RVH Borderline prolonged QT interval Confirmed by Gerhard Munch [phone]) on 02/02/2023 11:14:29 AM  Radiology CT Lumbar Spine Wo Contrast  Result Date: 02/02/2023 CLINICAL DATA:  Back trauma. Concern for fracture versus spinal epidural hematoma. EXAM: CT LUMBAR SPINE WITHOUT CONTRAST TECHNIQUE: Multidetector CT imaging of the lumbar spine was performed without intravenous contrast administration. Multiplanar CT image reconstructions were also generated. RADIATION DOSE REDUCTION: This exam was performed according to the departmental dose-optimization program which includes automated exposure control, adjustment of the mA and/or kV according to patient size and/or use of iterative reconstruction technique. COMPARISON:  None Available. FINDINGS: Segmentation: Five lumbar type vertebrae Alignment: Mild degenerative anterolisthesis  at L5-S1, facet mediated Vertebrae: L1 superior endplate fracture with mild-to-moderate depression, fracture margins appear subacute to chronic. Chronic T12 superior endplate fracture. No evidence of aggressive bone lesion. Paraspinal and other soft tissues: Negative Disc levels: Degenerative facet spurring greatest at L5-S1. Generalized disc height loss with spondylitic spurring. IMPRESSION: 1. L1 compression fracture favored subacute to chronic. 2. 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 Electronically Signed   By: Tiburcio Pea M.D.   On: 02/02/2023 12:50   CT Head Wo Contrast  Result Date: 02/02/2023 CLINICAL DATA:  Mental status change with unknown cause EXAM: CT HEAD WITHOUT CONTRAST TECHNIQUE: Contiguous axial images were obtained from the base of the skull through the vertex without intravenous contrast. RADIATION  DOSE REDUCTION: This exam was performed according to the departmental dose-optimization program which includes automated exposure control, adjustment of the mA and/or kV according to patient size and/or use of iterative reconstruction technique. COMPARISON:  None Available. FINDINGS: Brain: No evidence of acute infarction, hemorrhage, hydrocephalus, extra-axial collection or mass lesion/mass effect. Cerebral volume loss to a mild degree for age. Vascular: No hyperdense vessel or unexpected calcification. Skull: Normal. Negative for fracture or focal lesion. Sinuses/Orbits: No significant finding IMPRESSION: No acute finding, unremarkable for age. Electronically Signed   By: Tiburcio Pea M.D.   On: 02/02/2023 11:58    Procedures Procedures    Medications Ordered in ED Medications  diclofenac (FLECTOR) 1.3 % 1 patch (1 patch Transdermal Patch Applied 02/02/23 1329)  fentaNYL (SUBLIMAZE) injection 50 mcg (has no administration in time range)  fentaNYL (SUBLIMAZE) injection 50 mcg (50 mcg Intravenous Given 02/02/23 1209)    ED Course/ Medical Decision  Making/ A&P                             Medical Decision Making Adult male with multiple medical problems including prior alcoholism, ongoing anticoagulation for A-fib presents with confusion, back pain following a fall that occurred 1 week ago with new medications started 2 days ago.  Broad differential including medication reaction, trauma, intracranial hemorrhage, low back musculoskeletal injury versus fracture, all considered. Patient started with CT, labs, x-ray. Monitor: Pulse ox 100% room air normal Cardiac 85 A-fib abnormal   Amount and/or Complexity of Data Reviewed Independent Historian: spouse External Data Reviewed: notes. Labs: ordered. Decision-making details documented in ED Course. Radiology: ordered and independent interpretation performed. Decision-making details documented in ED Course. ECG/medicine tests: ordered and independent interpretation performed. Decision-making details documented in ED Course.  Risk OTC drugs. Prescription drug management. Decision regarding hospitalization.   2:11 PM On repeat exam the patient continues to require analgesics.  He has had placement of diclofenac patch, IV narcotics, remains hemodynamically unremarkable, though without substantial recall of recent events, and with confusion. I discussed all findings with the patient, and his wife. Head CT unremarkable, there are some suspicion for polypharmacy contributing to his delirium.  With consideration of delirium, in the context of new spinal compression fracture, patient will be admitted for ongoing monitoring, management, including medication consideration. 1 thought is the patient may have adverse effects of baclofen.  I discussed this the patient's wife, and she notes a suspicion that he may have accidentally taken excessive doses of this since being prescribed it 2 days ago.         Final Clinical Impression(s) / ED Diagnoses Final diagnoses:  Delirium  Fall, initial  encounter  Compression fracture of L1 vertebra, initial encounter [facility])     Gerhard Munch, MD 02/02/23 [phone]

## 2023-02-02 NOTE — ED Triage Notes (Signed)
Pt states he fell x one week ago and does not remember the fall or if he was dizzy and fell or mechanical fall  Pt's spouse states yesterday pt started having nausea and dizziness and confusion  Pt c/o headache and back pain

## 2023-02-02 NOTE — H&P (Addendum)
History and Physical  [facility]  Miguel Spencer:[id number] DOB: Jul 08, 1951 DOA: 02/02/2023  PCP: Benita Stabile, MD  Patient coming from: Home  Level of care: Med-Surg  I have personally briefly reviewed patient's old medical records in [facility]  Chief Complaint: confusion, altered mental state  HPI: Miguel Spencer is a 72 year old gentleman who is a functional alcoholic with liver cirrhosis, atrial fibrillation, gout, hypertension, history of peptic ulcer disease, vertigo, history of GI bleed, chronically anticoagulated with apixaban, history of ischemic colitis, married for 1 year and today is his first year wedding anniversary presented to the ED today with complaints of confusion.  About 1 week ago the patient had an alcohol-related fall at home and sustained a lumbar compression fracture at L1.  He was seen on 01/31/2023 remotely through telemedicine at an urgent care and was prescribed a Medrol Dosepak and baclofen 20 mg tablets.  He had declined to come in for an in person evaluation at that time.  His wife reports that he has been having delirium symptoms and talking out of his head and staring into space since taking the medication.  She became very concerned and brought him to the emergency department for further assessment.  Patient says that he normally drinks 6-7 1 pint cans of beer on a daily basis.  He says that he does not have alcohol withdrawal symptoms but he does use lorazepam if he is not drinking for couple of days.  He denies ever having delirium tremens.  His ED workup does reveal a subacute to chronic L1 compression fracture.  He had elevated liver enzymes and sodium 128 and CO2 19 with a glucose of 117.  His WBC was elevated at 11.8 but he has been taking steroids.  His back pain was uncontrolled and he was started on pain management treatments.  He was also notably confused and disoriented and admission requested for further management.    Past Medical  History:  Diagnosis Date   A-fib (HCC)    A-fib (HCC)    Alcohol abuse    Alcoholic (HCC)    Anxiety    Colon polyp    Depression    GI bleed    Gout    Hypertension    Vertigo     Past Surgical History:  Procedure Laterality Date   ADENOIDECTOMY     BIOPSY  02/26/2019   Procedure: BIOPSY;  Surgeon: West Bali, MD;  Location: [facility];  Service: Endoscopy;;  Gastric    BIOPSY  03/05/2019   Procedure: BIOPSY;  Surgeon: Corbin Ade, MD;  Location: [facility];  Service: Endoscopy;;  ascending colon bx   COLONOSCOPY  07/2018   [place] piecemeal fashion status post tattooing, 30 x 18 mm sized sigmoid colon polyp removed via snare status post tattooing.  Recommended repeat colonoscopy in 3 months.  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   COLONOSCOPY WITH PROPOFOL N/A 03/05/2019   Procedure: COLONOSCOPY WITH PROPOFOL;  Surgeon: Corbin Ade, MD;  [facility]: [facility];  Service: Endoscopy;  Laterality: N/A;   ESOPHAGOGASTRODUODENOSCOPY (EGD) WITH PROPOFOL N/A 02/26/2019   Dr. Darrick Penna: LA grade D esophagitis, gastritis with benign biopsies, no H. pylori, 5 nonbleeding superficial duodenal ulcers.  Felt to be NSAID related.   HERNIA REPAIR     inguinal-not sure which side   POLYPECTOMY  03/05/2019  Procedure: POLYPECTOMY;  Surgeon: Corbin Ade, MD;  [facility]: [facility];  Service: Endoscopy;;  cold snare polypectomy   TONSILLECTOMY     UMBILICAL HERNIA REPAIR N/A 07/06/2020   Procedure: UMBILICAL HERNIA REPAIR WITH MESH;  Surgeon: Lucretia Roers, MD;  Location: [facility];  Service: General;  Laterality: N/A;     reports that he has never smoked. He has never used smokeless tobacco. He reports current alcohol use of about 70.0 standard drinks of alcohol per week. He reports that he does not use drugs.  No Known Allergies  Family History   Problem Relation Age of Onset   Cancer Mother    Heart disease Father    Heart disease Brother    Heart disease Other    Colon cancer Neg Hx    Gastric cancer Neg Hx    Esophageal cancer Neg Hx     Prior to Admission medications   Medication Sig Start Date End Date Taking? Authorizing Provider  apixaban (ELIQUIS) 5 MG TABS tablet Take 1 tablet (5 mg total) by mouth 2 (two) times daily. 04/05/19  Yes Branch, Dorothe Pea, MD  baclofen (LIORESAL) 20 MG tablet Take 1 tablet (20 mg total) by mouth 3 (three) times daily. 01/31/23  Yes [name], PA-C  Cholecalciferol (VITAMIN D-3 PO) Take 1 capsule by mouth daily.   Yes [provider]  diltiazem (CARDIZEM CD) 180 MG 24 hr capsule Take 180 mg by mouth daily.   Yes [provider]  ibuprofen (ADVIL) 200 MG tablet Take 800 mg by mouth daily as needed for headache or moderate pain.   Yes [provider]  LORazepam (ATIVAN) 1 MG tablet Take 1 mg by mouth daily. 01/07/20  Yes [provider]  methylPREDNISolone (MEDROL DOSEPAK) 4 MG TBPK tablet 6 day taper; take as directed on package instructions Patient taking differently: Take 4-24 mg by mouth See admin instructions. 6 day dose pack: 24 mg on day 1,  20 mg on day 2, 16 mg on day 3, 12 mg on day 4, 8 mg on day 5, 4 mg on day 6. 01/31/23  Yes Burnette, Alessandra Bevels, PA-C  Multiple Vitamin (MULTIVITAMIN) tablet Take 1 tablet by mouth daily.   Yes [provider]  nebivolol (BYSTOLIC) 10 MG tablet Take 10 mg by mouth daily. 03/07/22  Yes [provider]  pantoprazole (PROTONIX) 40 MG tablet Take 40 mg by mouth daily.   Yes [provider]  sildenafil (VIAGRA) 100 MG tablet Take 1 tablet (100 mg total) by mouth as needed for erectile dysfunction. Patient taking differently: Take 100 mg by mouth daily as needed for erectile dysfunction. 07/11/22 07/11/23 Yes Jerilee Field, MD  valACYclovir (VALTREX) 500 MG tablet Take 500 mg by  mouth 4 (four) times daily as needed (cold sores).   Yes [provider]    Physical Exam: Vitals:   02/02/23 1055 02/02/23 1058 02/02/23 1111 02/02/23 1547  BP: (!) 143/87   (!) 144/100  Pulse: 80   71  Resp: 16   17  Temp: 98.2 F (36.8 C) 98.2 F (36.8 C)  98.5 F (36.9 C)  TempSrc: Oral Oral    SpO2: 97%   97%  Weight:   113.4 kg   Height:   6' (1.829 m)     Constitutional: lying supine, NAD, calm, comfortable Eyes: PERRL, lids and conjunctivae normal ENMT: Mucous membranes are moist. Posterior pharynx clear of any exudate or lesions.Normal dentition.  Neck: normal,  supple, no masses, no thyromegaly Respiratory: clear to auscultation bilaterally, no wheezing, no crackles. Normal respiratory effort. No accessory muscle use.  Cardiovascular: normal s1, s2 sounds, no murmurs / rubs / gallops. No extremity edema. 2+ pedal pulses. No carotid bruits.  Abdomen: no tenderness, no masses palpated. No hepatosplenomegaly. Bowel sounds positive.  Musculoskeletal: very tender lumbar spine at L1 area and paraspinal muscle spasms demonstrated in lumbar spinal area; no clubbing / cyanosis. No joint deformity upper and lower extremities. Good ROM, no contractures. Normal muscle tone.  Skin: no rashes, lesions, ulcers. No induration Neurologic: CN 2-12 grossly intact. Sensation intact, DTR normal. Strength 5/5 in all 4.  Psychiatric: poor judgment and insight. Alert and oriented x 2. Normal mood.   Labs on Admission: I have personally reviewed following labs and imaging studies  CBC: Recent Labs  Lab 02/02/23 1103  WBC 11.8*  NEUTROABS 8.8*  HGB 16.6  HCT 44.9  MCV 93.3  PLT 189   Basic Metabolic Panel: Recent Labs  Lab 02/02/23 1103 02/02/23 1501  NA 128*  --   K 3.6  --   CL 98  --   CO2 19*  --   GLUCOSE 117*  --   BUN 17  --   CREATININE 1.02  --   CALCIUM 9.3  --   MG  --  1.9   GFR: Estimated Creatinine Clearance: 86.3 mL/min (by C-G formula based on SCr  of 1.02 mg/dL). Liver Function Tests: Recent Labs  Lab 02/02/23 1103  AST 42*  ALT 46*  ALKPHOS 69  BILITOT 1.2  PROT 7.6  ALBUMIN 4.3   No results for input(s): "LIPASE", "AMYLASE" in the last 168 hours. Recent Labs  Lab 02/02/23 1501  AMMONIA 12   Coagulation Profile: Recent Labs  Lab 02/02/23 1501  INR 1.6*   Cardiac Enzymes: No results for input(s): "CKTOTAL", "CKMB", "CKMBINDEX", "TROPONINI" in the last 168 hours. BNP (last 3 results) No results for input(s): "PROBNP" in the last 8760 hours. HbA1C: No results for input(s): "HGBA1C" in the last 72 hours. CBG: No results for input(s): "GLUCAP" in the last 168 hours. Lipid Profile: No results for input(s): "CHOL", "HDL", "LDLCALC", "TRIG", "CHOLHDL", "LDLDIRECT" in the last 72 hours. Thyroid Function Tests: No results for input(s): "TSH", "T4TOTAL", "FREET4", "T3FREE", "THYROIDAB" in the last 72 hours. Anemia Panel: No results for input(s): "VITAMINB12", "FOLATE", "FERRITIN", "TIBC", "IRON", "RETICCTPCT" in the last 72 hours. Urine analysis:    Component Value Date/Time   COLORURINE YELLOW 02/20/2013 2236   APPEARANCEUR Clear 09/24/2021 1537   LABSPEC 1.025 02/20/2013 2236   PHURINE 6.0 02/20/2013 2236   GLUCOSEU Negative 09/24/2021 1537   HGBUR MODERATE (A) 02/20/2013 2236   BILIRUBINUR Negative 09/24/2021 1537   KETONESUR TRACE (A) 02/20/2013 2236   PROTEINUR Negative 09/24/2021 1537   PROTEINUR NEGATIVE 02/20/2013 2236   UROBILINOGEN 0.2 02/20/2013 2236   NITRITE Negative 09/24/2021 1537   NITRITE NEGATIVE 02/20/2013 2236   LEUKOCYTESUR Negative 09/24/2021 1537    Radiological Exams on Admission: CT Lumbar Spine Wo Contrast  Result Date: 02/02/2023 CLINICAL DATA:  Back trauma. Concern for fracture versus spinal epidural hematoma. EXAM: CT LUMBAR SPINE WITHOUT CONTRAST TECHNIQUE: Multidetector CT imaging of the lumbar spine was performed without intravenous contrast administration. Multiplanar CT image  reconstructions were also generated. RADIATION DOSE REDUCTION: This exam was performed according to the departmental dose-optimization program which includes automated exposure control, adjustment of the mA and/or kV according to patient size and/or use of iterative reconstruction technique.  COMPARISON:  None Available. FINDINGS: Segmentation: Five lumbar type vertebrae Alignment: Mild degenerative anterolisthesis at L5-S1, facet mediated Vertebrae: L1 superior endplate fracture with mild-to-moderate depression, fracture margins appear subacute to chronic. Chronic T12 superior endplate fracture. No evidence of aggressive bone lesion. Paraspinal and other soft tissues: Negative Disc levels: Degenerative facet spurring greatest at L5-S1. Generalized disc height loss with spondylitic spurring. IMPRESSION: 1. L1 compression fracture favored subacute to chronic. 2. 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 Electronically Signed   By: Tiburcio Pea M.D.   On: 02/02/2023 12:50   CT Head Wo Contrast  Result Date: 02/02/2023 CLINICAL DATA:  Mental status change with unknown cause EXAM: CT HEAD WITHOUT CONTRAST TECHNIQUE: Contiguous axial images were obtained from the base of the skull through the vertex without intravenous contrast. RADIATION DOSE REDUCTION: This exam was performed according to the departmental dose-optimization program which includes automated exposure control, adjustment of the mA and/or kV according to patient size and/or use of iterative reconstruction technique. COMPARISON:  None Available. FINDINGS: Brain: No evidence of acute infarction, hemorrhage, hydrocephalus, extra-axial collection or mass lesion/mass effect. Cerebral volume loss to a mild degree for age. Vascular: No hyperdense vessel or unexpected calcification. Skull: Normal. Negative for fracture or focal lesion. Sinuses/Orbits: No significant finding IMPRESSION: No acute finding, unremarkable for age.  Electronically Signed   By: Tiburcio Pea M.D.   On: 02/02/2023 11:58    EKG: Independently reviewed. Atrial fibrillation with controlled ventricular rate   Assessment/Plan Principal Problem:   Acute delirium Active Problems:   Hypertension   Gout   Alcohol abuse   History of peptic ulcer disease   Hepatic cirrhosis (HCC)   AF (paroxysmal atrial fibrillation) (HCC)   Fall at home   Hyponatremia   Elevated transaminase level   Leukocytosis   Lumbar compression fracture (HCC)   Vertigo   Adverse effects of medication    Adverse effects of medication and acute delirium  -suspect confusion is result of taking prednisone, baclofen along with alcohol consumption -hold all steroids at this time -hold baclofen at this time -stop alcohol usage at this time -hydrating with IV fluid  Fall at home -pt admits he was drinking heavy alcohol at the time and this was an alcohol related fall -fall precautions -PT eval requested -alcohol cessation advised   L1 compression fracture  -pain management -TLSO brace ordered -PT eval requested -alternate heat/ice to affected area  -IV and oral pain medication ordered  Functional Alcoholic Chronic alcohol abuse Alcohol dependence -at length conversation with patient  -he wants to stop alcohol at this time -will consult TOC for treatment options -CIWA protocol ordered  -supplement vitamins : thiamine, folic acid -check urinalysis tox screen  -librium 5 mg TID ordered for now  Leukocytosis  -leukemoid reaction from recent steroid use -no s/s of infection found  Paroxysmal atrial fibrillation -continue apixaban for full anticoagulation  -continue diltiazem for HR control -counseled that if he continued to drink alcohol and fall he would not be a candidate to continue full dose anticoagulation   Hepatic cirrhosis -elevated transaminase levels noted -pt counseled at bedside  -should follow up with GI outpatient for ongoing  surveillance -strong emphasis on alcohol cessation  -PT/INR pending  -[place] elastography liver ordered   GERD/h/o peptic ulcer disease -resume pantoprazole 40 mg for GI protection   Essential Hypertension  -resumed home BP medication and follow   DVT prophylaxis: apixaban   Code Status: full   Family Communication: wife at bedside  Disposition Plan: anticipate home with [facility] pending PT recs   Consults called: PT   Admission status: INP  Level of care: Med-Surg Standley Dakins MD [facility] How to contact the [facility] Attending or Consulting provider 7A - 7P or covering provider during after hours 7P -7A, for this patient?  Check the care team in [facility] and look for a) attending/consulting [facility] provider listed and b) the [facility] team listed Log into [email or web address] and use [place]'s universal password to access. If you do not have the password, please contact the hospital operator. Locate the [facility] provider you are looking for under [facility] and page to a number that you can be directly reached. If you still have difficulty reaching the provider, please page the [facility] (Director on Call) for the Hospitalists listed on amion for assistance.   If 7PM-7AM, please contact night-coverage [email or web address] Password [facility]  02/02/2023, 4:07 PM

## 2023-02-02 NOTE — ED Notes (Signed)
Pt asked about urine specimen. Pt does not feel like he would be able to provide one.

## 2023-02-02 NOTE — Hospital Course (Signed)
72 year old gentleman who is a functional alcoholic with liver cirrhosis, atrial fibrillation, gout, hypertension, history of peptic ulcer disease, vertigo, history of GI bleed, chronically anticoagulated with apixaban, history of ischemic colitis, married for 1 year and today is his first year wedding anniversary presented to the ED today with complaints of confusion.  About 1 week ago the patient had an alcohol-related fall at home and sustained a lumbar compression fracture at L1.  He was seen on 01/31/2023 remotely through telemedicine at an urgent care and was prescribed a Medrol Dosepak and baclofen 20 mg tablets.  He had declined to come in for an in person evaluation at that time.  His wife reports that he has been having delirium symptoms and talking out of his head and staring into space since taking the medication.  She became very concerned and brought him to the emergency department for further assessment.  Patient says that he normally drinks 6-7 1 pint cans of beer on a daily basis.  He says that he does not have alcohol withdrawal symptoms but he does use lorazepam if he is not drinking for couple of days.  He denies ever having delirium tremens.  His ED workup does reveal a subacute to chronic L1 compression fracture.  He had elevated liver enzymes and sodium 128 and CO2 19 with a glucose of 117.  His WBC was elevated at 11.8 but he has been taking steroids.  His back pain was uncontrolled and he was started on pain management treatments.  He was also notably confused and disoriented and admission requested for further management.

## 2023-02-03 ENCOUNTER — Inpatient Hospital Stay (HOSPITAL_COMMUNITY): Payer: Medicare HMO

## 2023-02-03 DIAGNOSIS — S32010D Wedge compression fracture of first lumbar vertebra, subsequent encounter for fracture with routine healing: Secondary | ICD-10-CM

## 2023-02-03 DIAGNOSIS — R410 Disorientation, unspecified: Secondary | ICD-10-CM | POA: Diagnosis not present

## 2023-02-03 DIAGNOSIS — D72829 Elevated white blood cell count, unspecified: Secondary | ICD-10-CM | POA: Diagnosis not present

## 2023-02-03 DIAGNOSIS — K760 Fatty (change of) liver, not elsewhere classified: Secondary | ICD-10-CM | POA: Diagnosis not present

## 2023-02-03 DIAGNOSIS — E871 Hypo-osmolality and hyponatremia: Secondary | ICD-10-CM | POA: Diagnosis not present

## 2023-02-03 DIAGNOSIS — I480 Paroxysmal atrial fibrillation: Secondary | ICD-10-CM | POA: Diagnosis not present

## 2023-02-03 LAB — CBC WITH DIFFERENTIAL/PLATELET
Abs Immature Granulocytes: 0.12 10*3/uL — ABNORMAL HIGH (ref 0.00–0.07)
Basophils Absolute: 0.1 10*3/uL (ref 0.0–0.1)
Basophils Relative: 1 %
Eosinophils Absolute: 0 10*3/uL (ref 0.0–0.5)
Eosinophils Relative: 0 %
HCT: 45.9 % (ref 39.0–52.0)
Hemoglobin: 16.1 g/dL (ref 13.0–17.0)
Immature Granulocytes: 2 %
Lymphocytes Relative: 27 %
Lymphs Abs: 1.9 10*3/uL (ref 0.7–4.0)
MCH: 34.1 pg — ABNORMAL HIGH (ref 26.0–34.0)
MCHC: 35.1 g/dL (ref 30.0–36.0)
MCV: 97.2 fL (ref 80.0–100.0)
Monocytes Absolute: 1.1 10*3/uL — ABNORMAL HIGH (ref 0.1–1.0)
Monocytes Relative: 16 %
Neutro Abs: 3.7 10*3/uL (ref 1.7–7.7)
Neutrophils Relative %: 54 %
Platelets: 170 10*3/uL (ref 150–400)
RBC: 4.72 MIL/uL (ref 4.22–5.81)
RDW: 14.4 % (ref 11.5–15.5)
WBC: 7 10*3/uL (ref 4.0–10.5)
nRBC: 0 % (ref 0.0–0.2)

## 2023-02-03 LAB — MAGNESIUM: Magnesium: 2.2 mg/dL (ref 1.7–2.4)

## 2023-02-03 LAB — COMPREHENSIVE METABOLIC PANEL
ALT: 53 U/L — ABNORMAL HIGH (ref 0–44)
AST: 53 U/L — ABNORMAL HIGH (ref 15–41)
Albumin: 3.9 g/dL (ref 3.5–5.0)
Alkaline Phosphatase: 62 U/L (ref 38–126)
Anion gap: 10 (ref 5–15)
BUN: 20 mg/dL (ref 8–23)
CO2: 24 mmol/L (ref 22–32)
Calcium: 8.6 mg/dL — ABNORMAL LOW (ref 8.9–10.3)
Chloride: 100 mmol/L (ref 98–111)
Creatinine, Ser: 0.97 mg/dL (ref 0.61–1.24)
GFR, Estimated: >60 mL/min (ref >60)
Glucose, Bld: 97 mg/dL (ref 70–99)
Potassium: 3.8 mmol/L (ref 3.5–5.1)
Sodium: 134 mmol/L — ABNORMAL LOW (ref 135–145)
Total Bilirubin: 1 mg/dL (ref 0.3–1.2)
Total Protein: 7 g/dL (ref 6.5–8.1)

## 2023-02-03 MED ORDER — OXYCODONE HCL 5 MG PO TABS: 5.0000 mg | ORAL_TABLET | Freq: Three times a day (TID) | ORAL | 0 refills | Status: AC | PRN

## 2023-02-03 MED ORDER — LIDOCAINE 5 % EX PTCH: 2.0000 | MEDICATED_PATCH | CUTANEOUS | 0 refills | Status: DC

## 2023-02-03 MED ORDER — VITAMIN B-1 100 MG PO TABS: 100.0000 mg | ORAL_TABLET | Freq: Every day | ORAL | 1 refills | Status: DC

## 2023-02-03 MED ORDER — FOLIC ACID 1 MG PO TABS: 1.0000 mg | ORAL_TABLET | Freq: Every day | ORAL | 1 refills | Status: DC

## 2023-02-03 NOTE — Progress Notes (Signed)
PROGRESS NOTE   Miguel Spencer  WUJ:[id number] DOB: [birth year] DOA: 02/02/2023 PCP: Benita Stabile, MD   Chief Complaint  Patient presents with   Headache   Level of care: Med-Surg  Brief Admission History:  72 year old gentleman who is a functional alcoholic with liver cirrhosis, atrial fibrillation, gout, hypertension, history of peptic ulcer disease, vertigo, history of GI bleed, chronically anticoagulated with apixaban, history of ischemic colitis, married for 1 year and today is his first year wedding anniversary presented to the ED today with complaints of confusion.  About 1 week ago the patient had an alcohol-related fall at home and sustained a lumbar compression fracture at L1.  He was seen on 01/31/2023 remotely through telemedicine at an urgent care and was prescribed a Medrol Dosepak and baclofen 20 mg tablets.  He had declined to come in for an in person evaluation at that time.  His wife reports that he has been having delirium symptoms and talking out of his head and staring into space since taking the medication.  She became very concerned and brought him to the emergency department for further assessment.  Patient says that he normally drinks 6-7 1 pint cans of beer on a daily basis.  He says that he does not have alcohol withdrawal symptoms but he does use lorazepam if he is not drinking for couple of days.  He denies ever having delirium tremens.  His ED workup does reveal a subacute to chronic L1 compression fracture.  He had elevated liver enzymes and sodium 128 and CO2 19 with a glucose of 117.  His WBC was elevated at 11.8 but he has been taking steroids.  His back pain was uncontrolled and he was started on pain management treatments.  He was also notably confused and disoriented and admission requested for further management.   Assessment and Plan: Adverse effects of medication and acute delirium  -suspect confusion is result of taking prednisone, baclofen along with alcohol  consumption -hold all steroids at this time -hold baclofen at this time -stop alcohol usage at this time -hydrating with IV fluid has helped -pt back to baseline mental status  -monitoring for acute alcohol withdrawal    Fall at home -pt admits he was drinking heavy alcohol at the time and this was an alcohol related fall -fall precautions -PT eval requested -alcohol cessation advised    L1 compression fracture  -pain management -TLSO brace ordered but has not been delivered to patient  -PT eval requested -alternate heat/ice to affected area  -IV and oral pain medication required at this time -may need to refer for kyphoplasty if unable to get pain controlled better   Functional Alcoholic Chronic alcohol abuse Alcohol dependence -at length conversation with patient  -he wants to stop alcohol at this time -will consult TOC for treatment options -CIWA protocol ordered  -supplement vitamins : thiamine, folic acid -check urinalysis tox screen  -librium 5 mg TID ordered for now   Leukocytosis  -leukemoid reaction from recent steroid use -no s/s of infection found -WBC normal today    Paroxysmal atrial fibrillation -continue apixaban for full anticoagulation  -continue diltiazem for HR control -counseled that if he continued to drink alcohol and fall he would not be a candidate to continue full dose anticoagulation    Hepatic cirrhosis -elevated transaminase levels noted -pt counseled at bedside  -should follow up with GI outpatient for ongoing surveillance -strong emphasis on alcohol cessation  -PT/INR - elevated 18.8/1.6  -[place] elastography liver  ordered  - - > results suggesting fatty liver infiltration   GERD/h/o peptic ulcer disease -resume pantoprazole 40 mg for GI protection    Essential Hypertension  -resumed home BP medication and follow    DVT prophylaxis: apixaban Code Status: full  Family Communication: wife Disposition: Status is: Inpatient Remains  inpatient appropriate because: uncontrolled pain   Consultants:   Procedures:  US liver elastography - - > Fatty liver infiltration Antimicrobials:    Subjective: Pt still having a lot of back pain but has been able to ambulate, waiting on TLSO brace to be delivered.    Objective: Vitals:   02/02/23 1111 02/02/23 1547 02/02/23 1948 02/03/23 0427  BP:  (!) 144/100 101/73 (!) 145/94  Pulse:  71 83 69  Resp:  17 18 17   Temp:  98.5 F (36.9 C) 98.1 F (36.7 C) 98 F (36.7 C)  TempSrc:      SpO2:  97% 98% 96%  Weight: 113.4 kg     Height: 6' (1.829 m)       Intake/Output Summary (Last 24 hours) at 02/03/2023 1328 Last data filed at 02/03/2023 1041 Gross per 24 hour  Intake 1518.91 ml  Output 775 ml  Net 743.91 ml   Filed Weights   02/02/23 1111  Weight: 113.4 kg   Examination:  General exam: Appears calm and comfortable  Respiratory system: Clear to auscultation. Respiratory effort normal. Cardiovascular system: normal S1 & S2 heard. No JVD, murmurs, rubs, gallops or clicks. No pedal edema. Gastrointestinal system: Abdomen is nondistended, soft and nontender. No organomegaly or masses felt. Normal bowel sounds heard. Central nervous system: Alert and oriented. No focal neurological deficits. Extremities: Symmetric 5 x 5 power. Skin: No rashes, lesions or ulcers. Psychiatry: Judgement and insight appear normal. Mood & affect appropriate.   Data Reviewed: I have personally reviewed following labs and imaging studies  CBC: Recent Labs  Lab 02/02/23 1103 02/03/23 0512  WBC 11.8* 7.0  NEUTROABS 8.8* 3.7  HGB 16.6 16.1  HCT 44.9 45.9  MCV 93.3 97.2  PLT 189 170    Basic Metabolic Panel: Recent Labs  Lab 02/02/23 1103 02/02/23 1501 02/03/23 0512  NA 128*  --  134*  K 3.6  --  3.8  CL 98  --  100  CO2 19*  --  24  GLUCOSE 117*  --  97  BUN 17  --  20  CREATININE 1.02  --  0.97  CALCIUM 9.3  --  8.6*  MG  --  1.9 2.2    CBG: No results for input(s):  "GLUCAP" in the last 168 hours.  No results found for this or any previous visit (from the past 240 hour(s)).   Radiology Studies: [place] ELASTOGRAPHY LIVER  Result Date: 02/03/2023 CLINICAL DATA:  Cirrhosis EXAM: [place] ELASTOGRAPHY HEPATIC TECHNIQUE: Sonography of the liver was performed. In addition, ultrasound elastography evaluation of the liver was performed. A region of interest was placed within the right lobe of the liver. Following application of a compressive sonographic pulse, tissue compressibility was assessed. Multiple assessments were performed at the selected site. Median tissue compressibility was determined. Previously, hepatic stiffness was assessed by shear wave velocity. Based on recently published Society of Radiologists in Ultrasound consensus article, reporting is now recommended to be performed in the SI units of pressure (kiloPascals) representing hepatic stiffness/elasticity. The obtained result is compared to the published reference standards. (cACLD = compensated Advanced Chronic Liver Disease) COMPARISON:  CT 2020.  Ultrasound of the 18  20 FINDINGS: Liver: Diffusely echogenic hepatic parenchyma consistent with fatty liver infiltration. With this level of echogenicity evaluation for underlying mass lesion is limited and if needed follow-up contrast CT or MRI as clinically appropriate. Portal vein is poorly seen today. ULTRASOUND HEPATIC ELASTOGRAPHY Device: Siemens Helix VTQ Patient position: Oblique Transducer: 9C2 Number of measurements: 12 Hepatic segment:  8 Median kPa: 7.2 IQR: 2.4 IQR/Median kPa ratio: 0.33 Data quality: IQR/Median kPa ratio of 0.3 or greater indicates reduced accuracy Diagnostic category: < or = 9 kPa: in the absence of other known clinical signs, rules out cACLD The use of hepatic elastography is applicable to patients with viral hepatitis and non-alcoholic fatty liver disease. At this time, there is insufficient data for the referenced cut-off values and use in  other causes of liver disease, including alcoholic liver disease. Patients, however, may be assessed by elastography and serve as their own reference standard/baseline. In patients with non-alcoholic liver disease, the values suggesting compensated advanced chronic liver disease (cACLD) may be lower, and patients may need additional testing with elasticity results of 7-9 kPa. Please note that abnormal hepatic elasticity and shear wave velocities may also be identified in clinical settings other than with hepatic fibrosis, such as: acute hepatitis, elevated right heart and central venous pressures including use of beta blockers, veno-occlusive disease (Budd-Chiari), infiltrative processes such as mastocytosis/amyloidosis/infiltrative tumor/lymphoma, extrahepatic cholestasis, with hyperemia in the post-prandial state, and with liver transplantation. Correlation with patient history, laboratory data, and clinical condition recommended. Diagnostic Categories: < or =5 kPa: high probability of being normal < or =9 kPa: in the absence of other known clinical signs, rules out cACLD >9 kPa and ?13 kPa: suggestive of cACLD, but needs further testing >13 kPa: highly suggestive of cACLD > or =17 kPa: highly suggestive of cACLD with an increased probability of clinically significant portal hypertension IMPRESSION: ULTRASOUND LIVER: Fatty liver infiltration. ULTRASOUND HEPATIC ELASTOGRAPHY: Median kPa:  7.2 Diagnostic category: < or = 9 kPa: in the absence of other known clinical signs, rules out cACLD Electronically Signed   By: Karen Kays M.D.   On: 02/03/2023 09:53   CT Lumbar Spine Wo Contrast  Result Date: 02/02/2023 CLINICAL DATA:  Back trauma. Concern for fracture versus spinal epidural hematoma. EXAM: CT LUMBAR SPINE WITHOUT CONTRAST TECHNIQUE: Multidetector CT imaging of the lumbar spine was performed without intravenous contrast administration. Multiplanar CT image reconstructions were also generated. RADIATION  DOSE REDUCTION: This exam was performed according to the departmental dose-optimization program which includes automated exposure control, adjustment of the mA and/or kV according to patient size and/or use of iterative reconstruction technique. COMPARISON:  None Available. FINDINGS: Segmentation: Five lumbar type vertebrae Alignment: Mild degenerative anterolisthesis at L5-S1, facet mediated Vertebrae: L1 superior endplate fracture with mild-to-moderate depression, fracture margins appear subacute to chronic. Chronic T12 superior endplate fracture. No evidence of aggressive bone lesion. Paraspinal and other soft tissues: Negative Disc levels: Degenerative facet spurring greatest at L5-S1. Generalized disc height loss with spondylitic spurring. IMPRESSION: 1. L1 compression fracture favored subacute to chronic. 2. 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 Electronically Signed   By: Tiburcio Pea M.D.   On: 02/02/2023 12:50   CT Head Wo Contrast  Result Date: 02/02/2023 CLINICAL DATA:  Mental status change with unknown cause EXAM: CT HEAD WITHOUT CONTRAST TECHNIQUE: Contiguous axial images were obtained from the base of the skull through the vertex without intravenous contrast. RADIATION DOSE REDUCTION: This exam was performed according to the departmental dose-optimization program which includes automated  exposure control, adjustment of the mA and/or kV according to patient size and/or use of iterative reconstruction technique. COMPARISON:  None Available. FINDINGS: Brain: No evidence of acute infarction, hemorrhage, hydrocephalus, extra-axial collection or mass lesion/mass effect. Cerebral volume loss to a mild degree for age. Vascular: No hyperdense vessel or unexpected calcification. Skull: Normal. Negative for fracture or focal lesion. Sinuses/Orbits: No significant finding IMPRESSION: No acute finding, unremarkable for age. Electronically Signed   By: Tiburcio Pea M.D.   On:  02/02/2023 11:58    Scheduled Meds:  apixaban  5 mg Oral BID   chlordiazePOXIDE  5 mg Oral TID   diltiazem  180 mg Oral Daily   folic acid  1 mg Oral Daily   lidocaine  2 patch Transdermal Q24H   multivitamin with minerals  1 tablet Oral Daily   nebivolol  10 mg Oral Daily   pantoprazole  40 mg Oral Daily   thiamine  100 mg Oral Daily   Or   thiamine  100 mg Intravenous Daily   Continuous Infusions:  sodium chloride 65 mL/hr at 02/03/23 1041     LOS: 1 day   Time spent: 37 mins  Axtyn Woehler Laural Benes, MD How to contact the [facility] Attending or Consulting provider 7A - 7P or covering provider during after hours 7P -7A, for this patient?  Check the care team in [facility] and look for a) attending/consulting TRH provider listed and b) the [facility] team listed Log into [email or web address] and use [place]'s universal password to access. If you do not have the password, please contact the hospital operator. Locate the [facility] provider you are looking for under [facility] and page to a number that you can be directly reached. If you still have difficulty reaching the provider, please page the [facility] (Director on Call) for the Hospitalists listed on amion for assistance.  02/03/2023, 1:28 PM

## 2023-02-03 NOTE — Care Management CC44 (Signed)
Condition Code 44 Documentation Completed  Patient Details  Name: Miguel Spencer MRN: [id number] Date of Birth: September 08, 1951   Condition Code 44 given:  Yes Patient signature on Condition Code 44 notice:  Yes Documentation of 2 MD's agreement:  Yes Code 44 added to claim:  Yes    Karn Cassis, LCSW 02/03/2023, 3:15 PM

## 2023-02-03 NOTE — Care Management Obs Status (Signed)
MEDICARE OBSERVATION STATUS NOTIFICATION   Patient Details  Name: Miguel Spencer MRN: [id number] Date of Birth: 1951-09-16   Medicare Observation Status Notification Given:  Yes    Karn Cassis, LCSW 02/03/2023, 3:15 PM

## 2023-02-03 NOTE — Progress Notes (Signed)
Mobility Specialist Progress Note:   02/03/23 1045  Mobility  Activity Ambulated with assistance in hallway  Level of Assistance Modified independent, requires aide device or extra time  Assistive Device Other (Comment) (IV pole)  Distance Ambulated (ft) 200 ft  Activity Response Tolerated well  Mobility Referral Yes  $Mobility charge 1 Mobility   Pt agreeable to mobility session. Tolerated well, asx throughout. ModI required with IV pole for stability. Returned pt to room, wife at bedside,all needs met.   Feliciana Rossetti Mobility Specialist Please contact via Special educational needs teacher or  Rehab office at [phone]

## 2023-02-03 NOTE — Discharge Instructions (Signed)

## 2023-02-03 NOTE — TOC Progression Note (Signed)
Transition of Care [facility]) - Progression Note    Patient Details  Name: Miguel Spencer MRN: [id number] Date of Birth: Apr 25, 1951  Transition of Care [facility]) CM/SW Contact  Karn Cassis, [place] Phone Number: 02/03/2023, 1:39 PM  Clinical Narrative: LCSW called [facility] and notified them of order for TLSO brace.         Barriers to Discharge: Continued Medical Work up  Expected Discharge Plan and Services                                               Social Determinants of Health (SDOH) Interventions SDOH Screenings   Food Insecurity: No Food Insecurity (02/02/2023)  Housing: Low Risk  (02/02/2023)  Transportation Needs: No Transportation Needs (02/02/2023)  Utilities: Not At Risk (02/02/2023)  Tobacco Use: Low Risk  (02/02/2023)    Readmission Risk Interventions     No data to display

## 2023-02-03 NOTE — TOC Progression Note (Signed)
Transition of Care [facility]) - Progression Note    Patient Details  Name: Miguel Spencer MRN: [id number] Date of Birth: [birth year]  Transition of Care [facility]) CM/SW Contact  Karn Cassis, [place] Phone Number: 02/03/2023, 9:11 AM  Clinical Narrative:  TOC received consult for alcohol abuse. Pt states, "I don't recognize it as a problem, but doctors always do."  Discussed resources and pt said, "I've done the resource thing and it's not for me." No other needs reported at this time. TOC will continue to follow.       Barriers to Discharge: Continued Medical Work up  Expected Discharge Plan and Services                                               Social Determinants of Health (SDOH) Interventions SDOH Screenings   Food Insecurity: No Food Insecurity (02/02/2023)  Housing: Low Risk  (02/02/2023)  Transportation Needs: No Transportation Needs (02/02/2023)  Utilities: Not At Risk (02/02/2023)  Tobacco Use: Low Risk  (02/02/2023)    Readmission Risk Interventions     No data to display

## 2023-02-03 NOTE — Discharge Summary (Signed)
Physician Discharge Summary  Miguel LAVALLEE WUJ:[id number] DOB: [birth year] DOA: 02/02/2023  PCP: Benita Stabile, MD  Admit date: 02/02/2023 Discharge date: 02/03/2023  Admitted From:  Home  Disposition: Home   Recommendations for Outpatient Follow-up:  Follow up with PCP in 3-7 days If not improving, consider referral for kyphoplasty  Home Health: declined, also did not want to wait for TLSO brace to be delivered  Discharge Condition: STABLE   CODE STATUS: FULL DIET: resume prior home diet    Brief Hospitalization Summary: Please see all hospital notes, images, labs for full details of the hospitalization. Brief Admission History:  72 year old gentleman who is a functional alcoholic with liver cirrhosis, atrial fibrillation, gout, hypertension, history of peptic ulcer disease, vertigo, history of GI bleed, chronically anticoagulated with apixaban, history of ischemic colitis, married for 1 year and today is his first year wedding anniversary presented to the ED today with complaints of confusion.  About 1 week ago the patient had an alcohol-related fall at home and sustained a lumbar compression fracture at L1.  He was seen on 01/31/2023 remotely through telemedicine at an urgent care and was prescribed a Medrol Dosepak and baclofen 20 mg tablets.  He had declined to come in for an in person evaluation at that time.  His wife reports that he has been having delirium symptoms and talking out of his head and staring into space since taking the medication.  She became very concerned and brought him to the emergency department for further assessment.  Patient says that he normally drinks 6-7 1 pint cans of beer on a daily basis.  He says that he does not have alcohol withdrawal symptoms but he does use lorazepam if he is not drinking for couple of days.  He denies ever having delirium tremens.   His ED workup does reveal a subacute to chronic L1 compression fracture.  He had elevated liver enzymes and  sodium 128 and CO2 19 with a glucose of 117.  His WBC was elevated at 11.8 but he has been taking steroids.  His back pain was uncontrolled and he was started on pain management treatments.  He was also notably confused and disoriented and admission requested for further management.   Assessment and Plan: Adverse effects of medication and acute delirium  -suspect confusion is result of taking prednisone, baclofen along with alcohol consumption -hold all steroids at this time -hold baclofen at this time -stop alcohol usage at this time -hydrating with IV fluid has helped -pt back to baseline mental status  -monitoring for acute alcohol withdrawal    Fall at home -pt admits he was drinking heavy alcohol at the time and this was an alcohol related fall -fall precautions -PT eval requested -alcohol cessation advised    L1 compression fracture  -pain management -TLSO brace ordered but has not been delivered to patient - he decided not to wait for it -PT eval requested - pt declined home health  -alternate heat/ice to affected area  -IV and oral pain medication required at this time -may need to refer for kyphoplasty if unable to get pain controlled better -Pt to follow up with PCP later this week to recheck    Functional Alcoholic Chronic alcohol abuse Alcohol dependence -at length conversation with patient  -he wants to stop alcohol at this time -will consult TOC for treatment options -CIWA protocol ordered  -supplement vitamins : thiamine, folic acid -check urinalysis tox screen  -librium 5 mg TID ordered in hospital -  no acute withdrawal symptoms    Leukocytosis  -leukemoid reaction from recent steroid use -no s/s of infection found -WBC normal today    Paroxysmal atrial fibrillation -continue apixaban for full anticoagulation  -continue diltiazem for HR control -counseled that if he continued to drink alcohol and fall he would not be a candidate to continue full dose  anticoagulation    Hepatic cirrhosis -elevated transaminase levels noted -pt counseled at bedside  -should follow up with GI outpatient for ongoing surveillance -strong emphasis on alcohol cessation  -PT/INR - elevated 18.8/1.6  -[place] elastography liver ordered  - - > results suggesting fatty liver infiltration -ambulatory referral to [facility] GI made at discharge    GERD/h/o peptic ulcer disease -resume pantoprazole 40 mg for GI protection    Essential Hypertension  -resumed home BP medication and follow      Discharge Diagnoses:  Principal Problem:   Acute delirium Active Problems:   Hypertension   Gout   Alcohol abuse   History of peptic ulcer disease   Hepatic cirrhosis (HCC)   AF (paroxysmal atrial fibrillation) (HCC)   Fall at home   Hyponatremia   Elevated transaminase level   Leukocytosis   Lumbar compression fracture (HCC)   Vertigo   Adverse effects of medication   Discharge Instructions: Discharge Instructions     Ambulatory referral to Gastroenterology   Complete by: As directed    What is the reason for referral?: Other      Allergies as of 02/03/2023   No Known Allergies      Medication List     STOP taking these medications    baclofen 20 MG tablet Commonly known as: LIORESAL   ibuprofen 200 MG tablet Commonly known as: ADVIL   methylPREDNISolone 4 MG Tbpk tablet Commonly known as: MEDROL DOSEPAK       TAKE these medications    apixaban 5 MG Tabs tablet Commonly known as: Eliquis Take 1 tablet (5 mg total) by mouth 2 (two) times daily.   diltiazem 180 MG 24 hr capsule Commonly known as: CARDIZEM CD Take 180 mg by mouth daily.   folic acid 1 MG tablet Commonly known as: FOLVITE Take 1 tablet (1 mg total) by mouth daily. Start taking on: February 04, 2023   lidocaine 5 % Commonly known as: LIDODERM Place 2 patches onto the skin daily. Remove & Discard patch within 12 hours or as directed by MD   LORazepam 1 MG  tablet Commonly known as: ATIVAN Take 1 mg by mouth daily.   multivitamin tablet Take 1 tablet by mouth daily.   nebivolol 10 MG tablet Commonly known as: BYSTOLIC Take 10 mg by mouth daily.   oxyCODONE 5 MG immediate release tablet Commonly known as: Roxicodone Take 1 tablet (5 mg total) by mouth every 8 (eight) hours as needed for up to 4 days for severe pain.   pantoprazole 40 MG tablet Commonly known as: PROTONIX Take 40 mg by mouth daily.   sildenafil 100 MG tablet Commonly known as: Viagra Take 1 tablet (100 mg total) by mouth as needed for erectile dysfunction. What changed: when to take this   thiamine 100 MG tablet Commonly known as: Vitamin B-1 Take 1 tablet (100 mg total) by mouth daily. Start taking on: February 04, 2023   valACYclovir 500 MG tablet Commonly known as: VALTREX Take 500 mg by mouth 4 (four) times daily as needed (cold sores).   VITAMIN D-3 PO Take 1 capsule by mouth daily.  Follow-up Information     Benita Stabile, MD. Schedule an appointment as soon as possible for a visit in 3 day(s).   Specialty: Internal Medicine Why: Hospital Follow Up Contact information: [address] [place] [facility] [address] [phone]                No Known Allergies Allergies as of 02/03/2023   No Known Allergies      Medication List     STOP taking these medications    baclofen 20 MG tablet Commonly known as: LIORESAL   ibuprofen 200 MG tablet Commonly known as: ADVIL   methylPREDNISolone 4 MG Tbpk tablet Commonly known as: MEDROL DOSEPAK       TAKE these medications    apixaban 5 MG Tabs tablet Commonly known as: Eliquis Take 1 tablet (5 mg total) by mouth 2 (two) times daily.   diltiazem 180 MG 24 hr capsule Commonly known as: CARDIZEM CD Take 180 mg by mouth daily.   folic acid 1 MG tablet Commonly known as: FOLVITE Take 1 tablet (1 mg total) by mouth daily. Start taking on: February 04, 2023   lidocaine 5  % Commonly known as: LIDODERM Place 2 patches onto the skin daily. Remove & Discard patch within 12 hours or as directed by MD   LORazepam 1 MG tablet Commonly known as: ATIVAN Take 1 mg by mouth daily.   multivitamin tablet Take 1 tablet by mouth daily.   nebivolol 10 MG tablet Commonly known as: BYSTOLIC Take 10 mg by mouth daily.   oxyCODONE 5 MG immediate release tablet Commonly known as: Roxicodone Take 1 tablet (5 mg total) by mouth every 8 (eight) hours as needed for up to 4 days for severe pain.   pantoprazole 40 MG tablet Commonly known as: PROTONIX Take 40 mg by mouth daily.   sildenafil 100 MG tablet Commonly known as: Viagra Take 1 tablet (100 mg total) by mouth as needed for erectile dysfunction. What changed: when to take this   thiamine 100 MG tablet Commonly known as: Vitamin B-1 Take 1 tablet (100 mg total) by mouth daily. Start taking on: February 04, 2023   valACYclovir 500 MG tablet Commonly known as: VALTREX Take 500 mg by mouth 4 (four) times daily as needed (cold sores).   VITAMIN D-3 PO Take 1 capsule by mouth daily.        Procedures/Studies: [place] ELASTOGRAPHY LIVER  Result Date: 02/03/2023 CLINICAL DATA:  Cirrhosis EXAM: [place] ELASTOGRAPHY HEPATIC TECHNIQUE: Sonography of the liver was performed. In addition, ultrasound elastography evaluation of the liver was performed. A region of interest was placed within the right lobe of the liver. Following application of a compressive sonographic pulse, tissue compressibility was assessed. Multiple assessments were performed at the selected site. Median tissue compressibility was determined. Previously, hepatic stiffness was assessed by shear wave velocity. Based on recently published Society of Radiologists in Ultrasound consensus article, reporting is now recommended to be performed in the SI units of pressure (kiloPascals) representing hepatic stiffness/elasticity. The obtained result is compared to the  published reference standards. (cACLD = compensated Advanced Chronic Liver Disease) COMPARISON:  CT 2020.  Ultrasound of the 18 20 FINDINGS: Liver: Diffusely echogenic hepatic parenchyma consistent with fatty liver infiltration. With this level of echogenicity evaluation for underlying mass lesion is limited and if needed follow-up contrast CT or MRI as clinically appropriate. Portal vein is poorly seen today. ULTRASOUND HEPATIC ELASTOGRAPHY Device: Siemens Helix VTQ Patient position: Oblique Transducer: 9C2  Number of measurements: 12 Hepatic segment:  8 Median kPa: 7.2 IQR: 2.4 IQR/Median kPa ratio: 0.33 Data quality: IQR/Median kPa ratio of 0.3 or greater indicates reduced accuracy Diagnostic category: < or = 9 kPa: in the absence of other known clinical signs, rules out cACLD The use of hepatic elastography is applicable to patients with viral hepatitis and non-alcoholic fatty liver disease. At this time, there is insufficient data for the referenced cut-off values and use in other causes of liver disease, including alcoholic liver disease. Patients, however, may be assessed by elastography and serve as their own reference standard/baseline. In patients with non-alcoholic liver disease, the values suggesting compensated advanced chronic liver disease (cACLD) may be lower, and patients may need additional testing with elasticity results of 7-9 kPa. Please note that abnormal hepatic elasticity and shear wave velocities may also be identified in clinical settings other than with hepatic fibrosis, such as: acute hepatitis, elevated right heart and central venous pressures including use of beta blockers, veno-occlusive disease (Budd-Chiari), infiltrative processes such as mastocytosis/amyloidosis/infiltrative tumor/lymphoma, extrahepatic cholestasis, with hyperemia in the post-prandial state, and with liver transplantation. Correlation with patient history, laboratory data, and clinical condition recommended.  Diagnostic Categories: < or =5 kPa: high probability of being normal < or =9 kPa: in the absence of other known clinical signs, rules out cACLD >9 kPa and ?13 kPa: suggestive of cACLD, but needs further testing >13 kPa: highly suggestive of cACLD > or =17 kPa: highly suggestive of cACLD with an increased probability of clinically significant portal hypertension IMPRESSION: ULTRASOUND LIVER: Fatty liver infiltration. ULTRASOUND HEPATIC ELASTOGRAPHY: Median kPa:  7.2 Diagnostic category: < or = 9 kPa: in the absence of other known clinical signs, rules out cACLD Electronically Signed   By: Karen Kays M.D.   On: 02/03/2023 09:53   CT Lumbar Spine Wo Contrast  Result Date: 02/02/2023 CLINICAL DATA:  Back trauma. Concern for fracture versus spinal epidural hematoma. EXAM: CT LUMBAR SPINE WITHOUT CONTRAST TECHNIQUE: Multidetector CT imaging of the lumbar spine was performed without intravenous contrast administration. Multiplanar CT image reconstructions were also generated. RADIATION DOSE REDUCTION: This exam was performed according to the departmental dose-optimization program which includes automated exposure control, adjustment of the mA and/or kV according to patient size and/or use of iterative reconstruction technique. COMPARISON:  None Available. FINDINGS: Segmentation: Five lumbar type vertebrae Alignment: Mild degenerative anterolisthesis at L5-S1, facet mediated Vertebrae: L1 superior endplate fracture with mild-to-moderate depression, fracture margins appear subacute to chronic. Chronic T12 superior endplate fracture. No evidence of aggressive bone lesion. Paraspinal and other soft tissues: Negative Disc levels: Degenerative facet spurring greatest at L5-S1. Generalized disc height loss with spondylitic spurring. IMPRESSION: 1. L1 compression fracture favored subacute to chronic. 2. 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 Electronically Signed   By: Tiburcio Pea M.D.   On:  02/02/2023 12:50   CT Head Wo Contrast  Result Date: 02/02/2023 CLINICAL DATA:  Mental status change with unknown cause EXAM: CT HEAD WITHOUT CONTRAST TECHNIQUE: Contiguous axial images were obtained from the base of the skull through the vertex without intravenous contrast. RADIATION DOSE REDUCTION: This exam was performed according to the departmental dose-optimization program which includes automated exposure control, adjustment of the mA and/or kV according to patient size and/or use of iterative reconstruction technique. COMPARISON:  None Available. FINDINGS: Brain: No evidence of acute infarction, hemorrhage, hydrocephalus, extra-axial collection or mass lesion/mass effect. Cerebral volume loss to a mild degree for age. Vascular: No hyperdense vessel or unexpected calcification.  Skull: Normal. Negative for fracture or focal lesion. Sinuses/Orbits: No significant finding IMPRESSION: No acute finding, unremarkable for age. Electronically Signed   By: Tiburcio Pea M.D.   On: 02/02/2023 11:58     Subjective:   Discharge Exam: Vitals:   02/02/23 1948 02/03/23 0427  BP: 101/73 (!) 145/94  Pulse: 83 69  Resp: 18 17  Temp: 98.1 F (36.7 C) 98 F (36.7 C)  SpO2: 98% 96%   Vitals:   02/02/23 1111 02/02/23 1547 02/02/23 1948 02/03/23 0427  BP:  (!) 144/100 101/73 (!) 145/94  Pulse:  71 83 69  Resp:  17 18 17   Temp:  98.5 F (36.9 C) 98.1 F (36.7 C) 98 F (36.7 C)  TempSrc:      SpO2:  97% 98% 96%  Weight: 113.4 kg     Height: 6' (1.829 m)       General exam: Appears calm and comfortable  Respiratory system: Clear to auscultation. Respiratory effort normal. Cardiovascular system: normal S1 & S2 heard. No JVD, murmurs, rubs, gallops or clicks. No pedal edema. Gastrointestinal system: Abdomen is nondistended, soft and nontender. No organomegaly or masses felt. Normal bowel sounds heard. Central nervous system: Alert and oriented. No focal neurological deficits. Extremities:  Symmetric 5 x 5 power. Skin: No rashes, lesions or ulcers. Psychiatry: Judgement and insight appear normal. Mood & affect appropriate.    The results of significant diagnostics from this hospitalization (including imaging, microbiology, ancillary and laboratory) are listed below for reference.     Microbiology: No results found for this or any previous visit (from the past 240 hour(s)).   Labs: BNP (last 3 results) No results for input(s): "BNP" in the last 8760 hours. Basic Metabolic Panel: Recent Labs  Lab 02/02/23 1103 02/02/23 1501 02/03/23 0512  NA 128*  --  134*  K 3.6  --  3.8  CL 98  --  100  CO2 19*  --  24  GLUCOSE 117*  --  97  BUN 17  --  20  CREATININE 1.02  --  0.97  CALCIUM 9.3  --  8.6*  MG  --  1.9 2.2   Liver Function Tests: Recent Labs  Lab 02/02/23 1103 02/03/23 0512  AST 42* 53*  ALT 46* 53*  ALKPHOS 69 62  BILITOT 1.2 1.0  PROT 7.6 7.0  ALBUMIN 4.3 3.9   No results for input(s): "LIPASE", "AMYLASE" in the last 168 hours. Recent Labs  Lab 02/02/23 1501  AMMONIA 12   CBC: Recent Labs  Lab 02/02/23 1103 02/03/23 0512  WBC 11.8* 7.0  NEUTROABS 8.8* 3.7  HGB 16.6 16.1  HCT 44.9 45.9  MCV 93.3 97.2  PLT 189 170   Cardiac Enzymes: No results for input(s): "CKTOTAL", "CKMB", "CKMBINDEX", "TROPONINI" in the last 168 hours. BNP: Invalid input(s): "POCBNP" CBG: No results for input(s): "GLUCAP" in the last 168 hours. D-Dimer No results for input(s): "DDIMER" in the last 72 hours. Hgb A1c No results for input(s): "HGBA1C" in the last 72 hours. Lipid Profile No results for input(s): "CHOL", "HDL", "LDLCALC", "TRIG", "CHOLHDL", "LDLDIRECT" in the last 72 hours. Thyroid function studies No results for input(s): "TSH", "T4TOTAL", "T3FREE", "THYROIDAB" in the last 72 hours.  Invalid input(s): "FREET3" Anemia work up No results for input(s): "VITAMINB12", "FOLATE", "FERRITIN", "TIBC", "IRON", "RETICCTPCT" in the last 72  hours. Urinalysis    Component Value Date/Time   COLORURINE YELLOW 02/20/2013 2236   APPEARANCEUR Clear 09/24/2021 1537   LABSPEC 1.025 02/20/2013 2236  PHURINE 6.0 02/20/2013 2236   GLUCOSEU Negative 09/24/2021 1537   HGBUR MODERATE (A) 02/20/2013 2236   BILIRUBINUR Negative 09/24/2021 1537   KETONESUR TRACE (A) 02/20/2013 2236   PROTEINUR Negative 09/24/2021 1537   PROTEINUR NEGATIVE 02/20/2013 2236   UROBILINOGEN 0.2 02/20/2013 2236   NITRITE Negative 09/24/2021 1537   NITRITE NEGATIVE 02/20/2013 2236   LEUKOCYTESUR Negative 09/24/2021 1537   Sepsis Labs Recent Labs  Lab 02/02/23 1103 02/03/23 0512  WBC 11.8* 7.0   Microbiology No results found for this or any previous visit (from the past 240 hour(s)).  Time coordinating discharge:   SIGNED:  Standley Dakins, MD  [facility] 02/03/2023, 3:00 PM How to contact the [facility] Attending or Consulting provider 7A - 7P or covering provider during after hours 7P -7A, for this patient?  Check the care team in [facility] and look for a) attending/consulting [facility] provider listed and b) the [facility] team listed Log into [email or web address] and use [place]'s universal password to access. If you do not have the password, please contact the hospital operator. Locate the [facility] provider you are looking for under [facility] and page to a number that you can be directly reached. If you still have difficulty reaching the provider, please page the [facility] (Director on Call) for the Hospitalists listed on amion for assistance.

## 2023-02-09 NOTE — Progress Notes (Unsigned)
Referring Provider: Cleora Fleet, MD ([facility] hospitalist) Primary Care Physician:  Benita Stabile, MD Primary Gastroenterologist:  Dr. Marletta Lor  No chief complaint on file.   HPI:   Miguel Spencer is a 72 y.o. male presenting today at the request of Johnson, Clanford L, MD for fatty liver and elevated liver enzymes.  Patient was admitted 02/02/2023 - 02/03/2023 for delirium possibly secondary to alcohol and medications including Medrol Dosepak/baclofen due to recent fall sustaining a lumbar compression fracture 1 week prior.  Reported drinking 6-7 1 pint cans of beer on a daily basis.  Medications were held and mental status improved.  He was noted to have elevated liver enzymes, AST 42, ALT 46 on admission.  Ultrasound elastography of the liver was ordered while inpatient and suggested fatty infiltration, K PA 7.2.  Recommended outpatient GI follow-up.  Prior elastography in 2020 corresponding with manage your fibrosis score F3 + F4.     Today:     Last colonoscopy 03/05/2019 with two 4-10 mm polyps in the sigmoid colon removed, diverticulosis in the sigmoid colon, extensive a; ulceration and a skip pattern highly suspicious for ischemia.  Pathology with benign colorectal mucosa with associated fibrinopurulent material.  No evidence of IBD, dysplasia, or malignancy. Findings consistent with ischemic changes.  Polyp was hyperplastic.  Last EGD 02/26/2019: Grade D esophagitis, gastritis, nonbleeding duodenal ulcers.  Gastric biopsy with chronic inactive gastritis, no H. pylori.  Past Medical History:  Diagnosis Date   A-fib (HCC)    A-fib (HCC)    Alcohol abuse    Alcoholic (HCC)    Anxiety    Colon polyp    Depression    GI bleed    Gout    Hypertension    Vertigo     Past Surgical History:  Procedure Laterality Date   ADENOIDECTOMY     BIOPSY  02/26/2019   Procedure: BIOPSY;  Surgeon: West Bali, MD;  Location: [facility];  Service: Endoscopy;;  Gastric     BIOPSY  03/05/2019   Procedure: BIOPSY;  Surgeon: Corbin Ade, MD;  Location: [facility];  Service: Endoscopy;;  ascending colon bx   COLONOSCOPY  07/2018   [place] piecemeal fashion status post tattooing, 30 x 18 mm sized sigmoid colon polyp removed via snare status post tattooing.  Recommended repeat colonoscopy in 3 months.  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   COLONOSCOPY WITH PROPOFOL N/A 03/05/2019   Procedure: COLONOSCOPY WITH PROPOFOL;  Surgeon: Corbin Ade, MD;  Location: [facility];  Service: Endoscopy;  Laterality: N/A;   ESOPHAGOGASTRODUODENOSCOPY (EGD) WITH PROPOFOL N/A 02/26/2019   Dr. Darrick Penna: LA grade D esophagitis, gastritis with benign biopsies, no H. pylori, 5 nonbleeding superficial duodenal ulcers.  Felt to be NSAID related.   HERNIA REPAIR     inguinal-not sure which side   POLYPECTOMY  03/05/2019   Procedure: POLYPECTOMY;  Surgeon: Corbin Ade, MD;  Location: [facility];  Service: Endoscopy;;  cold snare polypectomy   TONSILLECTOMY     UMBILICAL HERNIA REPAIR N/A 07/06/2020   Procedure: UMBILICAL HERNIA REPAIR WITH MESH;  Surgeon: Lucretia Roers, MD;  Location: [facility];  Service: General;  Laterality: N/A;    Current Outpatient Medications  Medication Sig Dispense Refill   apixaban (ELIQUIS) 5 MG TABS tablet Take 1 tablet (5 mg total) by mouth 2 (two) times daily. 14  tablet 0   Cholecalciferol (VITAMIN D-3 PO) Take 1 capsule by mouth daily.     diltiazem (CARDIZEM CD) 180 MG 24 hr capsule Take 180 mg by mouth daily.     folic acid (FOLVITE) 1 MG tablet Take 1 tablet (1 mg total) by mouth daily. 30 tablet 1   lidocaine (LIDODERM) 5 % Place 2 patches onto the skin daily. Remove & Discard patch within 12 hours or as directed by MD 9 patch 0   LORazepam (ATIVAN) 1 MG tablet Take 1 mg by mouth daily.     Multiple Vitamin  (MULTIVITAMIN) tablet Take 1 tablet by mouth daily.     nebivolol (BYSTOLIC) 10 MG tablet Take 10 mg by mouth daily.     pantoprazole (PROTONIX) 40 MG tablet Take 40 mg by mouth daily.     sildenafil (VIAGRA) 100 MG tablet Take 1 tablet (100 mg total) by mouth as needed for erectile dysfunction. (Patient taking differently: Take 100 mg by mouth daily as needed for erectile dysfunction.) 20 tablet 5   thiamine (VITAMIN B-1) 100 MG tablet Take 1 tablet (100 mg total) by mouth daily. 30 tablet 1   valACYclovir (VALTREX) 500 MG tablet Take 500 mg by mouth 4 (four) times daily as needed (cold sores).     No current facility-administered medications for this visit.    Allergies as of 02/12/2023   (No Known Allergies)    Family History  Problem Relation Age of Onset   Cancer Mother    Heart disease Father    Heart disease Brother    Heart disease Other    Colon cancer Neg Hx    Gastric cancer Neg Hx    Esophageal cancer Neg Hx     Social History   Socioeconomic History   Marital status: Married    Spouse name: Not on file   Number of children: Not on file   Years of education: Not on file   Highest education level: Not on file  Occupational History   Not on file  Tobacco Use   Smoking status: Never   Smokeless tobacco: Never  Vaping Use   Vaping Use: Never used  Substance and Sexual Activity   Alcohol use: Yes    Alcohol/week: 70.0 standard drinks of alcohol    Types: 70 Cans of beer per week    Comment: 10 beers daily   Drug use: No   Sexual activity: Yes  Other Topics Concern   Not on file  Social History Narrative   Not on file   Social Determinants of Health   Financial Resource Strain: Not on file  Food Insecurity: No Food Insecurity (02/02/2023)   Hunger Vital Sign    Worried About Running Out of Food in the Last Year: Never true    Ran Out of Food in the Last Year: Never true  Transportation Needs: No Transportation Needs (02/02/2023)   PRAPARE -  Administrator, Civil Service (Medical): No    Lack of Transportation (Non-Medical): No  Physical Activity: Not on file  Stress: Not on file  Social Connections: Not on file  Intimate Partner Violence: Not At Risk (02/02/2023)   Humiliation, Afraid, Rape, and Kick questionnaire    Fear of Current or Ex-Partner: No    Emotionally Abused: No    Physically Abused: No    Sexually Abused: No    Review of Systems: Gen: Denies any fever, chills, fatigue, weight loss, lack of appetite.  CV: Denies  chest pain, heart palpitations, peripheral edema, syncope.  Resp: Denies shortness of breath at rest or with exertion. Denies wheezing or cough.  GI: Denies dysphagia or odynophagia. Denies jaundice, hematemesis, fecal incontinence. GU : Denies urinary burning, urinary frequency, urinary hesitancy MS: Denies joint pain, muscle weakness, cramps, or limitation of movement.  Derm: Denies rash, itching, dry skin Psych: Denies depression, anxiety, memory loss, and confusion Heme: Denies bruising, bleeding, and enlarged lymph nodes.  Physical Exam: There were no vitals taken for this visit. General:   Alert and oriented. Pleasant and cooperative. Well-nourished and well-developed.  Head:  Normocephalic and atraumatic. Eyes:  Without icterus, sclera clear and conjunctiva pink.  Ears:  Normal auditory acuity. Lungs:  Clear to auscultation bilaterally. No wheezes, rales, or rhonchi. No distress.  Heart:  S1, S2 present without murmurs appreciated.  Abdomen:  +BS, soft, non-tender and non-distended. No HSM noted. No guarding or rebound. No masses appreciated.  Rectal:  Deferred  Msk:  Symmetrical without gross deformities. Normal posture. Extremities:  Without edema. Neurologic:  Alert and  oriented x4;  grossly normal neurologically. Skin:  Intact without significant lesions or rashes. Psych:  Alert and cooperative. Normal mood and affect.    Assessment:     Plan:  ***   Ermalinda Memos, PA-C [facility] 02/12/2023

## 2023-02-12 ENCOUNTER — Encounter: Payer: Self-pay | Admitting: Gastroenterology

## 2023-02-12 ENCOUNTER — Ambulatory Visit (INDEPENDENT_AMBULATORY_CARE_PROVIDER_SITE_OTHER): Payer: Medicare HMO | Admitting: Gastroenterology

## 2023-02-12 VITALS — BP 103/66 | HR 67 | Temp 97.9°F | Ht 71.0 in | Wt 221.6 lb

## 2023-02-12 DIAGNOSIS — R7989 Other specified abnormal findings of blood chemistry: Secondary | ICD-10-CM | POA: Diagnosis not present

## 2023-02-12 NOTE — Patient Instructions (Addendum)
Please have blood work completed at [facility].  Continue to avoid all alcohol.  Avoid over-the-counter supplements and herbal teas.  If Tylenol is needed, limit this to no more than 2000 mg per 24 hours.  We will plan to see you back in about 3 months.  Do not hesitate to call if you have questions or concerns prior to your next visit.  It was nice to meet you today!  Ermalinda Memos, PA-C [facility]

## 2023-02-25 DIAGNOSIS — M4856XD Collapsed vertebra, not elsewhere classified, lumbar region, subsequent encounter for fracture with routine healing: Secondary | ICD-10-CM | POA: Diagnosis not present

## 2023-02-25 DIAGNOSIS — R410 Disorientation, unspecified: Secondary | ICD-10-CM | POA: Diagnosis not present

## 2023-02-25 DIAGNOSIS — I480 Paroxysmal atrial fibrillation: Secondary | ICD-10-CM | POA: Diagnosis not present

## 2023-02-25 DIAGNOSIS — K219 Gastro-esophageal reflux disease without esophagitis: Secondary | ICD-10-CM | POA: Diagnosis not present

## 2023-02-25 DIAGNOSIS — F10988 Alcohol use, unspecified with other alcohol-induced disorder: Secondary | ICD-10-CM | POA: Diagnosis not present

## 2023-02-25 DIAGNOSIS — K7460 Unspecified cirrhosis of liver: Secondary | ICD-10-CM | POA: Diagnosis not present

## 2023-02-25 DIAGNOSIS — Y92009 Unspecified place in unspecified non-institutional (private) residence as the place of occurrence of the external cause: Secondary | ICD-10-CM | POA: Diagnosis not present

## 2023-02-25 DIAGNOSIS — I10 Essential (primary) hypertension: Secondary | ICD-10-CM | POA: Diagnosis not present

## 2023-02-25 DIAGNOSIS — D509 Iron deficiency anemia, unspecified: Secondary | ICD-10-CM | POA: Diagnosis not present

## 2023-04-09 ENCOUNTER — Emergency Department (HOSPITAL_COMMUNITY)
Admission: EM | Admit: 2023-04-09 | Discharge: 2023-04-10 | Discharge status: Against Medical Advice | Payer: Medicare HMO | Attending: Emergency Medicine | Admitting: Emergency Medicine

## 2023-04-09 ENCOUNTER — Encounter (HOSPITAL_COMMUNITY): Payer: Self-pay

## 2023-04-09 ENCOUNTER — Emergency Department (HOSPITAL_COMMUNITY): Payer: Medicare HMO

## 2023-04-09 ENCOUNTER — Other Ambulatory Visit: Payer: Self-pay

## 2023-04-09 DIAGNOSIS — I10 Essential (primary) hypertension: Secondary | ICD-10-CM | POA: Diagnosis not present

## 2023-04-09 DIAGNOSIS — Z7901 Long term (current) use of anticoagulants: Secondary | ICD-10-CM | POA: Diagnosis not present

## 2023-04-09 DIAGNOSIS — R55 Syncope and collapse: Secondary | ICD-10-CM | POA: Insufficient documentation

## 2023-04-09 DIAGNOSIS — Y905 Blood alcohol level of 100-119 mg/100 ml: Secondary | ICD-10-CM | POA: Diagnosis not present

## 2023-04-09 DIAGNOSIS — I4891 Unspecified atrial fibrillation: Secondary | ICD-10-CM | POA: Diagnosis not present

## 2023-04-09 DIAGNOSIS — Z79899 Other long term (current) drug therapy: Secondary | ICD-10-CM | POA: Insufficient documentation

## 2023-04-09 DIAGNOSIS — H81399 Other peripheral vertigo, unspecified ear: Secondary | ICD-10-CM | POA: Diagnosis not present

## 2023-04-09 DIAGNOSIS — Z5329 Procedure and treatment not carried out because of patient's decision for other reasons: Secondary | ICD-10-CM | POA: Diagnosis not present

## 2023-04-09 DIAGNOSIS — F1010 Alcohol abuse, uncomplicated: Secondary | ICD-10-CM | POA: Insufficient documentation

## 2023-04-09 DIAGNOSIS — R42 Dizziness and giddiness: Secondary | ICD-10-CM | POA: Diagnosis present

## 2023-04-09 DIAGNOSIS — R0902 Hypoxemia: Secondary | ICD-10-CM | POA: Diagnosis not present

## 2023-04-09 DIAGNOSIS — I959 Hypotension, unspecified: Secondary | ICD-10-CM | POA: Diagnosis not present

## 2023-04-09 LAB — URINALYSIS, ROUTINE W REFLEX MICROSCOPIC
Bilirubin Urine: NEGATIVE
Glucose, UA: NEGATIVE mg/dL
Hgb urine dipstick: NEGATIVE
Ketones, ur: NEGATIVE mg/dL
Leukocytes,Ua: NEGATIVE
Nitrite: NEGATIVE
Protein, ur: NEGATIVE mg/dL
Specific Gravity, Urine: 1.009 (ref 1.005–1.030)
pH: 6 (ref 5.0–8.0)

## 2023-04-09 LAB — CBC
HCT: 33.8 % — ABNORMAL LOW (ref 39.0–52.0)
Hemoglobin: 12.2 g/dL — ABNORMAL LOW (ref 13.0–17.0)
MCH: 33.3 pg (ref 26.0–34.0)
MCHC: 36.1 g/dL — ABNORMAL HIGH (ref 30.0–36.0)
MCV: 92.3 fL (ref 80.0–100.0)
Platelets: 166 10*3/uL (ref 150–400)
RBC: 3.66 MIL/uL — ABNORMAL LOW (ref 4.22–5.81)
RDW: 12.7 % (ref 11.5–15.5)
WBC: 6.8 10*3/uL (ref 4.0–10.5)
nRBC: 0 % (ref 0.0–0.2)

## 2023-04-09 LAB — ETHANOL: Alcohol, Ethyl (B): 104 mg/dL — ABNORMAL HIGH (ref <10)

## 2023-04-09 LAB — BASIC METABOLIC PANEL
Anion gap: 11 (ref 5–15)
BUN: 11 mg/dL (ref 8–23)
CO2: 17 mmol/L — ABNORMAL LOW (ref 22–32)
Calcium: 7.8 mg/dL — ABNORMAL LOW (ref 8.9–10.3)
Chloride: 99 mmol/L (ref 98–111)
Creatinine, Ser: 1.18 mg/dL (ref 0.61–1.24)
GFR, Estimated: >60 mL/min (ref >60)
Glucose, Bld: 100 mg/dL — ABNORMAL HIGH (ref 70–99)
Potassium: 3.4 mmol/L — ABNORMAL LOW (ref 3.5–5.1)
Sodium: 127 mmol/L — ABNORMAL LOW (ref 135–145)

## 2023-04-09 LAB — CBG MONITORING, ED: Glucose-Capillary: 116 mg/dL — ABNORMAL HIGH (ref 70–99)

## 2023-04-09 LAB — TROPONIN I (HIGH SENSITIVITY): Troponin I (High Sensitivity): 3 ng/L (ref <18)

## 2023-04-09 MED ORDER — SODIUM CHLORIDE 0.9 % IV SOLN: INTRAVENOUS | Status: DC

## 2023-04-09 MED ORDER — SODIUM CHLORIDE 0.9 % IV BOLUS
1000.0000 mL | Freq: Once | INTRAVENOUS | Status: AC
  Administered 2023-04-09: 1000 mL via INTRAVENOUS

## 2023-04-09 NOTE — ED Provider Notes (Addendum)
[facility] EMERGENCY DEPARTMENT AT [facility] Provider Note   CSN: [id number] Arrival date & time: 04/09/23  2051     History  Chief Complaint  Patient presents with   Loss of Consciousness    Miguel Spencer is a 72 y.o. male.  Patient brought in by EMS.  Patient endorses alcohol consumption.  Brought in for EMS for syncope or near syncope.  Patient states he was feeling lightheaded the vehicle and he collapsed his wife witnessed this.  He did not necessarily go completely out there was no seizure-like activity.  Patient also was admitted in April for delirium and a similar fall where he cannot fractured his back.  They make comment on that note that patient is a functional alcoholic.  Patient is on Eliquis for atrial fibs.  And is also on cardia exam.  Patient denies any room spinning.  Past medical history is in for hypertension vertigo alcohol abuse A-fib.  Patient denies any nausea vomiting or diarrhea.  Patient admits to still drinking.  But his wife thinks that he is not currently.  Patient denies any neck pain any headache denies any visual changes or weakness to upper extremities or lower extremities.  No abdominal pain.  No chest pain no shortness of breath.  Blood pressure was soft on arrival.  EMS had given him a 500 cc bolus and then we gave him a liter of fluid here.  We will now give him maintenance fluids.       Home Medications Prior to Admission medications   Medication Sig Start Date End Date Taking? Authorizing Provider  apixaban (ELIQUIS) 5 MG TABS tablet Take 1 tablet (5 mg total) by mouth 2 (two) times daily. 04/05/19   Antoine Poche, MD  Cholecalciferol (VITAMIN D-3 PO) Take 1 capsule by mouth daily.    [provider]  diltiazem (CARDIZEM CD) 180 MG 24 hr capsule Take 180 mg by mouth daily.    [provider]  folic acid (FOLVITE) 1 MG tablet Take 1 tablet (1 mg total) by mouth daily. 02/04/23   Johnson, Clanford L, MD  lidocaine  (LIDODERM) 5 % Place 2 patches onto the skin daily. Remove & Discard patch within 12 hours or as directed by MD 02/03/23   Cleora Fleet, MD  LORazepam (ATIVAN) 1 MG tablet Take 1 mg by mouth daily. 01/07/20   [provider]  Multiple Vitamin (MULTIVITAMIN) tablet Take 1 tablet by mouth daily.    [provider]  nebivolol (BYSTOLIC) 10 MG tablet Take 10 mg by mouth daily. 03/07/22   [provider]  pantoprazole (PROTONIX) 40 MG tablet Take 40 mg by mouth daily.    [provider]  sildenafil (VIAGRA) 100 MG tablet Take 1 tablet (100 mg total) by mouth as needed for erectile dysfunction. Patient taking differently: Take 100 mg by mouth daily as needed for erectile dysfunction. 07/11/22 07/11/23  Jerilee Field, MD  thiamine (VITAMIN B-1) 100 MG tablet Take 1 tablet (100 mg total) by mouth daily. Patient not taking: Reported on 02/12/2023 02/04/23   Cleora Fleet, MD  valACYclovir (VALTREX) 500 MG tablet Take 500 mg by mouth 4 (four) times daily as needed (cold sores).    [provider]      Allergies    Patient has no known allergies.    Review of Systems   Review of Systems  Constitutional:  Negative for chills and fever.  HENT:  Negative for ear pain  and sore throat.   Eyes:  Negative for pain and visual disturbance.  Respiratory:  Negative for cough and shortness of breath.   Cardiovascular:  Negative for chest pain and palpitations.  Gastrointestinal:  Negative for abdominal pain and vomiting.  Genitourinary:  Negative for dysuria and hematuria.  Musculoskeletal:  Negative for arthralgias and back pain.  Skin:  Negative for color change and rash.  Neurological:  Positive for syncope and light-headedness. Negative for seizures.  All other systems reviewed and are negative.   Physical Exam Updated Vital Signs BP 93/71   Pulse 77   Temp 97.9 F (36.6 C) (Oral)   Resp 19   Ht 1.803 m (5\' 11" )   Wt 100.5 kg   SpO2 93%   BMI  30.90 kg/m  Physical Exam Vitals and nursing note reviewed.  Constitutional:      General: He is not in acute distress.    Appearance: Normal appearance. He is well-developed.  HENT:     Head: Normocephalic and atraumatic.     Mouth/Throat:     Mouth: Mucous membranes are dry.  Eyes:     Conjunctiva/sclera: Conjunctivae normal.     Pupils: Pupils are equal, round, and reactive to light.  Cardiovascular:     Rate and Rhythm: Normal rate and regular rhythm.     Heart sounds: No murmur heard. Pulmonary:     Effort: Pulmonary effort is normal. No respiratory distress.     Breath sounds: Normal breath sounds. No wheezing, rhonchi or rales.  Chest:     Chest wall: No tenderness.  Abdominal:     Palpations: Abdomen is soft.     Tenderness: There is no abdominal tenderness.  Musculoskeletal:        General: No swelling.     Cervical back: Normal range of motion and neck supple.  Skin:    General: Skin is warm and dry.     Capillary Refill: Capillary refill takes less than 2 seconds.  Neurological:     General: No focal deficit present.     Mental Status: He is alert and oriented to person, place, and time.  Psychiatric:        Mood and Affect: Mood normal.     ED Results / Procedures / Treatments   Labs (all labs ordered are listed, but only abnormal results are displayed) Labs Reviewed  BASIC METABOLIC PANEL - Abnormal; Notable for the following components:      Result Value   Sodium 127 (*)    Potassium 3.4 (*)    CO2 17 (*)    Glucose, Bld 100 (*)    Calcium 7.8 (*)    All other components within normal limits  CBC - Abnormal; Notable for the following components:   RBC 3.66 (*)    Hemoglobin 12.2 (*)    HCT 33.8 (*)    MCHC 36.1 (*)    All other components within normal limits  ETHANOL - Abnormal; Notable for the following components:   Alcohol, Ethyl (B) 104 (*)    All other components within normal limits  CBG MONITORING, ED - Abnormal; Notable for the  following components:   Glucose-Capillary 116 (*)    All other components within normal limits  URINALYSIS, ROUTINE W REFLEX MICROSCOPIC  HEPATIC FUNCTION PANEL  TROPONIN I (HIGH SENSITIVITY)  TROPONIN I (HIGH SENSITIVITY)    EKG EKG Interpretation Date/Time:  Wednesday April 09 2023 21:05:18 EDT Ventricular Rate:  63 PR Interval:    QRS  Duration:  106 QT Interval:  448 QTC Calculation: 459 R Axis:   9  Text Interpretation: Atrial fibrillation RSR' in V1 or V2, right VCD or RVH No significant change since last tracing Confirmed by Vanetta Mulders [phone]) on 04/09/2023 11:20:39 PM  Radiology No results found.  Procedures Procedures    Medications Ordered in ED Medications  0.9 %  sodium chloride infusion ( Intravenous New Bag/Given 04/09/23 2335)  sodium chloride 0.9 % bolus 1,000 mL (0 mLs Intravenous Stopped 04/09/23 2149)    ED Course/ Medical Decision Making/ A&P                             Medical Decision Making Amount and/or Complexity of Data Reviewed Labs: ordered. Radiology: ordered.  Risk Prescription drug management.   Patient here we did orthostatic vital signs.  And patient's blood pressure actually is around 1 10-1 15.  Very reassuring.  Patient also was able to stand for 3 minutes.  Felt a little lightheaded but no real dizziness or room spinning.  Did not feel like he was in a fall over.  Actually said the lightheadedness was a little less with standing that was laying in bed.  Urinalysis negative blood sugar was 116 basic metabolic panel significant for sodium of 127 has been low in the past CO2 17 glucose 100.  Creatinine was 1.18 white count 6.8 hemoglobin 12.2 platelets are normal at 166.  Initial troponin was 3 delta troponin is pending.  Alcohol is pending hepatic panel is pending.  CT head of chest is pending.  Think that if patient's head CT and delta troponin and other labs showed no significant abnormalities he can probably be discharged home.  He  is pretty functional and seems to be improved significantly with the fluids.   Final Clinical Impression(s) / ED Diagnoses Final diagnoses:  Vasovagal syncope  Lightheadedness    Rx / DC Orders ED Discharge Orders     None         Vanetta Mulders, MD 04/09/23 2357    Vanetta Mulders, MD 04/09/23 [phone]

## 2023-04-09 NOTE — ED Notes (Signed)
Patient transported to CT 

## 2023-04-09 NOTE — ED Notes (Signed)
Dr. Zackowski at bedside  

## 2023-04-09 NOTE — ED Triage Notes (Signed)
Pt arrives from home via EMS for syncopal episode. Pt endorses ETOH consumption, EMS reports pt was getting out of vehicle at home when syncope occurred. Pt on Eliquis, denies pain, pt says he had fall 6 weeks ago as well and dx with lumbar fx. Pt had 220 ml NS in route by EMS

## 2023-04-10 ENCOUNTER — Emergency Department (HOSPITAL_COMMUNITY): Payer: Medicare HMO

## 2023-04-10 LAB — HEPATIC FUNCTION PANEL
ALT: 27 U/L (ref 0–44)
AST: 20 U/L (ref 15–41)
Albumin: 3.8 g/dL (ref 3.5–5.0)
Alkaline Phosphatase: 60 U/L (ref 38–126)
Bilirubin, Direct: 0.1 mg/dL (ref 0.0–0.2)
Indirect Bilirubin: 0.5 mg/dL (ref 0.3–0.9)
Total Bilirubin: 0.6 mg/dL (ref 0.3–1.2)
Total Protein: 6.6 g/dL (ref 6.5–8.1)

## 2023-04-10 LAB — TROPONIN I (HIGH SENSITIVITY): Troponin I (High Sensitivity): 4 ng/L (ref <18)

## 2023-04-10 MED ORDER — MECLIZINE HCL 12.5 MG PO TABS
25.0000 mg | ORAL_TABLET | Freq: Once | ORAL | Status: AC
  Administered 2023-04-10: 25 mg via ORAL
  Filled 2023-04-10: qty 2

## 2023-04-10 MED ORDER — MECLIZINE HCL 25 MG PO TABS: 25.0000 mg | ORAL_TABLET | Freq: Three times a day (TID) | ORAL | 0 refills | Status: DC | PRN

## 2023-04-10 NOTE — ED Notes (Signed)
Pt appears to be comfortable and resting, can observe even RR that are unlabored, intermittent snoring can be heard, pt remains on cardiac monitoring devices, no changes noted, side rails up x2 for safety, NAD noted, plan of care ongoing, call light within reach, no further concerns as of present.

## 2023-04-10 NOTE — ED Provider Notes (Signed)
Care assumed at the change of shift. Patient here for dizziness, ?syncope, awaiting labs and CT head.  Physical Exam  BP 108/74   Pulse 90   Temp 97.8 F (36.6 C) (Oral)   Resp 16   Ht 5\' 11"  (1.803 m)   Wt 100.5 kg   SpO2 92%   BMI 30.90 kg/m   Physical Exam  Procedures  Procedures  ED Course / MDM   Clinical Course as of 04/10/23 0238  Thu Apr 10, 2023  0235 Patient's repeat trop remains normal. Attempted CT but he reports room spinning dizziness when lying flat. He is asymptomatic when sitting up. He has had vertigo in the past that was similar. He was given a dose of meclizine and tried CT again however he was not able to tolerate lying flat. I offered the patient additional vertigo treatments and/or admission but he states he wants to go home. He understands that without CT we cannot rule out a central cause of his symptoms but he still wants to go home. Plan Rx for meclizine. He has a recliner he can sleep in. PCP follow up. RTED if his symptoms are not improving.  [CS]    Clinical Course User Index [CS] Pollyann Savoy, MD   Medical Decision Making Problems Addressed: Peripheral positional vertigo, unspecified laterality: acute illness or injury  Amount and/or Complexity of Data Reviewed Labs: ordered. Decision-making details documented in ED Course. Radiology: ordered and independent interpretation performed. Decision-making details documented in ED Course.  Risk Prescription drug management.         Pollyann Savoy, MD 04/10/23 [phone]

## 2023-04-10 NOTE — ED Notes (Signed)
Pt reports he would like to go home, st he is unable to lay flat for CT scan d/t dizziness, reports he has been admitted in the past for dizziness, report hx of vertigo. Pt refuses to be admitted at this time, would like to be d/c home. Up until CT scanner, pt has been a sleep, resting without distress. Pt has been been able to lay left lateral, eyes closed and HOB at 30 degree angle w/o reports of dizziness or any complaints. Reported he is comfortable and feels better with HOB at angle and when standing. Dr. Bernette Mayers notified of pt's decision to be d/c back home

## 2023-04-10 NOTE — ED Notes (Signed)
Patient transported to CT 

## 2023-04-15 DIAGNOSIS — R7301 Impaired fasting glucose: Secondary | ICD-10-CM | POA: Diagnosis not present

## 2023-04-15 DIAGNOSIS — E782 Mixed hyperlipidemia: Secondary | ICD-10-CM | POA: Diagnosis not present

## 2023-04-15 DIAGNOSIS — K7460 Unspecified cirrhosis of liver: Secondary | ICD-10-CM | POA: Diagnosis not present

## 2023-04-15 DIAGNOSIS — Z125 Encounter for screening for malignant neoplasm of prostate: Secondary | ICD-10-CM | POA: Diagnosis not present

## 2023-04-16 DIAGNOSIS — R42 Dizziness and giddiness: Secondary | ICD-10-CM | POA: Diagnosis not present

## 2023-04-16 DIAGNOSIS — I10 Essential (primary) hypertension: Secondary | ICD-10-CM | POA: Diagnosis not present

## 2023-04-16 DIAGNOSIS — R7303 Prediabetes: Secondary | ICD-10-CM | POA: Diagnosis not present

## 2023-04-16 DIAGNOSIS — F1010 Alcohol abuse, uncomplicated: Secondary | ICD-10-CM | POA: Diagnosis not present

## 2023-04-16 DIAGNOSIS — Z23 Encounter for immunization: Secondary | ICD-10-CM | POA: Diagnosis not present

## 2023-04-16 DIAGNOSIS — R410 Disorientation, unspecified: Secondary | ICD-10-CM | POA: Diagnosis not present

## 2023-04-16 DIAGNOSIS — Y92009 Unspecified place in unspecified non-institutional (private) residence as the place of occurrence of the external cause: Secondary | ICD-10-CM | POA: Diagnosis not present

## 2023-04-16 DIAGNOSIS — E782 Mixed hyperlipidemia: Secondary | ICD-10-CM | POA: Diagnosis not present

## 2023-04-16 DIAGNOSIS — M4856XD Collapsed vertebra, not elsewhere classified, lumbar region, subsequent encounter for fracture with routine healing: Secondary | ICD-10-CM | POA: Diagnosis not present

## 2023-04-17 ENCOUNTER — Ambulatory Visit: Payer: Medicare HMO | Attending: Cardiology | Admitting: Cardiology

## 2023-04-17 ENCOUNTER — Encounter: Payer: Self-pay | Admitting: Internal Medicine

## 2023-04-17 ENCOUNTER — Encounter: Payer: Self-pay | Admitting: Cardiology

## 2023-04-17 VITALS — BP 126/78 | HR 68 | Ht 72.0 in | Wt 226.0 lb

## 2023-04-17 DIAGNOSIS — I10 Essential (primary) hypertension: Secondary | ICD-10-CM

## 2023-04-17 DIAGNOSIS — R55 Syncope and collapse: Secondary | ICD-10-CM

## 2023-04-17 DIAGNOSIS — I480 Paroxysmal atrial fibrillation: Secondary | ICD-10-CM | POA: Diagnosis not present

## 2023-04-17 DIAGNOSIS — I340 Nonrheumatic mitral (valve) insufficiency: Secondary | ICD-10-CM

## 2023-04-17 NOTE — Progress Notes (Signed)
Clinical Summary Mr. Vanaman is a 72 y.o.male seen today for follow up of the following medical problems.    1. PAF - admit with afib 08/2018 after not being able to take meds at home after being arrested - started on dilt gtt for afib with RVR. Restarted on his home toprol and dilt   - previously followed in [place], diagnosed with afib around 2017 - Dr Bunnie Pion [place], [place]. - issues with insurance with eliquis and xarelto. Had been on coumadin but stopped at time of his GI bleed  09/2018 Holter: PACs, rare runs of atach up to 3 beats. No symptoms reported     -reports changed from lopressor to bystolic due to erectile dysfunction and doing better   - no recent palpitations - compliant with meds. No bleeding on eliquis.      2. EtOH abuse/Cirrhosis - followed by GI     3. History of GI bleeding - admission to [facility] in [place], [place] with hematemsis and rectal bleeding 07/2018 while on coumadin - EGD and colonopscopy showed gastritis, polyp  - admitted 02/2019 with rectal bleeding while on eliquis and indomethacin for gout - 02/2019 colonoscopy concerning for ischemic colitis however CTA was benign. EGD diffuse mild inflammation, duodenal ulcers without bleeding. was to resume eliquis 03/16/19   - no recent issues.    4. Mitral regurgitation - mild to mod by 2019 echo -04/2022 echo: LVEF 55-60%, no WMAs, mild MR   5. HTN - side effects on chlorthaldone, tried for 3 months. Causes some dizziness, felt "out of sorts" - compliant with meds   6. Erectile dysfunction - not responding to cialis or viagra.  - he is seen by a urologist.  - uncler if could be a medication side effect, perhaps beta blocker   7.Syncope -ER visit 04/2023 -  benign labs, EKG rate controlled afib. He turned down CT head - was coming back from lunch. Went to get out of car, after standing fell to ground. EMS was called and brought to hospital/ +EtOH level -  water 4-5 bottles a day in general    SH: just got married few months ago Past Medical History:  Diagnosis Date   A-fib (HCC)    Alcohol abuse    Alcoholic (HCC)    Anxiety    Colon polyp    Depression    GERD (gastroesophageal reflux disease)    GI bleed    Gout    Hypertension    Vertigo      No Known Allergies   Current Outpatient Medications  Medication Sig Dispense Refill   apixaban (ELIQUIS) 5 MG TABS tablet Take 1 tablet (5 mg total) by mouth 2 (two) times daily. 14 tablet 0   Cholecalciferol (VITAMIN D-3 PO) Take 1 capsule by mouth daily.     diltiazem (CARDIZEM CD) 180 MG 24 hr capsule Take 180 mg by mouth daily.     folic acid (FOLVITE) 1 MG tablet Take 1 tablet (1 mg total) by mouth daily. 30 tablet 1   lidocaine (LIDODERM) 5 % Place 2 patches onto the skin daily. Remove & Discard patch within 12 hours or as directed by MD 9 patch 0   LORazepam (ATIVAN) 1 MG tablet Take 1 mg by mouth daily.     meclizine (ANTIVERT) 25 MG tablet Take 1 tablet (25 mg total) by mouth 3 (three) times daily as needed for dizziness. 30 tablet 0   Multiple Vitamin (  MULTIVITAMIN) tablet Take 1 tablet by mouth daily.     nebivolol (BYSTOLIC) 10 MG tablet Take 10 mg by mouth daily.     pantoprazole (PROTONIX) 40 MG tablet Take 40 mg by mouth daily.     sildenafil (VIAGRA) 100 MG tablet Take 1 tablet (100 mg total) by mouth as needed for erectile dysfunction. (Patient taking differently: Take 100 mg by mouth daily as needed for erectile dysfunction.) 20 tablet 5   thiamine (VITAMIN B-1) 100 MG tablet Take 1 tablet (100 mg total) by mouth daily. (Patient not taking: Reported on 02/12/2023) 30 tablet 1   valACYclovir (VALTREX) 500 MG tablet Take 500 mg by mouth 4 (four) times daily as needed (cold sores).     No current facility-administered medications for this visit.     Past Surgical History:  Procedure Laterality Date   ADENOIDECTOMY     BIOPSY  02/26/2019   Procedure: BIOPSY;   Surgeon: West Bali, MD;  [facility];  Service: Endoscopy;;  Gastric    BIOPSY  03/05/2019   Procedure: BIOPSY;  Surgeon: Corbin Ade, MD;  [facility];  Service: Endoscopy;;  ascending colon bx   COLONOSCOPY  07/2018   [place] piecemeal fashion status post tattooing, 30 x 18 mm sized sigmoid colon polyp removed via snare status post tattooing.  Recommended repeat colonoscopy in 3 months.  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   COLONOSCOPY WITH PROPOFOL N/A 03/05/2019   Procedure: COLONOSCOPY WITH PROPOFOL;  Surgeon: Corbin Ade, MD;  [facility];  Service: Endoscopy;  Laterality: N/A;   ESOPHAGOGASTRODUODENOSCOPY (EGD) WITH PROPOFOL N/A 02/26/2019   Dr. Darrick Penna: LA grade D esophagitis, gastritis with benign biopsies, no H. pylori, 5 nonbleeding superficial duodenal ulcers.  Felt to be NSAID related.   HERNIA REPAIR     inguinal-not sure which side   POLYPECTOMY  03/05/2019   Procedure: POLYPECTOMY;  Surgeon: Corbin Ade, MD;  [facility];  Service: Endoscopy;;  cold snare polypectomy   TONSILLECTOMY     UMBILICAL HERNIA REPAIR N/A 07/06/2020   Procedure: UMBILICAL HERNIA REPAIR WITH MESH;  Surgeon: Lucretia Roers, MD;  Location: [facility];  Service: General;  Laterality: N/A;     No Known Allergies    Family History  Problem Relation Age of Onset   Cancer Mother    Heart disease Father    Heart disease Brother    Heart disease Other    Colon cancer Neg Hx    Gastric cancer Neg Hx    Esophageal cancer Neg Hx      Social History Mr. Maniscalco reports that he has never smoked. He has never used smokeless tobacco. Mr. Markus reports that he does not currently use alcohol after a past usage of about 70.0 standard drinks of alcohol per week.   Review of Systems CONSTITUTIONAL: No weight loss, fever,  chills, weakness or fatigue.  HEENT: Eyes: No visual loss, blurred vision, double vision or yellow sclerae.No hearing loss, sneezing, congestion, runny nose or sore throat.  SKIN: No rash or itching.  CARDIOVASCULAR: per hpi RESPIRATORY: No shortness of breath, cough or sputum.  GASTROINTESTINAL: No anorexia, nausea, vomiting or diarrhea. No abdominal pain or blood.  GENITOURINARY: No burning on urination, no polyuria NEUROLOGICAL: No headache, dizziness, syncope, paralysis, ataxia, numbness or tingling in the extremities. No change in bowel or bladder  control.  MUSCULOSKELETAL: No muscle, back pain, joint pain or stiffness.  LYMPHATICS: No enlarged nodes. No history of splenectomy.  PSYCHIATRIC: No history of depression or anxiety.  ENDOCRINOLOGIC: No reports of sweating, cold or heat intolerance. No polyuria or polydipsia.  Marland Kitchen   Physical Examination Today's Vitals   04/17/23 1411  BP: 126/78  Pulse: 68  SpO2: 97%  Weight: 226 lb (102.5 kg)  Height: 6' (1.829 m)   Body mass index is 30.65 kg/m.  Gen: resting comfortably, no acute distress HEENT: no scleral icterus, pupils equal round and reactive, no palptable cervical adenopathy,  CV: RRR, no mrg, no jvd Resp: Clear to auscultation bilaterally GI: abdomen is soft, non-tender, non-distended, normal bowel sounds, no hepatosplenomegaly MSK: extremities are warm, no edema.  Skin: warm, no rash Neuro:  no focal deficits Psych: appropriate affect   Diagnostic Studies  08/2018 echo Study Conclusions   - Left ventricle: The cavity size was normal. Wall thickness was   increased in a pattern of mild LVH. Systolic function was normal.   The estimated ejection fraction was 50%. Diffuse hypokinesis. The   study was not technically sufficient to allow evaluation of LV   diastolic dysfunction due to atrial fibrillation. - Aortic valve: Mildly calcified annulus. Trileaflet; mildly   calcified leaflets. - Aortic root: The aortic  root was mildly dilated. - Mitral valve: Mildly calcified annulus. There was mild to   moderate regurgitation. - Left atrium: The atrium was moderately dilated. - Right atrium: Central venous pressure (est): 15 mm Hg. - Atrial septum: No defect or patent foramen ovale was identified. - Tricuspid valve: There was mild regurgitation. - Pulmonary arteries: PA peak pressure: 46 mm Hg (S). - Pericardium, extracardiac: A trivial pericardial effusion was   identified posterior to the heart.     09/2018 Holter: PACs, rare runs of atach up to 3 beats. No symptoms  04/2022 echo 1. Left ventricular ejection fraction, by estimation, is 55 to 60%. The  left ventricle has normal function. The left ventricle has no regional  wall motion abnormalities. There is mild left ventricular hypertrophy.  Left ventricular diastolic parameters  are indeterminate.   2. Right ventricular systolic function is normal. The right ventricular  size is normal. There is normal pulmonary artery systolic pressure.   3. Left atrial size was severely dilated.   4. Right atrial size was mildly dilated.   5. The mitral valve is normal in structure. Mild mitral valve  regurgitation. No evidence of mitral stenosis.   6. The tricuspid valve is abnormal.   7. The aortic valve has an indeterminant number of cusps. There is mild  calcification of the aortic valve. There is mild thickening of the aortic  valve. Aortic valve regurgitation is not visualized. very mild aortic  stenosis. Aortic valve mean gradient  measures 9.0 mmHg. Aortic valve peak gradient measures 16.0 mmHg. Aortic  valve area, by VTI measures 1.76 cm.   8. Aortic dilatation noted. There is mild dilatation of the aortic root,  measuring 40 mm.   9. The inferior vena cava is normal in size with greater than 50%  respiratory variability, suggesting right atrial pressure of 3 mmHg.    Assessment and Plan   1. PAF/acquired thrombophilia - no symptoms,  continue current meds including eliquis for stroke prevention   2. Mitral regurgitation -mild by recent US, continue to monitor   3. HTN -side effects on cholrthalidone, he stopped taking - bp's look fine on bystolic and diltiazem  which he is also on for afib - we will continue current med  4.Syncope - by history sounds like orthostatic syncope in setting of EtoH use. Prior history of alcoholism. Discussed refraining from EtOH, aggressive regular hydration. - no further workup at this time.        Antoine Poche, M.D.

## 2023-04-17 NOTE — Patient Instructions (Signed)
Medication Instructions:  Your physician recommends that you continue on your current medications as directed. Please refer to the Current Medication list given to you today.  *If you need a refill on your cardiac medications before your next appointment, please call your pharmacy*   Lab Work: None If you have labs (blood work) drawn today and your tests are completely normal, you will receive your results only by: MyChart Message (if you have MyChart) OR A paper copy in the mail If you have any lab test that is abnormal or we need to change your treatment, we will call you to review the results.   Testing/Procedures: None   Follow-Up: At [facility], you and your health needs are our priority.  As part of our continuing mission to provide you with exceptional heart care, we have created designated Provider Care Teams.  These Care Teams include your primary Cardiologist (physician) and Advanced Practice Providers (APPs -  Physician Assistants and Nurse Practitioners) who all work together to provide you with the care you need, when you need it.  We recommend signing up for the patient portal called "MyChart".  Sign up information is provided on this After Visit Summary.  MyChart is used to connect with patients for Virtual Visits (Telemedicine).  Patients are able to view lab/test results, encounter notes, upcoming appointments, etc.  Non-urgent messages can be sent to your provider as well.   To learn more about what you can do with MyChart, go to [email or web address].    Your next appointment:   6 month(s)  Provider:   You may see Branch, Jonathan, MD or one of the following Advanced Practice Providers on your designated Care Team:   Brittany Strader, PA-C  Michele Lenze, PA-C     Other Instructions    

## 2023-05-11 NOTE — Progress Notes (Signed)
Referring Provider: Benita Stabile, MD Primary Care Physician:  Benita Stabile, MD Primary GI Physician: Dr. Marletta Lor  No chief complaint on file.   HPI:   Miguel Spencer is a 72 y.o. male with history of atrial fibrillation on Eliquis, alcohol abuse, HTN, GERD, duodenal ulcer in 2020, presenting today for follow-up of elevated LFTs.  Last seen in the office 02/12/2023.  He had been recently been found to have elevated AST and ALT, max 53, 53 respectively on 02/03/2023. Ultrasound elastography with fatty liver, K PA 7.2. He had no signs or symptoms of decompensated liver disease. Platelets were normal. Suspect LFT elevation is secondary to fatty liver in the setting of chronic alcohol use/abuse. He had been drinking 7-8 pints of beer per day. Reported no alcohol in over 1 week and planned to remain abstinent.  He also stopped using marijuana.  Encouraged that he continue to abstain from all alcohol and illicit drugs, work on weight loss through diet and exercise, and we would plan to recheck liver enzymes in about 3 months. I did go ahead and order labs to screen for viral hepatitis.  Recent labs on file 04/09/2023 with LFTs within normal limits.  Labs scanned in 04/15/2023 with hepatitis C antibody nonreactive, hepatitis B core antibody, surface antibody, surface antigen all negative, hepatitis A antibody total negative.  AST 13, ALT 21, alk phos 75, platelets 211.  Today:   Past Medical History:  Diagnosis Date   A-fib (HCC)    Alcohol abuse    Alcoholic (HCC)    Anxiety    Colon polyp    Depression    GERD (gastroesophageal reflux disease)    GI bleed    Gout    Hypertension    Vertigo     Past Surgical History:  Procedure Laterality Date   ADENOIDECTOMY     BIOPSY  02/26/2019   Procedure: BIOPSY;  Surgeon: West Bali, MD;  [facility];  Service: Endoscopy;;  Gastric    BIOPSY  03/05/2019   Procedure: BIOPSY;  Surgeon: Corbin Ade, MD;  [facility];  Service: Endoscopy;;  ascending colon bx   COLONOSCOPY  07/2018   [place] piecemeal fashion status post tattooing, 30 x 18 mm sized sigmoid colon polyp removed via snare status post tattooing.  Recommended repeat colonoscopy in 3 months.  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   COLONOSCOPY WITH PROPOFOL N/A 03/05/2019   Procedure: COLONOSCOPY WITH PROPOFOL;  Surgeon: Corbin Ade, MD;  [facility];  Service: Endoscopy;  Laterality: N/A;   ESOPHAGOGASTRODUODENOSCOPY (EGD) WITH PROPOFOL N/A 02/26/2019   Dr. Darrick Penna: LA grade D esophagitis, gastritis with benign biopsies, no H. pylori, 5 nonbleeding superficial duodenal ulcers.  Felt to be NSAID related.   HERNIA REPAIR     inguinal-not sure which side   POLYPECTOMY  03/05/2019   Procedure: POLYPECTOMY;  Surgeon: Corbin Ade, MD;  [facility];  Service: Endoscopy;;  cold snare polypectomy   TONSILLECTOMY     UMBILICAL HERNIA REPAIR N/A 07/06/2020   Procedure: UMBILICAL HERNIA REPAIR WITH MESH;  Surgeon: Lucretia Roers, MD;  Location: [facility];  Service: General;  Laterality: N/A;    Current Outpatient Medications  Medication Sig Dispense Refill   apixaban (ELIQUIS) 5 MG TABS tablet Take 1 tablet (5 mg total) by mouth 2 (two) times  daily. 14 tablet 0   Cholecalciferol (VITAMIN D-3 PO) Take 1 capsule by mouth daily.     diltiazem (CARDIZEM CD) 180 MG 24 hr capsule Take 180 mg by mouth daily.     folic acid (FOLVITE) 1 MG tablet Take 1 tablet (1 mg total) by mouth daily. 30 tablet 1   levocetirizine (XYZAL) 5 MG tablet Take 5 mg by mouth at bedtime.     lidocaine (LIDODERM) 5 % Place 2 patches onto the skin daily. Remove & Discard patch within 12 hours or as directed by MD 9 patch 0   LORazepam (ATIVAN) 1 MG tablet Take 1 mg by mouth daily.     meclizine (ANTIVERT) 25 MG tablet Take 1 tablet  (25 mg total) by mouth 3 (three) times daily as needed for dizziness. 30 tablet 0   Multiple Vitamin (MULTIVITAMIN) tablet Take 1 tablet by mouth daily.     nebivolol (BYSTOLIC) 5 MG tablet Take 5 mg by mouth daily.     pantoprazole (PROTONIX) 40 MG tablet Take 40 mg by mouth daily.     sildenafil (VIAGRA) 100 MG tablet Take 1 tablet (100 mg total) by mouth as needed for erectile dysfunction. (Patient taking differently: Take 100 mg by mouth daily as needed for erectile dysfunction.) 20 tablet 5   thiamine (VITAMIN B-1) 100 MG tablet Take 1 tablet (100 mg total) by mouth daily. 30 tablet 1   valACYclovir (VALTREX) 500 MG tablet Take 500 mg by mouth 4 (four) times daily as needed (cold sores).     No current facility-administered medications for this visit.    Allergies as of 05/14/2023   (No Known Allergies)    Family History  Problem Relation Age of Onset   Cancer Mother    Heart disease Father    Heart disease Brother    Heart disease Other    Colon cancer Neg Hx    Gastric cancer Neg Hx    Esophageal cancer Neg Hx     Social History   Socioeconomic History   Marital status: Married    Spouse name: Not on file   Number of children: Not on file   Years of education: Not on file   Highest education level: Not on file  Occupational History   Not on file  Tobacco Use   Smoking status: Never   Smokeless tobacco: Never  Vaping Use   Vaping status: Never Used  Substance and Sexual Activity   Alcohol use: Not Currently    Alcohol/week: 70.0 standard drinks of alcohol    Types: 70 Cans of beer per week    Comment: 10 beers daily. Quit 02/02/23   Drug use: Not Currently    Types: Marijuana    Comment: Quit 02/02/23   Sexual activity: Yes  Other Topics Concern   Not on file  Social History Narrative   Not on file   Social Determinants of Health   Financial Resource Strain: Not on file  Food Insecurity: No Food Insecurity (02/02/2023)   Hunger Vital Sign    Worried  About Running Out of Food in the Last Year: Never true    Ran Out of Food in the Last Year: Never true  Transportation Needs: No Transportation Needs (02/02/2023)   PRAPARE - Administrator, Civil Service (Medical): No    Lack of Transportation (Non-Medical): No  Physical Activity: Not on file  Stress: Not on file  Social Connections: Not on file    Review  of Systems: Gen: Denies fever, chills, anorexia. Denies fatigue, weakness, weight loss.  CV: Denies chest pain, palpitations, syncope, peripheral edema, and claudication. Resp: Denies dyspnea at rest, cough, wheezing, coughing up blood, and pleurisy. GI: Denies vomiting blood, jaundice, and fecal incontinence.   Denies dysphagia or odynophagia. Derm: Denies rash, itching, dry skin Psych: Denies depression, anxiety, memory loss, confusion. No homicidal or suicidal ideation.  Heme: Denies bruising, bleeding, and enlarged lymph nodes.  Physical Exam: There were no vitals taken for this visit. General:   Alert and oriented. No distress noted. Pleasant and cooperative.  Head:  Normocephalic and atraumatic. Eyes:  Conjuctiva clear without scleral icterus. Heart:  S1, S2 present without murmurs appreciated. Lungs:  Clear to auscultation bilaterally. No wheezes, rales, or rhonchi. No distress.  Abdomen:  +BS, soft, non-tender and non-distended. No rebound or guarding. No HSM or masses noted. Msk:  Symmetrical without gross deformities. Normal posture. Extremities:  Without edema. Neurologic:  Alert and  oriented x4 Psych:  Normal mood and affect.    Assessment:     Plan:  ***   Ermalinda Memos, PA-C [facility] 05/14/2023

## 2023-05-14 ENCOUNTER — Ambulatory Visit: Payer: Medicare HMO | Admitting: Gastroenterology

## 2023-05-14 ENCOUNTER — Encounter: Payer: Self-pay | Admitting: Gastroenterology

## 2023-05-14 VITALS — BP 125/91 | HR 55 | Temp 97.6°F | Ht 71.0 in | Wt 230.0 lb

## 2023-05-14 DIAGNOSIS — R7989 Other specified abnormal findings of blood chemistry: Secondary | ICD-10-CM | POA: Diagnosis not present

## 2023-05-14 NOTE — Patient Instructions (Addendum)
Please have blood work completed at [facility]. [address] [place], [place] [address] Or  [address] [place], [place] [address]  Continue to limit alcohol.  If you are going to have an alcoholic beverage on occasion, I recommend no more than 2 drinks per 24 hours.  Instructions for fatty liver: Recommend 1-2# weight loss per week until ideal body weight through exercise & diet. Low fat/cholesterol diet.   Avoid sweets, sodas, fruit juices, sweetened beverages like tea, etc. Gradually increase exercise from 15 min daily up to 1 hr per day 5 days/week.  We will plan to see you back in 6 months or sooner if needed.  It was great to see you again today!  I am glad you are doing well overall!  Ermalinda Memos, PA-C [facility]

## 2023-08-13 DIAGNOSIS — R7303 Prediabetes: Secondary | ICD-10-CM | POA: Diagnosis not present

## 2023-08-13 DIAGNOSIS — E782 Mixed hyperlipidemia: Secondary | ICD-10-CM | POA: Diagnosis not present

## 2023-08-19 DIAGNOSIS — M4856XD Collapsed vertebra, not elsewhere classified, lumbar region, subsequent encounter for fracture with routine healing: Secondary | ICD-10-CM | POA: Diagnosis not present

## 2023-08-19 DIAGNOSIS — R42 Dizziness and giddiness: Secondary | ICD-10-CM | POA: Diagnosis not present

## 2023-08-19 DIAGNOSIS — R410 Disorientation, unspecified: Secondary | ICD-10-CM | POA: Diagnosis not present

## 2023-08-19 DIAGNOSIS — E782 Mixed hyperlipidemia: Secondary | ICD-10-CM | POA: Diagnosis not present

## 2023-08-19 DIAGNOSIS — F10988 Alcohol use, unspecified with other alcohol-induced disorder: Secondary | ICD-10-CM | POA: Diagnosis not present

## 2023-08-19 DIAGNOSIS — K7460 Unspecified cirrhosis of liver: Secondary | ICD-10-CM | POA: Diagnosis not present

## 2023-08-19 DIAGNOSIS — R7303 Prediabetes: Secondary | ICD-10-CM | POA: Diagnosis not present

## 2023-08-19 DIAGNOSIS — Z23 Encounter for immunization: Secondary | ICD-10-CM | POA: Diagnosis not present

## 2023-08-19 DIAGNOSIS — Y92009 Unspecified place in unspecified non-institutional (private) residence as the place of occurrence of the external cause: Secondary | ICD-10-CM | POA: Diagnosis not present

## 2023-08-26 DIAGNOSIS — L570 Actinic keratosis: Secondary | ICD-10-CM | POA: Diagnosis not present

## 2023-08-26 DIAGNOSIS — Z7189 Other specified counseling: Secondary | ICD-10-CM | POA: Diagnosis not present

## 2023-08-26 DIAGNOSIS — D492 Neoplasm of unspecified behavior of bone, soft tissue, and skin: Secondary | ICD-10-CM | POA: Diagnosis not present

## 2023-08-26 DIAGNOSIS — L821 Other seborrheic keratosis: Secondary | ICD-10-CM | POA: Diagnosis not present

## 2023-08-26 DIAGNOSIS — D225 Melanocytic nevi of trunk: Secondary | ICD-10-CM | POA: Diagnosis not present

## 2023-08-26 DIAGNOSIS — D0461 Carcinoma in situ of skin of right upper limb, including shoulder: Secondary | ICD-10-CM | POA: Diagnosis not present

## 2023-08-26 DIAGNOSIS — D045 Carcinoma in situ of skin of trunk: Secondary | ICD-10-CM | POA: Diagnosis not present

## 2023-08-26 DIAGNOSIS — L814 Other melanin hyperpigmentation: Secondary | ICD-10-CM | POA: Diagnosis not present

## 2023-09-09 DIAGNOSIS — D0461 Carcinoma in situ of skin of right upper limb, including shoulder: Secondary | ICD-10-CM | POA: Diagnosis not present

## 2023-10-20 ENCOUNTER — Ambulatory Visit: Payer: Medicare HMO | Attending: Cardiology | Admitting: Cardiology

## 2023-10-20 ENCOUNTER — Encounter: Payer: Self-pay | Admitting: Cardiology

## 2023-10-20 VITALS — BP 132/84 | HR 58 | Ht 71.0 in | Wt 229.6 lb

## 2023-10-20 DIAGNOSIS — I480 Paroxysmal atrial fibrillation: Secondary | ICD-10-CM | POA: Diagnosis not present

## 2023-10-20 DIAGNOSIS — D6869 Other thrombophilia: Secondary | ICD-10-CM | POA: Diagnosis not present

## 2023-10-20 DIAGNOSIS — I10 Essential (primary) hypertension: Secondary | ICD-10-CM | POA: Diagnosis not present

## 2023-10-20 DIAGNOSIS — I340 Nonrheumatic mitral (valve) insufficiency: Secondary | ICD-10-CM

## 2023-10-20 NOTE — Patient Instructions (Signed)
 Medication Instructions:  Your physician recommends that you continue on your current medications as directed. Please refer to the Current Medication list given to you today.  *If you need a refill on your cardiac medications before your next appointment, please call your pharmacy*   Lab Work: None If you have labs (blood work) drawn today and your tests are completely normal, you will receive your results only by: MyChart Message (if you have MyChart) OR A paper copy in the mail If you have any lab test that is abnormal or we need to change your treatment, we will call you to review the results.   Testing/Procedures: Noen   Follow-Up: At [facility], you and your health needs are our priority.  As part of our continuing mission to provide you with exceptional heart care, we have created designated Provider Care Teams.  These Care Teams include your primary Cardiologist (physician) and Advanced Practice Providers (APPs -  Physician Assistants and Nurse Practitioners) who all work together to provide you with the care you need, when you need it.  We recommend signing up for the patient portal called MyChart.  Sign up information is provided on this After Visit Summary.  MyChart is used to connect with patients for Virtual Visits (Telemedicine).  Patients are able to view lab/test results, encounter notes, upcoming appointments, etc.  Non-urgent messages can be sent to your provider as well.   To learn more about what you can do with MyChart, go to [email or web address].    Your next appointment:   6 month(s)  Provider:   You may see Alvan Carrier, MD or one of the following Advanced Practice Providers on your designated Care Team:   Laymon Qua, PA-C  Olivia Pavy, [place]     Other Instructions

## 2023-10-20 NOTE — Progress Notes (Signed)
 Clinical Summary Miguel Spencer is a 73 y.o.male seen today for follow up of the following medical problems.    1. PAF - admit with afib 08/2018 after not being able to take meds at home after being arrested - started on dilt gtt for afib with RVR. Restarted on his home toprol  and dilt   - previously followed in [place] , diagnosed with afib around 2017 - Dr Meliton [facility], [place] . - issues with insurance with eliquis  and xarelto. Had been on coumadin but stopped at time of his GI bleed  09/2018 Holter: PACs, rare runs of atach up to 3 beats. No symptoms reported     -denies any palpitations -no bleeding on eliquis .     2. EtOH abuse/Cirrhosis - followed by GI     3. History of GI bleeding - admission to [facility] in [place], [place]  with hematemsis and rectal bleeding 07/2018 while on coumadin - EGD and colonopscopy showed gastritis, polyp  - admitted 02/2019 with rectal bleeding while on eliquis  and indomethacin  for gout - 02/2019 colonoscopy concerning for ischemic colitis however CTA was benign. EGD diffuse mild inflammation, duodenal ulcers without bleeding. was to resume eliquis  03/16/19   - no recent issues.    4. Mitral regurgitation - mild to mod by 2019 echo -04/2022 echo: LVEF 55-60%, no WMAs, mild MR   5. HTN - side effects on chlorthaldone, tried for 3 months. Causes some dizziness, felt out of sorts - compliant with meds  - home bps 130s/80s-90s   6. Erectile dysfunction - not responding to cialis or viagra .  - he is seen by a urologist.  - uncler if could be a medication side effect, perhaps beta blocker   7.Syncope -ER visit 04/2023 -  benign labs, EKG rate controlled afib. He turned down CT head - was coming back from lunch. Went to get out of car, after standing fell to ground. EMS was called and brought to hospital/ +EtOH level - water 4-5 bottles a day in general    - no recent symptoms.      SH: just got married few  months ago Past Medical History:  Diagnosis Date   A-fib (HCC)    Alcohol abuse    Alcoholic (HCC)    Anxiety    Colon polyp    Depression    GERD (gastroesophageal reflux disease)    GI bleed    Gout    Hypertension    Vertigo      No Known Allergies   Current Outpatient Medications  Medication Sig Dispense Refill   apixaban  (ELIQUIS ) 5 MG TABS tablet Take 1 tablet (5 mg total) by mouth 2 (two) times daily. 14 tablet 0   Cholecalciferol (VITAMIN D-3 PO) Take 1 capsule by mouth daily.     diltiazem  (CARDIZEM  CD) 180 MG 24 hr capsule Take 180 mg by mouth daily.     LORazepam  (ATIVAN ) 1 MG tablet Take 1 mg by mouth daily.     Multiple Vitamin (MULTIVITAMIN) tablet Take 1 tablet by mouth daily.     nebivolol  (BYSTOLIC ) 5 MG tablet Take 5 mg by mouth daily.     pantoprazole  (PROTONIX ) 40 MG tablet Take 40 mg by mouth daily.     sildenafil  (VIAGRA ) 100 MG tablet Take 1 tablet (100 mg total) by mouth as needed for erectile dysfunction. (Patient taking differently: Take 100 mg by mouth daily as needed for erectile dysfunction.) 20 tablet 5   thiamine  (VITAMIN B-1) 100  MG tablet Take 1 tablet (100 mg total) by mouth daily. 30 tablet 1   valACYclovir (VALTREX) 500 MG tablet Take 500 mg by mouth 4 (four) times daily as needed (cold sores).     No current facility-administered medications for this visit.     Past Surgical History:  Procedure Laterality Date   ADENOIDECTOMY     BIOPSY  02/26/2019   Procedure: BIOPSY;  Surgeon: Harvey Margo CROME, MD;  [facility];  Service: Endoscopy;;  Gastric    BIOPSY  03/05/2019   Procedure: BIOPSY;  Surgeon: Shaaron Lamar HERO, MD;  [facility];  Service: Endoscopy;;  ascending colon bx   COLONOSCOPY  07/2018   [place]  piecemeal fashion status post tattooing, 30 x 18 mm sized sigmoid colon polyp removed via snare status post tattooing.  Recommended repeat colonoscopy in 3 months.  Pathology revealed tubular adenoma in the  ascending colon, tubulovillous adenoma of the sigmoid colon with areas of high-grade dysplasia but no carcinoma.   COLONOSCOPY WITH PROPOFOL  N/A 03/05/2019   Procedure: COLONOSCOPY WITH PROPOFOL ;  Surgeon: Shaaron Lamar HERO, MD;  [facility];  Service: Endoscopy;  Laterality: N/A;   ESOPHAGOGASTRODUODENOSCOPY (EGD) WITH PROPOFOL  N/A 02/26/2019   Dr. harvey: LA grade D esophagitis, gastritis with benign biopsies, no H. pylori, 5 nonbleeding superficial duodenal ulcers.  Felt to be NSAID related.   HERNIA REPAIR     inguinal-not sure which side   POLYPECTOMY  03/05/2019   Procedure: POLYPECTOMY;  Surgeon: Shaaron Lamar HERO, MD;  [facility];  Service: Endoscopy;;  cold snare polypectomy   TONSILLECTOMY     UMBILICAL HERNIA REPAIR N/A 07/06/2020   Procedure: UMBILICAL HERNIA REPAIR WITH MESH;  Surgeon: Kallie Manuelita BROCKS, MD;  Location: [facility];  Service: General;  Laterality: N/A;     No Known Allergies    Family History  Problem Relation Age of Onset   Cancer Mother    Heart disease Father    Heart disease Brother    Heart disease Other    Colon cancer Neg Hx    Gastric cancer Neg Hx    Esophageal cancer Neg Hx      Social History Mr. Pooley reports that he has never smoked. He has never used smokeless tobacco. Mr. Hyser reports that he does not currently use alcohol after a past usage of about 70.0 standard drinks of alcohol per week.   Review of Systems CONSTITUTIONAL: No weight loss, fever, chills, weakness or fatigue.  HEENT: Eyes: No visual loss, blurred vision, double vision or yellow sclerae.No hearing loss, sneezing, congestion, runny nose or sore throat.  SKIN: No rash or itching.  CARDIOVASCULAR: per hpi RESPIRATORY: No shortness of breath, cough or sputum.  GASTROINTESTINAL: No anorexia, nausea, vomiting or diarrhea. No abdominal pain or blood.  GENITOURINARY: No burning on urination, no polyuria NEUROLOGICAL: No headache, dizziness,  syncope, paralysis, ataxia, numbness or tingling in the extremities. No change in bowel or bladder control.  MUSCULOSKELETAL: No muscle, back pain, joint pain or stiffness.  LYMPHATICS: No enlarged nodes. No history of splenectomy.  PSYCHIATRIC: No history of depression or anxiety.  ENDOCRINOLOGIC: No reports of sweating, cold or heat intolerance. No polyuria or polydipsia.  SABRA   Physical Examination Today's Vitals   10/20/23 1349  BP: 132/84  Pulse: (!) 58  SpO2: 96%  Weight: 229 lb 9.6 oz (104.1 kg)  Height: 5' 11 (1.803 m)   Body mass index is 32.02 kg/m.  Gen: resting  comfortably, no acute distress HEENT: no scleral icterus, pupils equal round and reactive, no palptable cervical adenopathy,  CV: irreg, no m/rg, no jvd Resp: Clear to auscultation bilaterally GI: abdomen is soft, non-tender, non-distended, normal bowel sounds, no hepatosplenomegaly MSK: extremities are warm, no edema.  Skin: warm, no rash Neuro:  no focal deficits Psych: appropriate affect   Diagnostic Studies  08/2018 echo Study Conclusions   - Left ventricle: The cavity size was normal. Wall thickness was   increased in a pattern of mild LVH. Systolic function was normal.   The estimated ejection fraction was 50%. Diffuse hypokinesis. The   study was not technically sufficient to allow evaluation of LV   diastolic dysfunction due to atrial fibrillation. - Aortic valve: Mildly calcified annulus. Trileaflet; mildly   calcified leaflets. - Aortic root: The aortic root was mildly dilated. - Mitral valve: Mildly calcified annulus. There was mild to   moderate regurgitation. - Left atrium: The atrium was moderately dilated. - Right atrium: Central venous pressure (est): 15 mm Hg. - Atrial septum: No defect or patent foramen ovale was identified. - Tricuspid valve: There was mild regurgitation. - Pulmonary arteries: PA peak pressure: 46 mm Hg (S). - Pericardium, extracardiac: A trivial pericardial  effusion was   identified posterior to the heart.     09/2018 Holter: PACs, rare runs of atach up to 3 beats. No symptoms   04/2022 echo 1. Left ventricular ejection fraction, by estimation, is 55 to 60%. The  left ventricle has normal function. The left ventricle has no regional  wall motion abnormalities. There is mild left ventricular hypertrophy.  Left ventricular diastolic parameters  are indeterminate.   2. Right ventricular systolic function is normal. The right ventricular  size is normal. There is normal pulmonary artery systolic pressure.   3. Left atrial size was severely dilated.   4. Right atrial size was mildly dilated.   5. The mitral valve is normal in structure. Mild mitral valve  regurgitation. No evidence of mitral stenosis.   6. The tricuspid valve is abnormal.   7. The aortic valve has an indeterminant number of cusps. There is mild  calcification of the aortic valve. There is mild thickening of the aortic  valve. Aortic valve regurgitation is not visualized. very mild aortic  stenosis. Aortic valve mean gradient  measures 9.0 mmHg. Aortic valve peak gradient measures 16.0 mmHg. Aortic  valve area, by VTI measures 1.76 cm.   8. Aortic dilatation noted. There is mild dilatation of the aortic root,  measuring 40 mm.   9. The inferior vena cava is normal in size with greater than 50%  respiratory variability, suggesting right atrial pressure of 3 mmHg.      Assessment and Plan   1. PAF/acquired thrombophilia - no symptoms, continue current meds including eliquis  for stroke prevention   2. Mitral regurgitation -mild by recent US , we will continue to monitor   3. HTN -side effects on cholrthalidone, he stopped taking - bp's look fine on bystolic  and diltiazem  which he is also on for afib Continue current medical therapy.    4.Syncope - by history sounds like orthostatic syncope in setting of EtoH use. Prior history of alcoholism.  - no recurrent  episodes     Dorn PHEBE Ross, M.D.

## 2023-10-22 DIAGNOSIS — D045 Carcinoma in situ of skin of trunk: Secondary | ICD-10-CM | POA: Diagnosis not present

## 2023-11-10 NOTE — Progress Notes (Signed)
 Referring Provider: [name], MD Primary Care Physician:  [name], MD Primary GI Physician: Dr. Cindie  Chief Complaint  Patient presents with   Follow-up    Follow up on elevated LFT's    HPI:   Miguel Spencer is a 73 y.o. male with history of atrial fibrillation on Eliquis , alcohol abuse, HTN, GERD, duodenal ulcer in 2020, presenting today for follow-up of elevated LFTs.  Patient was found to have mildly elevated LFTs in 2024.  This was suspected to be secondary to chronic alcohol abuse.  He reported cutting back significantly on alcohol starting in early May and by the time of his office visit in August 2024, reported he was drinking a couple of drinks on special occasion whereas he had previously been drinking 7-8 pints of beer per day.  Noted his LFTs normalized in July with hepatitis A, B, C serologies all negative at that time as well.   Ultrasound elastography in April 2024 with fatty liver, median kPa 7.2. However, ultrasound elastography in March 2020 with Metavir fibrosis score showing some F3+ F4.   Previously recommended completing FibroSure in August 2024, but this was not completed.  Today: Most recent labs November 2024 with LFTs remaining within normal limits.  In the last 2-3 months, alcohol intake has increased. Drinking 6-7 pints of beer a day. Just likes it.   Partakes in marijuana use as well.   No tylenol , OTC supplements, herbal supplements.   No concerning GI symptoms.  Denies abdominal distention, lower extreme edema, yellowing of eyes or skin, bruising, bleeding, mental status changes, abdominal pain, nausea, vomiting, GERD symptoms, constipation, diarrhea.   Past Medical History:  Diagnosis Date   A-fib (HCC)    Alcohol abuse    Alcoholic (HCC)    Anxiety    Colon polyp    Depression    GERD (gastroesophageal reflux disease)    GI bleed    Gout    Hypertension    Vertigo     Past Surgical History:  Procedure Laterality Date    ADENOIDECTOMY     BIOPSY  02/26/2019   Procedure: BIOPSY;  Surgeon: Harvey Margo CROME, MD;  [facility];  Service: Endoscopy;;  Gastric    BIOPSY  03/05/2019   Procedure: BIOPSY;  Surgeon: Shaaron Lamar HERO, MD;  [facility];  Service: Endoscopy;;  ascending colon bx   COLONOSCOPY  07/2018   [place]  piecemeal fashion status post tattooing, 30 x 18 mm sized sigmoid colon polyp removed via snare status post tattooing.  Recommended repeat colonoscopy in 3 months.  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   COLONOSCOPY WITH PROPOFOL  N/A 03/05/2019   Procedure: COLONOSCOPY WITH PROPOFOL ;  Surgeon: Shaaron Lamar HERO, MD;  [facility];  Service: Endoscopy;  Laterality: N/A;   ESOPHAGOGASTRODUODENOSCOPY (EGD) WITH PROPOFOL  N/A 02/26/2019   Dr. harvey: LA grade D esophagitis, gastritis with benign biopsies, no H. pylori, 5 nonbleeding superficial duodenal ulcers.  Felt to be NSAID related.   HERNIA REPAIR     inguinal-not sure which side   POLYPECTOMY  03/05/2019   Procedure: POLYPECTOMY;  Surgeon: Shaaron Lamar HERO, MD;  [facility];  Service: Endoscopy;;  cold snare polypectomy   TONSILLECTOMY     UMBILICAL HERNIA REPAIR N/A 07/06/2020   Procedure: UMBILICAL HERNIA REPAIR WITH MESH;  Surgeon: Kallie Manuelita BROCKS, MD;  Location: [facility];  Service: General;  Laterality: N/A;    Current Outpatient Medications  Medication Sig Dispense Refill   apixaban  (ELIQUIS ) 5 MG TABS tablet Take 1 tablet (5 mg total) by mouth 2 (two) times daily. 14 tablet 0   Cholecalciferol (VITAMIN D-3 PO) Take 1 capsule by mouth daily.     diltiazem  (CARDIZEM  CD) 180 MG 24 hr capsule Take 180 mg by mouth daily.     LORazepam  (ATIVAN ) 1 MG tablet Take 1 mg by mouth daily.     meclizine  (ANTIVERT ) 25 MG tablet Take 25 mg by mouth 3 (three) times daily as needed.     Multiple  Vitamin (MULTIVITAMIN) tablet Take 1 tablet by mouth daily.     nebivolol  (BYSTOLIC ) 5 MG tablet Take 5 mg by mouth daily.     pantoprazole  (PROTONIX ) 40 MG tablet Take 40 mg by mouth daily.     thiamine  (VITAMIN B-1) 100 MG tablet Take 1 tablet (100 mg total) by mouth daily. 30 tablet 1   valACYclovir (VALTREX) 500 MG tablet Take 500 mg by mouth 4 (four) times daily as needed (cold sores).     sildenafil  (VIAGRA ) 100 MG tablet Take 1 tablet (100 mg total) by mouth as needed for erectile dysfunction. (Patient taking differently: Take 100 mg by mouth daily as needed for erectile dysfunction.) 20 tablet 5   No current facility-administered medications for this visit.    Allergies as of 11/12/2023   (No Known Allergies)    Family History  Problem Relation Age of Onset   Cancer Mother    Heart disease Father    Heart disease Brother    Heart disease Other    Colon cancer Neg Hx    Gastric cancer Neg Hx    Esophageal cancer Neg Hx     Social History   Socioeconomic History   Marital status: Married    Spouse name: Not on file   Number of children: Not on file   Years of education: Not on file   Highest education level: Not on file  Occupational History   Not on file  Tobacco Use   Smoking status: Never   Smokeless tobacco: Never  Vaping Use   Vaping status: Never Used  Substance and Sexual Activity   Alcohol use: Not Currently    Alcohol/week: 70.0 standard drinks of alcohol    Types: 70 Cans of beer per week    Comment: 10 beers daily. Quit 02/02/23   Drug use: Not Currently    Types: Marijuana    Comment: Quit 02/02/23   Sexual activity: Yes  Other Topics Concern   Not on file  Social History Narrative   Not on file   Social Drivers of Health   Financial Resource Strain: Not on file  Food Insecurity: No Food Insecurity (02/02/2023)   Hunger Vital Sign    Worried About Running Out of Food in the Last Year: Never true    Ran Out of Food in the Last Year: Never true   Transportation Needs: No Transportation Needs (02/02/2023)   PRAPARE - Administrator, Civil Service (Medical): No    Lack of Transportation (Non-Medical): No  Physical Activity: Not on file  Stress: Not on file  Social Connections: Not on file    Review of Systems: Gen: Denies fever, chills, cold or flulike symptoms, presyncope, syncope. CV: Denies chest pain, palpitations. Resp: Denies dyspnea, cough.  GI: See HPI Heme: See HPI  Physical Exam:  BP 118/82   Pulse 71   Temp 98.3 F (36.8 C)   Ht 5' 11 (1.803 m)   Wt 239 lb 12.8 oz (108.8 kg)   BMI 33.45 kg/m  General:  Alert and oriented. No distress noted. Pleasant and cooperative.  Head:  Normocephalic and atraumatic. Eyes:  Conjuctiva clear without scleral icterus. Abdomen:  +BS, soft, non-tender and non-distended. No rebound or guarding. No HSM or masses noted. Msk:  Symmetrical without gross deformities. Normal posture. Extremities:  Without edema. Neurologic:  Alert and  oriented x4 Psych:  Normal mood and affect.    Assessment:  73 y.o. male with history of atrial fibrillation on Eliquis , alcohol abuse, HTN, GERD, duodenal ulcer in 2020, presenting today for follow-up of elevated LFTs.   Elevated LFTs: Found to have mildly elevated LFTs earlier this year with max of AST and ALT both at 53 in April 2024, likely secondary to chronic alcohol abuse.  Prior evaluation negative for hepatitis A, B, C.  Ultrasound elastography in April 2024 with fatty liver, median kPa 7.2. However, ultrasound elastography in March 2020 with Metavir fibrosis score showing some F3+ F4. I have previously recommended Fibrosure but this hasn't been completed.  Encouragingly, LFTs normalized in July 2024 and remained normal in November. Clinically, he remains without any signs or symptoms of decompensated liver disease.  Unfortunately, he has resumed drinking significant amounts of alcohol over the last 2 to 3 months, drinking 6 to 7  pints of beer per day.   Plan:  [facility] FibroSure Counseled on strict alcohol abstinence Recommend 1-2# weight loss per week until ideal body weight through exercise & diet. Low fat/cholesterol diet.   Avoid sweets, sodas, fruit juices, sweetened beverages like tea, etc. Gradually increase exercise from 15 min daily up to 1 hr per day 5 days/week. Further recommendation/time to follow-up to be determined pending lab results.   Josette Centers, PA-C [facility] 11/12/2023

## 2023-11-12 ENCOUNTER — Encounter: Payer: Self-pay | Admitting: Gastroenterology

## 2023-11-12 ENCOUNTER — Ambulatory Visit (INDEPENDENT_AMBULATORY_CARE_PROVIDER_SITE_OTHER): Payer: Medicare HMO | Admitting: Gastroenterology

## 2023-11-12 VITALS — BP 118/82 | HR 71 | Temp 98.3°F | Ht 71.0 in | Wt 239.8 lb

## 2023-11-12 DIAGNOSIS — K760 Fatty (change of) liver, not elsewhere classified: Secondary | ICD-10-CM

## 2023-11-12 DIAGNOSIS — F1010 Alcohol abuse, uncomplicated: Secondary | ICD-10-CM | POA: Diagnosis not present

## 2023-11-12 DIAGNOSIS — R7989 Other specified abnormal findings of blood chemistry: Secondary | ICD-10-CM

## 2023-11-12 NOTE — Patient Instructions (Signed)
 Please have blood work completed at [facility].  As we discussed, it is very important that you stop drinking alcohol altogether.  Recommend 1-2# weight loss per week until ideal body weight through exercise & diet. Low fat/cholesterol diet.   Avoid sweets, sodas, fruit juices, sweetened beverages like tea, etc. Gradually increase exercise from 15 min daily up to 1 hr per day 5 days/week.   Further recommendations pending lab results.  Josette Centers, PA-C [facility]

## 2023-12-16 DIAGNOSIS — R7989 Other specified abnormal findings of blood chemistry: Secondary | ICD-10-CM | POA: Diagnosis not present

## 2023-12-18 LAB — HEPATIC FUNCTION PANEL
ALT: 24 IU/L (ref 0–44)
AST: 18 IU/L (ref 0–40)
Albumin: 4.6 g/dL (ref 3.8–4.8)
Alkaline Phosphatase: 76 IU/L (ref 44–121)
Bilirubin Total: 0.4 mg/dL (ref 0.0–1.2)
Bilirubin, Direct: 0.16 mg/dL (ref 0.00–0.40)
Total Protein: 6.8 g/dL (ref 6.0–8.5)

## 2023-12-18 LAB — ASH FIBROSURE

## 2024-03-04 DIAGNOSIS — E782 Mixed hyperlipidemia: Secondary | ICD-10-CM | POA: Diagnosis not present

## 2024-03-04 DIAGNOSIS — R7303 Prediabetes: Secondary | ICD-10-CM | POA: Diagnosis not present

## 2024-03-09 DIAGNOSIS — E782 Mixed hyperlipidemia: Secondary | ICD-10-CM | POA: Diagnosis not present

## 2024-03-09 DIAGNOSIS — I480 Paroxysmal atrial fibrillation: Secondary | ICD-10-CM | POA: Diagnosis not present

## 2024-03-09 DIAGNOSIS — Z23 Encounter for immunization: Secondary | ICD-10-CM | POA: Diagnosis not present

## 2024-03-09 DIAGNOSIS — F10988 Alcohol use, unspecified with other alcohol-induced disorder: Secondary | ICD-10-CM | POA: Diagnosis not present

## 2024-03-09 DIAGNOSIS — I10 Essential (primary) hypertension: Secondary | ICD-10-CM | POA: Diagnosis not present

## 2024-03-09 DIAGNOSIS — R7303 Prediabetes: Secondary | ICD-10-CM | POA: Diagnosis not present

## 2024-03-09 DIAGNOSIS — K219 Gastro-esophageal reflux disease without esophagitis: Secondary | ICD-10-CM | POA: Diagnosis not present

## 2024-03-09 DIAGNOSIS — D509 Iron deficiency anemia, unspecified: Secondary | ICD-10-CM | POA: Diagnosis not present

## 2024-03-09 DIAGNOSIS — K7460 Unspecified cirrhosis of liver: Secondary | ICD-10-CM | POA: Diagnosis not present

## 2024-04-08 ENCOUNTER — Inpatient Hospital Stay (HOSPITAL_COMMUNITY)
Admission: EM | Admit: 2024-04-08 | Discharge: 2024-04-10 | DRG: 641 | Disposition: A | Discharge status: Home / Self Care | Attending: Family Medicine | Admitting: Family Medicine

## 2024-04-08 ENCOUNTER — Other Ambulatory Visit: Payer: Self-pay

## 2024-04-08 ENCOUNTER — Emergency Department (HOSPITAL_COMMUNITY)

## 2024-04-08 DIAGNOSIS — F10129 Alcohol abuse with intoxication, unspecified: Secondary | ICD-10-CM | POA: Diagnosis present

## 2024-04-08 DIAGNOSIS — R112 Nausea with vomiting, unspecified: Secondary | ICD-10-CM | POA: Diagnosis not present

## 2024-04-08 DIAGNOSIS — I959 Hypotension, unspecified: Secondary | ICD-10-CM | POA: Diagnosis not present

## 2024-04-08 DIAGNOSIS — Z860101 Personal history of adenomatous and serrated colon polyps: Secondary | ICD-10-CM

## 2024-04-08 DIAGNOSIS — I4891 Unspecified atrial fibrillation: Secondary | ICD-10-CM | POA: Diagnosis not present

## 2024-04-08 DIAGNOSIS — Z79899 Other long term (current) drug therapy: Secondary | ICD-10-CM

## 2024-04-08 DIAGNOSIS — F419 Anxiety disorder, unspecified: Secondary | ICD-10-CM | POA: Diagnosis present

## 2024-04-08 DIAGNOSIS — D6959 Other secondary thrombocytopenia: Secondary | ICD-10-CM | POA: Diagnosis present

## 2024-04-08 DIAGNOSIS — F1010 Alcohol abuse, uncomplicated: Secondary | ICD-10-CM | POA: Diagnosis not present

## 2024-04-08 DIAGNOSIS — E876 Hypokalemia: Secondary | ICD-10-CM | POA: Diagnosis not present

## 2024-04-08 DIAGNOSIS — E871 Hypo-osmolality and hyponatremia: Secondary | ICD-10-CM | POA: Diagnosis present

## 2024-04-08 DIAGNOSIS — R55 Syncope and collapse: Secondary | ICD-10-CM | POA: Diagnosis present

## 2024-04-08 DIAGNOSIS — I10 Essential (primary) hypertension: Secondary | ICD-10-CM | POA: Diagnosis present

## 2024-04-08 DIAGNOSIS — Z809 Family history of malignant neoplasm, unspecified: Secondary | ICD-10-CM | POA: Diagnosis not present

## 2024-04-08 DIAGNOSIS — E878 Other disorders of electrolyte and fluid balance, not elsewhere classified: Secondary | ICD-10-CM | POA: Diagnosis present

## 2024-04-08 DIAGNOSIS — Z7901 Long term (current) use of anticoagulants: Secondary | ICD-10-CM | POA: Diagnosis not present

## 2024-04-08 DIAGNOSIS — I700 Atherosclerosis of aorta: Secondary | ICD-10-CM | POA: Diagnosis not present

## 2024-04-08 DIAGNOSIS — Z8249 Family history of ischemic heart disease and other diseases of the circulatory system: Secondary | ICD-10-CM

## 2024-04-08 DIAGNOSIS — K219 Gastro-esophageal reflux disease without esophagitis: Secondary | ICD-10-CM | POA: Diagnosis present

## 2024-04-08 DIAGNOSIS — I482 Chronic atrial fibrillation, unspecified: Secondary | ICD-10-CM | POA: Diagnosis present

## 2024-04-08 LAB — PROTIME-INR
INR: 1.2 (ref 0.8–1.2)
Prothrombin Time: 15.8 s — ABNORMAL HIGH (ref 11.4–15.2)

## 2024-04-08 LAB — URINALYSIS, ROUTINE W REFLEX MICROSCOPIC
Bilirubin Urine: NEGATIVE
Glucose, UA: NEGATIVE mg/dL
Hgb urine dipstick: NEGATIVE
Ketones, ur: NEGATIVE mg/dL
Leukocytes,Ua: NEGATIVE
Nitrite: NEGATIVE
Protein, ur: NEGATIVE mg/dL
Specific Gravity, Urine: 1.008 (ref 1.005–1.030)
pH: 6 (ref 5.0–8.0)

## 2024-04-08 LAB — COMPREHENSIVE METABOLIC PANEL WITH GFR
ALT: 25 U/L (ref 0–44)
AST: 26 U/L (ref 15–41)
Albumin: 3.2 g/dL — ABNORMAL LOW (ref 3.5–5.0)
Alkaline Phosphatase: 48 U/L (ref 38–126)
Anion gap: 15 (ref 5–15)
BUN: 14 mg/dL (ref 8–23)
CO2: 18 mmol/L — ABNORMAL LOW (ref 22–32)
Calcium: 7.9 mg/dL — ABNORMAL LOW (ref 8.9–10.3)
Chloride: 94 mmol/L — ABNORMAL LOW (ref 98–111)
Creatinine, Ser: 1.06 mg/dL (ref 0.61–1.24)
GFR, Estimated: >60 mL/min (ref >60)
Glucose, Bld: 130 mg/dL — ABNORMAL HIGH (ref 70–99)
Potassium: 2.6 mmol/L — CL (ref 3.5–5.1)
Sodium: 127 mmol/L — ABNORMAL LOW (ref 135–145)
Total Bilirubin: 0.5 mg/dL (ref 0.0–1.2)
Total Protein: 5.8 g/dL — ABNORMAL LOW (ref 6.5–8.1)

## 2024-04-08 LAB — LACTIC ACID, PLASMA
Lactic Acid, Venous: 2.9 mmol/L (ref 0.5–1.9)
Lactic Acid, Venous: 2.6 mmol/L (ref 0.5–1.9)

## 2024-04-08 LAB — CBC
HCT: 36.6 % — ABNORMAL LOW (ref 39.0–52.0)
Hemoglobin: 13 g/dL (ref 13.0–17.0)
MCH: 33.3 pg (ref 26.0–34.0)
MCHC: 35.5 g/dL (ref 30.0–36.0)
MCV: 93.8 fL (ref 80.0–100.0)
Platelets: 134 10*3/uL — ABNORMAL LOW (ref 150–400)
RBC: 3.9 MIL/uL — ABNORMAL LOW (ref 4.22–5.81)
RDW: 13.8 % (ref 11.5–15.5)
WBC: 6.4 10*3/uL (ref 4.0–10.5)
nRBC: 0 % (ref 0.0–0.2)

## 2024-04-08 LAB — MAGNESIUM: Magnesium: 1.6 mg/dL — ABNORMAL LOW (ref 1.7–2.4)

## 2024-04-08 LAB — BRAIN NATRIURETIC PEPTIDE: B Natriuretic Peptide: 175 pg/mL — ABNORMAL HIGH (ref 0.0–100.0)

## 2024-04-08 LAB — LIPASE, BLOOD: Lipase: 39 U/L (ref 11–51)

## 2024-04-08 LAB — ETHANOL: Alcohol, Ethyl (B): 45 mg/dL — ABNORMAL HIGH (ref <15)

## 2024-04-08 MED ORDER — POTASSIUM CHLORIDE 10 MEQ/100ML IV SOLN
10.0000 meq | INTRAVENOUS | Status: AC
  Administered 2024-04-08 – 2024-04-09 (×4): 10 meq via INTRAVENOUS
  Filled 2024-04-08 (×4): qty 100

## 2024-04-08 MED ORDER — ACETAMINOPHEN 650 MG RE SUPP: 650.0000 mg | Freq: Four times a day (QID) | RECTAL | Status: DC | PRN

## 2024-04-08 MED ORDER — NEBIVOLOL HCL 10 MG PO TABS
5.0000 mg | ORAL_TABLET | Freq: Every day | ORAL | Status: DC
  Administered 2024-04-09 – 2024-04-10 (×2): 5 mg via ORAL
  Filled 2024-04-08 (×3): qty 1

## 2024-04-08 MED ORDER — SODIUM CHLORIDE 0.9 % IV BOLUS
500.0000 mL | Freq: Once | INTRAVENOUS | Status: AC
  Administered 2024-04-08: 500 mL via INTRAVENOUS

## 2024-04-08 MED ORDER — TRAZODONE HCL 50 MG PO TABS
25.0000 mg | ORAL_TABLET | Freq: Every evening | ORAL | Status: DC | PRN
  Administered 2024-04-09: 25 mg via ORAL
  Filled 2024-04-08: qty 1

## 2024-04-08 MED ORDER — LORAZEPAM 1 MG PO TABS
1.0000 mg | ORAL_TABLET | Freq: Every day | ORAL | Status: DC
  Administered 2024-04-09: 1 mg via ORAL
  Filled 2024-04-08: qty 1

## 2024-04-08 MED ORDER — POTASSIUM CHLORIDE IN NACL 20-0.9 MEQ/L-% IV SOLN: INTRAVENOUS | Status: DC

## 2024-04-08 MED ORDER — ACETAMINOPHEN 325 MG PO TABS: 650.0000 mg | ORAL_TABLET | Freq: Four times a day (QID) | ORAL | Status: DC | PRN

## 2024-04-08 MED ORDER — METOCLOPRAMIDE HCL 5 MG/ML IJ SOLN: 10.0000 mg | Freq: Four times a day (QID) | INTRAMUSCULAR | Status: DC | PRN

## 2024-04-08 MED ORDER — PANTOPRAZOLE SODIUM 40 MG PO TBEC: 40.0000 mg | DELAYED_RELEASE_TABLET | Freq: Every day | ORAL | Status: DC

## 2024-04-08 MED ORDER — DILTIAZEM HCL ER COATED BEADS 180 MG PO CP24
180.0000 mg | ORAL_CAPSULE | Freq: Every day | ORAL | Status: DC
  Administered 2024-04-09 – 2024-04-10 (×2): 180 mg via ORAL
  Filled 2024-04-08 (×2): qty 1

## 2024-04-08 MED ORDER — MAGNESIUM HYDROXIDE 400 MG/5ML PO SUSP: 30.0000 mL | Freq: Every day | ORAL | Status: DC | PRN

## 2024-04-08 MED ORDER — MAGNESIUM SULFATE 2 GM/50ML IV SOLN
2.0000 g | Freq: Once | INTRAVENOUS | Status: AC
  Administered 2024-04-08: 2 g via INTRAVENOUS
  Filled 2024-04-08: qty 50

## 2024-04-08 MED ORDER — ALUM & MAG HYDROXIDE-SIMETH 200-200-20 MG/5ML PO SUSP: 30.0000 mL | ORAL | Status: DC | PRN

## 2024-04-08 MED ORDER — PANTOPRAZOLE SODIUM 40 MG IV SOLR
40.0000 mg | Freq: Two times a day (BID) | INTRAVENOUS | Status: DC
  Administered 2024-04-09 – 2024-04-10 (×4): 40 mg via INTRAVENOUS
  Filled 2024-04-08 (×4): qty 10

## 2024-04-08 MED ORDER — APIXABAN 5 MG PO TABS
5.0000 mg | ORAL_TABLET | Freq: Two times a day (BID) | ORAL | Status: DC
  Administered 2024-04-09 – 2024-04-10 (×4): 5 mg via ORAL
  Filled 2024-04-08 (×4): qty 1

## 2024-04-08 MED ORDER — POTASSIUM CHLORIDE CRYS ER 20 MEQ PO TBCR
40.0000 meq | EXTENDED_RELEASE_TABLET | Freq: Once | ORAL | Status: AC
  Administered 2024-04-08: 40 meq via ORAL
  Filled 2024-04-08: qty 2

## 2024-04-08 NOTE — ED Notes (Signed)
 Urine sample collected

## 2024-04-08 NOTE — ED Notes (Signed)
 Notified we need urine sample; urinal at bedside

## 2024-04-08 NOTE — ED Notes (Signed)
 Updated wife for patient regarding admission

## 2024-04-08 NOTE — ED Provider Notes (Signed)
 [facility]ERG[facility]Y DEPARTMENT AT [facility] Provider Note   CSN: [id number] Arrival date & time: 04/08/24  8086     Patient presents with: Loss of Consciousness and Emesis   Miguel Spencer is a 73 y.o. male.  He is brought in by ambulance from home after syncopal event.  He said he had multiple episodes of vomiting today.  Denies any headache or chest pain.  Does feel a little short of breath.  No abdominal pain.  No diarrhea.  No fevers or chills.  History of A-fib and is on a blood thinner.  He said he thinks maybe his blood pressure went low that led to his vomiting.  Did endorse drinking 5 pints of beer today, not unusual for him.   The history is provided by the patient.  Loss of Consciousness Episode history:  Single Most recent episode:  Today Progression:  Resolved Chronicity:  New Associated symptoms: nausea, shortness of breath and vomiting   Associated symptoms: no chest pain, no fever and no headaches   Emesis Associated symptoms: no abdominal pain, no diarrhea, no fever and no headaches        Prior to Admission medications   Medication Sig Start Date End Date Taking? Authorizing Provider  apixaban  (ELIQUIS ) 5 MG TABS tablet Take 1 tablet (5 mg total) by mouth 2 (two) times daily. 04/05/19   Alvan Dorn FALCON, MD  Cholecalciferol (VITAMIN D-3 PO) Take 1 capsule by mouth daily.    [provider]  diltiazem  (CARDIZEM  CD) 180 MG 24 hr capsule Take 180 mg by mouth daily.    [provider]  LORazepam  (ATIVAN ) 1 MG tablet Take 1 mg by mouth daily. 01/07/20   [provider]  meclizine  (ANTIVERT ) 25 MG tablet Take 25 mg by mouth 3 (three) times daily as needed. 07/09/23   [provider]  Multiple Vitamin (MULTIVITAMIN) tablet Take 1 tablet by mouth daily.    [provider]  nebivolol  (BYSTOLIC ) 5 MG tablet Take 5 mg by mouth daily. 02/25/23   [provider]  pantoprazole  (PROTONIX ) 40 MG tablet Take 40 mg by  mouth daily.    [provider]  sildenafil  (VIAGRA ) 100 MG tablet Take 1 tablet (100 mg total) by mouth as needed for erectile dysfunction. Patient taking differently: Take 100 mg by mouth daily as needed for erectile dysfunction. 07/11/22 10/20/23  Nieves Cough, MD  thiamine  (VITAMIN B-1) 100 MG tablet Take 1 tablet (100 mg total) by mouth daily. 02/04/23   Johnson, Clanford L, MD  valACYclovir (VALTREX) 500 MG tablet Take 500 mg by mouth 4 (four) times daily as needed (cold sores).    [provider]    Allergies: Patient has no known allergies.    Review of Systems  Constitutional:  Negative for fever.  Eyes:  Negative for visual disturbance.  Respiratory:  Positive for shortness of breath.   Cardiovascular:  Positive for syncope. Negative for chest pain.  Gastrointestinal:  Positive for nausea and vomiting. Negative for abdominal pain and diarrhea.  Genitourinary:  Negative for dysuria.  Musculoskeletal:  Negative for neck pain.  Neurological:  Positive for syncope. Negative for headaches.    Updated Vital Signs BP (!) 84/59   Pulse 66   Temp 98 F (36.7 C)   Resp 18   Ht 5' 11 (1.803 m)   Wt 109 kg   SpO2 (!) 89%   BMI 33.52 kg/m   Physical Exam Vitals and nursing note reviewed.  Constitutional:      General: He is not in acute distress.    Appearance: He is well-developed.  HENT:     Head: Normocephalic and atraumatic.  Eyes:     Conjunctiva/sclera: Conjunctivae normal.  Cardiovascular:     Rate and Rhythm: Normal rate. Rhythm irregular.     Heart sounds: No murmur heard. Pulmonary:     Effort: Pulmonary effort is normal. No respiratory distress.     Breath sounds: Normal breath sounds.  Abdominal:     Palpations: Abdomen is soft.     Tenderness: There is no abdominal tenderness. There is no guarding or rebound.  Musculoskeletal:        General: No deformity. Normal range of motion.     Cervical back: Neck supple.  Skin:    General:  Skin is warm and dry.     Capillary Refill: Capillary refill takes less than 2 seconds.  Neurological:     General: No focal deficit present.     Mental Status: He is alert.     Sensory: No sensory deficit.     Motor: No weakness.  Psychiatric:        Mood and Affect: Mood normal.     (all labs ordered are listed, but only abnormal results are displayed) Labs Reviewed  COMPREHENSIVE METABOLIC PANEL WITH GFR - Abnormal; Notable for the following components:      Result Value   Sodium 127 (*)    Potassium 2.6 (*)    Chloride 94 (*)    CO2 18 (*)    Glucose, Bld 130 (*)    Calcium 7.9 (*)    Total Protein 5.8 (*)    Albumin 3.2 (*)    All other components within normal limits  CBC - Abnormal; Notable for the following components:   RBC 3.90 (*)    HCT 36.6 (*)    Platelets 134 (*)    All other components within normal limits  ETHANOL - Abnormal; Notable for the following components:   Alcohol, Ethyl (B) 45 (*)    All other components within normal limits  MAGNESIUM  - Abnormal; Notable for the following components:   Magnesium  1.6 (*)    All other components within normal limits  PROTIME-INR - Abnormal; Notable for the following components:   Prothrombin Time 15.8 (*)    All other components within normal limits  BRAIN NATRIURETIC PEPTIDE - Abnormal; Notable for the following components:   B Natriuretic Peptide 175.0 (*)    All other components within normal limits  LACTIC ACID, PLASMA - Abnormal; Notable for the following components:   Lactic Acid, Venous 2.9 (*)    All other components within normal limits  LACTIC ACID, PLASMA - Abnormal; Notable for the following components:   Lactic Acid, Venous 2.6 (*)    All other components within normal limits  BASIC METABOLIC PANEL WITH GFR - Abnormal; Notable for the following components:   Sodium 129 (*)    CO2 20 (*)    Glucose, Bld 124 (*)    Calcium 8.0 (*)    All other components within normal limits  CBC - Abnormal;  Notable for the following components:   Platelets 143 (*)    All other components within normal limits  LIPASE, BLOOD  URINALYSIS, ROUTINE W REFLEX MICROSCOPIC  RAPID URINE DRUG SCREEN, HOSP PERFORMED    EKG: EKG Interpretation Date/Time:  Thursday April 08 2024 19:27:39 EDT Ventricular Rate:  87 PR Interval:    QRS  Duration:  102 QT Interval:  415 QTC Calculation: 500 R Axis:   36  Text Interpretation: Atrial fibrillation Borderline low voltage, extremity leads Borderline prolonged QT interval increased rate from prior 7/24 Confirmed by Towana Sharper [phone]) on 04/08/2024 7:36:00 PM  Radiology: [facility] Chest Port 1 View Result Date: 04/08/2024 CLINICAL DATA:  Sob emesis, hypotension, and LOC that started today. Denies sick contacts. When asked for number of episodes of emesis a lot History of etoh abuse. Admits to 5 pints of beer today. And a little marijuana EXAM: PORTABLE CHEST 1 VIEW COMPARISON:  Chest x-ray 08/13/2018, CT chest 01/19/2013 FINDINGS: Patient is rotated. The heart and mediastinal contours are unchanged given patient positioning. Atherosclerotic plaque. No focal consolidation. No pulmonary edema. No pleural effusion. No pneumothorax. No acute osseous abnormality. IMPRESSION: 1. No active disease. 2.  Aortic Atherosclerosis (ICD10-I70.0). Electronically Signed   By: Morgane  Naveau M.D.   On: 04/08/2024 20:15     Procedures   Medications Ordered in the ED  nebivolol  (BYSTOLIC ) tablet 5 mg (5 mg Oral Given 04/09/24 0838)  diltiazem  (CARDIZEM  CD) 24 hr capsule 180 mg (180 mg Oral Given 04/09/24 0839)  apixaban  (ELIQUIS ) tablet 5 mg (5 mg Oral Given 04/09/24 0839)  acetaminophen  (TYLENOL ) tablet 650 mg (has no administration in time range)    Or  acetaminophen  (TYLENOL ) suppository 650 mg (has no administration in time range)  traZODone  (DESYREL ) tablet 25 mg (has no administration in time range)  magnesium  hydroxide (MILK OF MAGNESIA) suspension 30 mL (has no administration in  time range)  metoCLOPramide  (REGLAN ) injection 10 mg (has no administration in time range)  pantoprazole  (PROTONIX ) injection 40 mg (40 mg Intravenous Given 04/09/24 0838)  alum & mag hydroxide-simeth (MAALOX/MYLANTA) 200-200-20 MG/5ML suspension 30 mL (has no administration in time range)  0.9 % NaCl with KCl 20 mEq/ L  infusion ( Intravenous New Bag/Given 04/09/24 0840)  chlordiazePOXIDE  (LIBRIUM ) capsule 25 mg (has no administration in time range)    Followed by  chlordiazePOXIDE  (LIBRIUM ) capsule 10 mg (has no administration in time range)    Followed by  chlordiazePOXIDE  (LIBRIUM ) capsule 5 mg (has no administration in time range)  LORazepam  (ATIVAN ) tablet 1-4 mg (has no administration in time range)    Or  LORazepam  (ATIVAN ) tablet 1 mg (has no administration in time range)  thiamine  (VITAMIN B1) tablet 100 mg (has no administration in time range)    Or  thiamine  (VITAMIN B1) injection 100 mg (has no administration in time range)  folic acid  (FOLVITE ) tablet 1 mg (has no administration in time range)  multivitamin with minerals tablet 1 tablet (has no administration in time range)  sodium chloride  0.9 % bolus 500 mL (0 mLs Intravenous Stopped 04/08/24 2024)  magnesium  sulfate IVPB 2 g 50 mL (0 g Intravenous Stopped 04/08/24 2157)  sodium chloride  0.9 % bolus 500 mL (500 mLs Intravenous New Bag/Given 04/08/24 2120)  potassium chloride  SA (KLOR-CON  M) CR tablet 40 mEq (40 mEq Oral Given 04/08/24 2121)  potassium chloride  10 mEq in 100 mL IVPB (10 mEq Intravenous New Bag/Given 04/09/24 0317)    Clinical Course as of 04/09/24 1035  Thu Apr 08, 2024  2006 chest x-ray interpreted by me as no acute infiltrate.  Awaiting radiology reading. [MB]  2152 Patient denying any pain.  Lab work coming back with a low potassium and magnesium , elevated lactate.  Blood pressures were initially low and have come back over 100 now.  He said he feels just very drained.  No fever or white count.  Still needs to get as  needed.  Will need admission for further workup. [MB]  2229 Discussed with [facility] hospitalist Dr. Lawence who will evaluate patient for admission. [MB]    Clinical Course User Index [MB] Towana Ozell BROCKS, MD                                 Medical Decision Making Amount and/or Complexity of Data Reviewed Labs: ordered. Radiology: ordered.  Risk Prescription drug management. Decision regarding hospitalization.   This patient complains of syncopal event in the setting of multiple episodes of vomiting; this involves an extensive number of treatment Options and is a complaint that carries with it a high risk of complications and morbidity. The differential includes vasovagal syncope, infection, dehydration, metabolic derangement, ACS  I ordered, reviewed and interpreted labs, which included CBC with normal white count, mildly low platelets, chemistries with low sodium low potassium low bicarb low magnesium , troponins flat, lactate elevated, urinalysis without signs of infection I ordered medication IV fluids IV and oral electrolytes and reviewed PMP when indicated. I ordered imaging studies which included chest x-ray and I independently    visualized and interpreted imaging which showed no acute findings Previous records obtained and reviewed in epic including recent GI and cardiology notes I consulted [facility] hospitalist Dr. Lawence and discussed lab and imaging findings and discussed disposition.  Cardiac monitoring reviewed, atrial fibrillation with controlled rate Social determinants considered, no significant barriers Critical Interventions: None  After the interventions stated above, I reevaluated the patient and found patient's symptoms to be improving although still feels very weak Admission and further testing considered, due to his prior cardiac condition and electrolyte disturbance feel he would benefit from mission to the hospital for continued repletion and monitoring.  He is in  agreement with plan for admission.      Final diagnoses:  Syncope and collapse  Hypokalemia  Hypomagnesemia  Nausea and vomiting, unspecified vomiting type    ED Discharge Orders     None          Towana Ozell BROCKS, MD 04/09/24 1039

## 2024-04-08 NOTE — ED Triage Notes (Addendum)
 [facility] from home. Cc of emesis,  hypotension, and LOC that started today. Denies sick contacts. When asked for number of episodes of emesis a lot  When ems arrived patient was covered in vomit. 88% on room air. And bp was in the 80s systolic.  18g left forearm placed bolus started.  Refused zofran  with ems.  77/63 upon arrival.  History of etoh abuse. Admits to 5 pints of beer today. And a little marijuana.

## 2024-04-08 NOTE — H&P (Addendum)
 [place]   PATIENT NAME: Miguel Spencer    MR#:  [id number]  DATE OF BIRTH:  July 21, 1951  DATE OF ADMISSION:  04/08/2024  PRIMARY CARE PHYSICIAN: [name], MD   Patient is coming from: Home.  REQUESTING/REFERRING PHYSICIAN: Towana Sharper, MD  CHIEF COMPLAINT:   Chief Complaint  Patient presents with   Loss of Consciousness   Emesis    HISTORY OF PRESENT ILLNESS:  Miguel Spencer is a 73 y.o. Caucasian male with medical history significant for anxiety, depression, GERD, essential hypertension, gout, atrial fibrillation on Eliquis  and alcohol abuse, who presented to the emergency room with onset of syncope and intractable nausea and vomiting today without diarrhea or normal blood sugar per rectum.  He denies any abdominal pain.  No fevers or chills.  He admitted to dyspnea without wheezing or cough.  No dysuria, chronic or hematuria, urgency or frequency or flank pain.  He denies any chest pain or palpitations.  No headache or dizziness or blurred vision.  No bleeding diathesis with  ED Course: When the patient came to the ER, BP was 77/65 and later 119/86 with otherwise normal vital signs.  Heart rate later on was 118.  Labs revealed Hypokalemia of 2.6 and hypochloremia of 94 with hyponatremia of 127, CO2 30 calcium 7 point magnesium  1.6 albumin 3.2 and total protein 5.8.  Lactic acid was 2.9 and 2.6.  CBC showed mild thrombocytopenia of 134.  PT was 15.8 with INR 1.2.  UA came back negative. EKG as reviewed by me : EKG showed atrial fibrillation with controlled ventricular response of 87 with borderline low proximal QRS.  Borderline prolonged QT interval with QTc 500 MS. Imaging: Chest x-ray showed no acute cardiopulmonary disease with aortic atherosclerosis.  The patient was given 500 mL IV normal saline bolus twice, 40 mEq p.o. potassium chloride  and 40 mg IV Protonix  as well as 2 g IV magnesium  sulfate.  The patient will be admitted to an observation bed for further  evaluation and management. PAST MEDICAL HISTORY:   Past Medical History:  Diagnosis Date   A-fib (HCC)    Alcohol abuse    Alcoholic (HCC)    Anxiety    Colon polyp    Depression    GERD (gastroesophageal reflux disease)    GI bleed    Gout    Hypertension    Vertigo     PAST SURGICAL HISTORY:   Past Surgical History:  Procedure Laterality Date   ADENOIDECTOMY     BIOPSY  02/26/2019   Procedure: BIOPSY;  Surgeon: Harvey Margo CROME, MD;  Location: [facility];  Service: Endoscopy;;  Gastric    BIOPSY  03/05/2019   Procedure: BIOPSY;  Surgeon: Shaaron Lamar HERO, MD;  Location: [facility];  Service: Endoscopy;;  ascending colon bx   COLONOSCOPY  07/2018   [place]  piecemeal fashion status post tattooing, 30 x 18 mm sized sigmoid colon polyp removed via snare status post tattooing.  Recommended repeat colonoscopy in 3 months.  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   COLONOSCOPY WITH PROPOFOL  N/A 03/05/2019   Procedure: COLONOSCOPY WITH PROPOFOL ;  Surgeon: Shaaron Lamar HERO, MD;  Location: [facility];  Service: Endoscopy;  Laterality: N/A;   ESOPHAGOGASTRODUODENOSCOPY (EGD) WITH PROPOFOL  N/A 02/26/2019   Dr. harvey: LA grade D esophagitis, gastritis with benign biopsies, no H. pylori, 5 nonbleeding superficial duodenal  ulcers.  Felt to be NSAID related.   HERNIA REPAIR     inguinal-not sure which side   POLYPECTOMY  03/05/2019   Procedure: POLYPECTOMY;  Surgeon: Shaaron Lamar HERO, MD;  Location: [facility];  Service: Endoscopy;;  cold snare polypectomy   TONSILLECTOMY     UMBILICAL HERNIA REPAIR N/A 07/06/2020   Procedure: UMBILICAL HERNIA REPAIR WITH MESH;  Surgeon: Kallie Manuelita BROCKS, MD;  Location: [facility];  Service: General;  Laterality: N/A;    SOCIAL HISTORY:   Social History   Tobacco Use   Smoking status: Never   Smokeless tobacco: Never   Substance Use Topics   Alcohol use: Not Currently    Alcohol/week: 70.0 standard drinks of alcohol    Types: 70 Cans of beer per week    Comment: 10 beers daily. Quit 02/02/23    FAMILY HISTORY:   Family History  Problem Relation Age of Onset   Cancer Mother    Heart disease Father    Heart disease Brother    Heart disease Other    Colon cancer Neg Hx    Gastric cancer Neg Hx    Esophageal cancer Neg Hx     DRUG ALLERGIES:  No Known Allergies  REVIEW OF SYSTEMS:   ROS As per history of present illness. All pertinent systems were reviewed above. Constitutional, HEENT, cardiovascular, respiratory, GI, GU, musculoskeletal, neuro, psychiatric, endocrine, integumentary and hematologic systems were reviewed and are otherwise negative/unremarkable except for positive findings mentioned above in the HPI.   MEDICATIONS AT HOME:   Prior to Admission medications   Medication Sig Start Date End Date Taking? Authorizing Provider  apixaban  (ELIQUIS ) 5 MG TABS tablet Take 1 tablet (5 mg total) by mouth 2 (two) times daily. 04/05/19  Yes BranchDorn FALCON, MD  diltiazem  (CARDIZEM  CD) 180 MG 24 hr capsule Take 180 mg by mouth daily.   Yes [provider]  LORazepam  (ATIVAN ) 1 MG tablet Take 1 mg by mouth daily. 01/07/20  Yes [provider]  Multiple Vitamin (MULTIVITAMIN) tablet Take 1 tablet by mouth daily.   Yes [provider]  nebivolol  (BYSTOLIC ) 5 MG tablet Take 5 mg by mouth daily. 02/25/23  Yes [provider]  pantoprazole  (PROTONIX ) 40 MG tablet Take 40 mg by mouth daily.   Yes [provider]  sildenafil  (VIAGRA ) 100 MG tablet Take 1 tablet (100 mg total) by mouth as needed for erectile dysfunction. Patient taking differently: Take 100 mg by mouth daily as needed for erectile dysfunction. 07/11/22 04/08/24 Yes Nieves Cough, MD  valACYclovir (VALTREX) 1000 MG tablet Take 1,000 mg by mouth daily as needed (fever blisters). 02/24/24  Yes  [provider]      VITAL SIGNS:  Blood pressure (!) 146/100, pulse 70, temperature (!) 97.4 F (36.3 C), temperature source Oral, resp. rate 19, height 5' 11 (1.803 m), weight 109 kg, SpO2 98%.  PHYSICAL EXAMINATION:  Physical Exam  GENERAL:  73 y.o.-year-old Caucasian male patient lying in the bed with no acute distress.  EYES: Pupils equal, round, reactive to light and accommodation. No scleral icterus. Extraocular muscles intact.  HEENT: Head atraumatic, normocephalic. Oropharynx and nasopharynx clear.  NECK:  Supple, no jugular venous distention. No thyroid enlargement, no tenderness.  LUNGS: Normal breath sounds bilaterally, no wheezing, rales, rhonchi or crepitation. No use of accessory muscles of respiration.  CARDIOVASCULAR: Regular rate and rhythm, S1, S2 normal. No murmurs, rubs, or gallops.  ABDOMEN: Soft, nondistended, nontender. Bowel sounds present. No  organomegaly or mass.  EXTREMITIES: No pedal edema, cyanosis, or clubbing.  NEUROLOGIC: Cranial nerves II through XII are intact. Muscle strength 5/5 in all extremities. Sensation intact. Gait not checked.  PSYCHIATRIC: The patient is alert and oriented x 3.  Normal affect and good eye contact. SKIN: No obvious rash, lesion, or ulcer.   LABORATORY PANEL:   CBC Recent Labs  Lab 04/08/24 1951  WBC 6.4  HGB 13.0  HCT 36.6*  PLT 134*   ------------------------------------------------------------------------------------------------------------------  Chemistries  Recent Labs  Lab 04/08/24 1951  NA 127*  K 2.6*  CL 94*  CO2 18*  GLUCOSE 130*  BUN 14  CREATININE 1.06  CALCIUM 7.9*  MG 1.6*  AST 26  ALT 25  ALKPHOS 48  BILITOT 0.5   ------------------------------------------------------------------------------------------------------------------  Cardiac Enzymes No results for input(s): TROPONINI in the last 168  hours. ------------------------------------------------------------------------------------------------------------------  RADIOLOGY:  DG Chest Port 1 View Result Date: 04/08/2024 CLINICAL DATA:  Sob emesis, hypotension, and LOC that started today. Denies sick contacts. When asked for number of episodes of emesis a lot History of etoh abuse. Admits to 5 pints of beer today. And a little marijuana EXAM: PORTABLE CHEST 1 VIEW COMPARISON:  Chest x-ray 08/13/2018, CT chest 01/19/2013 FINDINGS: Patient is rotated. The heart and mediastinal contours are unchanged given patient positioning. Atherosclerotic plaque. No focal consolidation. No pulmonary edema. No pleural effusion. No pneumothorax. No acute osseous abnormality. IMPRESSION: 1. No active disease. 2.  Aortic Atherosclerosis (ICD10-I70.0). Electronically Signed   By: Morgane  Naveau M.D.   On: 04/08/2024 20:15      IMPRESSION AND PLAN:  Assessment and Plan: * Syncope and collapse - The patient will be admitted to an observation medically monitored bed. - Will check orthostatics q 12 hours. - The patient will be gently hydrated with IV normal saline and monitored for arrhythmias. -Differential diagnoses would include neurally mediated syncope especially given associated vomiting, cardiogenic, arrhythmias related,  orthostatic hypotension and less likely hypoglycemia.    Intractable nausea and vomiting - The patient will be admitted to a medical observation bed. - The patient be hydrated with IV normal saline with potassium chloride . - As needed antiemetics will be provided. - This could be related to acute gastritis probably viral. - IV PPI therapy will be provided.  Atrial fibrillation, chronic (HCC) - Continue Cardizem  CD and Bystolic  as well as Eliquis .  Anxiety - Will continue Ativan .  GERD without esophagitis - Will continue PPI therapy   DVT prophylaxis: Eliquis . Advanced Care Planning:  Code Status: full code.  Family  Communication:  The plan of care was discussed in details with the patient (and family). I answered all questions. The patient agreed to proceed with the above mentioned plan. Further management will depend upon hospital course. Disposition Plan: Back to previous home environment Consults called: none.  All the records are reviewed and case discussed with ED provider.  Status is: Observation  I certify that at the time of admission, it is my clinical judgment that the patient will require hospital care extending less than 2 midnights.                            Dispo: The patient is from: Home              Anticipated d/c is to: Home              Patient currently is not medically stable to d/c.  Difficult to place patient: No  Madison DELENA Peaches M.D on 04/09/2024 at 1:51 AM  [facility]   From 7 PM-7 AM, contact night-coverage [email or web address]  CC: Primary care physician; [name], MD

## 2024-04-09 DIAGNOSIS — I482 Chronic atrial fibrillation, unspecified: Secondary | ICD-10-CM

## 2024-04-09 DIAGNOSIS — R112 Nausea with vomiting, unspecified: Secondary | ICD-10-CM | POA: Diagnosis not present

## 2024-04-09 DIAGNOSIS — R55 Syncope and collapse: Secondary | ICD-10-CM | POA: Diagnosis present

## 2024-04-09 DIAGNOSIS — K219 Gastro-esophageal reflux disease without esophagitis: Secondary | ICD-10-CM | POA: Diagnosis present

## 2024-04-09 DIAGNOSIS — Z809 Family history of malignant neoplasm, unspecified: Secondary | ICD-10-CM | POA: Diagnosis not present

## 2024-04-09 DIAGNOSIS — I10 Essential (primary) hypertension: Secondary | ICD-10-CM | POA: Diagnosis present

## 2024-04-09 DIAGNOSIS — E876 Hypokalemia: Secondary | ICD-10-CM | POA: Diagnosis present

## 2024-04-09 DIAGNOSIS — D6959 Other secondary thrombocytopenia: Secondary | ICD-10-CM | POA: Diagnosis present

## 2024-04-09 DIAGNOSIS — F10129 Alcohol abuse with intoxication, unspecified: Secondary | ICD-10-CM | POA: Diagnosis present

## 2024-04-09 DIAGNOSIS — F419 Anxiety disorder, unspecified: Secondary | ICD-10-CM | POA: Diagnosis present

## 2024-04-09 DIAGNOSIS — Z860101 Personal history of adenomatous and serrated colon polyps: Secondary | ICD-10-CM | POA: Diagnosis not present

## 2024-04-09 DIAGNOSIS — F1010 Alcohol abuse, uncomplicated: Secondary | ICD-10-CM

## 2024-04-09 DIAGNOSIS — Z7901 Long term (current) use of anticoagulants: Secondary | ICD-10-CM | POA: Diagnosis not present

## 2024-04-09 DIAGNOSIS — E871 Hypo-osmolality and hyponatremia: Secondary | ICD-10-CM | POA: Diagnosis present

## 2024-04-09 DIAGNOSIS — E878 Other disorders of electrolyte and fluid balance, not elsewhere classified: Secondary | ICD-10-CM | POA: Diagnosis present

## 2024-04-09 DIAGNOSIS — Z8249 Family history of ischemic heart disease and other diseases of the circulatory system: Secondary | ICD-10-CM | POA: Diagnosis not present

## 2024-04-09 DIAGNOSIS — Z79899 Other long term (current) drug therapy: Secondary | ICD-10-CM | POA: Diagnosis not present

## 2024-04-09 LAB — BASIC METABOLIC PANEL WITH GFR
Anion gap: 7 (ref 5–15)
BUN: 11 mg/dL (ref 8–23)
CO2: 20 mmol/L — ABNORMAL LOW (ref 22–32)
Calcium: 8 mg/dL — ABNORMAL LOW (ref 8.9–10.3)
Chloride: 102 mmol/L (ref 98–111)
Creatinine, Ser: 0.98 mg/dL (ref 0.61–1.24)
GFR, Estimated: >60 mL/min (ref >60)
Glucose, Bld: 124 mg/dL — ABNORMAL HIGH (ref 70–99)
Potassium: 4.2 mmol/L (ref 3.5–5.1)
Sodium: 129 mmol/L — ABNORMAL LOW (ref 135–145)

## 2024-04-09 LAB — RAPID URINE DRUG SCREEN, HOSP PERFORMED
Amphetamines: NOT DETECTED
Barbiturates: NOT DETECTED
Benzodiazepines: NOT DETECTED
Cocaine: NOT DETECTED
Opiates: NOT DETECTED
Tetrahydrocannabinol: POSITIVE — AB

## 2024-04-09 LAB — CBC
HCT: 41 % (ref 39.0–52.0)
Hemoglobin: 14.3 g/dL (ref 13.0–17.0)
MCH: 32.9 pg (ref 26.0–34.0)
MCHC: 34.9 g/dL (ref 30.0–36.0)
MCV: 94.5 fL (ref 80.0–100.0)
Platelets: 143 K/uL — ABNORMAL LOW (ref 150–400)
RBC: 4.34 MIL/uL (ref 4.22–5.81)
RDW: 13.7 % (ref 11.5–15.5)
WBC: 10.3 K/uL (ref 4.0–10.5)
nRBC: 0 % (ref 0.0–0.2)

## 2024-04-09 MED ORDER — THIAMINE MONONITRATE 100 MG PO TABS
100.0000 mg | ORAL_TABLET | Freq: Every day | ORAL | Status: DC
  Administered 2024-04-09 – 2024-04-10 (×2): 100 mg via ORAL
  Filled 2024-04-09 (×2): qty 1

## 2024-04-09 MED ORDER — METOCLOPRAMIDE HCL 5 MG/ML IJ SOLN: 10.0000 mg | Freq: Four times a day (QID) | INTRAMUSCULAR | Status: DC | PRN

## 2024-04-09 MED ORDER — CHLORDIAZEPOXIDE HCL 5 MG PO CAPS: 5.0000 mg | ORAL_CAPSULE | Freq: Three times a day (TID) | ORAL | Status: DC

## 2024-04-09 MED ORDER — THIAMINE HCL 100 MG/ML IJ SOLN: 100.0000 mg | Freq: Every day | INTRAMUSCULAR | Status: DC

## 2024-04-09 MED ORDER — POTASSIUM CHLORIDE IN NACL 20-0.9 MEQ/L-% IV SOLN: INTRAVENOUS | Status: AC

## 2024-04-09 MED ORDER — LORAZEPAM 1 MG PO TABS: 1.0000 mg | ORAL_TABLET | ORAL | Status: DC | PRN

## 2024-04-09 MED ORDER — CHLORDIAZEPOXIDE HCL 5 MG PO CAPS
25.0000 mg | ORAL_CAPSULE | Freq: Three times a day (TID) | ORAL | Status: AC
  Administered 2024-04-09 (×3): 25 mg via ORAL
  Filled 2024-04-09 (×3): qty 5

## 2024-04-09 MED ORDER — CHLORDIAZEPOXIDE HCL 5 MG PO CAPS: 10.0000 mg | ORAL_CAPSULE | Freq: Three times a day (TID) | ORAL | Status: DC

## 2024-04-09 MED ORDER — FOLIC ACID 1 MG PO TABS
1.0000 mg | ORAL_TABLET | Freq: Every day | ORAL | Status: DC
  Administered 2024-04-09 – 2024-04-10 (×2): 1 mg via ORAL
  Filled 2024-04-09 (×2): qty 1

## 2024-04-09 MED ORDER — PROCHLORPERAZINE EDISYLATE 10 MG/2ML IJ SOLN: 10.0000 mg | INTRAMUSCULAR | Status: DC | PRN

## 2024-04-09 MED ORDER — ADULT MULTIVITAMIN W/MINERALS CH
1.0000 | ORAL_TABLET | Freq: Every day | ORAL | Status: DC
  Administered 2024-04-09 – 2024-04-10 (×2): 1 via ORAL
  Filled 2024-04-09 (×2): qty 1

## 2024-04-09 NOTE — Assessment & Plan Note (Signed)
-   Continue Cardizem  CD and Bystolic  as well as Eliquis .

## 2024-04-09 NOTE — Progress Notes (Signed)
   04/09/24 0950  TOC Brief Assessment  Insurance and Status Reviewed  Patient has primary care physician Yes  Home environment has been reviewed From home c/wife  Prior level of function: Independent  Prior/Current Home Services No current home services  Social Drivers of Health Review SDOH reviewed no interventions necessary  Readmission risk has been reviewed Yes  Transition of care needs no transition of care needs at this time   Transition of Care Department [facility]) has reviewed patient and no TOC needs have been identified at this time. We will continue to monitor patient advancement through interdisciplinary progression rounds. If new patient transition needs arise, please place a TOC consult.

## 2024-04-09 NOTE — Care Management Obs Status (Signed)
 MEDICARE OBSERVATION STATUS NOTIFICATION   Patient Details  Name: Miguel Spencer MRN: [id number] Date of Birth: [birth year]   Medicare Observation Status Notification Given:  Yes    Nena LITTIE Coffee, RN 04/09/2024, 9:28 AM

## 2024-04-09 NOTE — Assessment & Plan Note (Signed)
 Will continue PPI therapy.

## 2024-04-09 NOTE — Assessment & Plan Note (Signed)
-   The patient will be admitted to a medical observation bed. - The patient be hydrated with IV normal saline with potassium chloride . - As needed antiemetics will be provided. - This could be related to acute gastritis probably viral. - IV PPI therapy will be provided.

## 2024-04-09 NOTE — Hospital Course (Signed)
 73 y.o. Caucasian male with medical history significant for anxiety, depression, GERD, essential hypertension, gout, atrial fibrillation on Eliquis  and alcohol abuse, who presented to the emergency room with onset of syncope and intractable nausea and vomiting today without diarrhea or normal blood sugar per rectum.  He denies any abdominal pain.  No fevers or chills.  He admitted to dyspnea without wheezing or cough.  No dysuria, chronic or hematuria, urgency or frequency or flank pain.  He denies any chest pain or palpitations.  No headache or dizziness or blurred vision.  No bleeding diathesis.   ED Course: When the patient came to the ER, BP was 77/65 and later 119/86 with otherwise normal vital signs.  Heart rate later on was 118.  Labs revealed Hypokalemia of 2.6 and hypochloremia of 94 with hyponatremia of 127, CO2 30 calcium 7 point magnesium  1.6 albumin 3.2 and total protein 5.8.  Lactic acid was 2.9 and 2.6.  CBC showed mild thrombocytopenia of 134.  PT was 15.8 with INR 1.2.  UA came back negative.   EKG as reviewed by me : EKG showed atrial fibrillation with controlled ventricular response of 87 with borderline low proximal QRS.  Borderline prolonged QT interval with QTc 500 MS. Imaging: Chest x-ray showed no acute cardiopulmonary disease with aortic atherosclerosis.   The patient was given 500 mL IV normal saline bolus twice, 40 mEq p.o. potassium chloride  and 40 mg IV Protonix  as well as 2 g IV magnesium  sulfate.  The patient will be admitted to an observation bed for further evaluation and management.

## 2024-04-09 NOTE — Plan of Care (Signed)
   Problem: Education: Goal: Knowledge of General Education information will improve Description: Including pain rating scale, medication(s)/side effects and non-pharmacologic comfort measures Outcome: Progressing   Problem: Activity: Goal: Risk for activity intolerance will decrease Outcome: Progressing   Problem: Nutrition: Goal: Adequate nutrition will be maintained Outcome: Progressing   Problem: Coping: Goal: Level of anxiety will decrease Outcome: Progressing

## 2024-04-09 NOTE — Progress Notes (Addendum)
 PROGRESS NOTE   Miguel Spencer  FMW:[id number] DOB: 04/30/1951 DOA: 04/08/2024 PCP: [name], MD   Chief Complaint  Patient presents with   Loss of Consciousness   Emesis   Level of care: Telemetry  Brief Admission History:  73 y.o. Caucasian male with medical history significant for anxiety, depression, GERD, essential hypertension, gout, atrial fibrillation on Eliquis  and alcohol abuse, who presented to the emergency room with onset of syncope and intractable nausea and vomiting today without diarrhea or normal blood sugar per rectum.  He denies any abdominal pain.  No fevers or chills.  He admitted to dyspnea without wheezing or cough.  No dysuria, chronic or hematuria, urgency or frequency or flank pain.  He denies any chest pain or palpitations.  No headache or dizziness or blurred vision.  No bleeding diathesis.   ED Course: When the patient came to the ER, BP was 77/65 and later 119/86 with otherwise normal vital signs.  Heart rate later on was 118.  Labs revealed Hypokalemia of 2.6 and hypochloremia of 94 with hyponatremia of 127, CO2 30 calcium 7 point magnesium  1.6 albumin 3.2 and total protein 5.8.  Lactic acid was 2.9 and 2.6.  CBC showed mild thrombocytopenia of 134.  PT was 15.8 with INR 1.2.  UA came back negative.   EKG as reviewed by me : EKG showed atrial fibrillation with controlled ventricular response of 87 with borderline low proximal QRS.  Borderline prolonged QT interval with QTc 500 MS. Imaging: Chest x-ray showed no acute cardiopulmonary disease with aortic atherosclerosis.   The patient was given 500 mL IV normal saline bolus twice, 40 mEq p.o. potassium chloride  and 40 mg IV Protonix  as well as 2 g IV magnesium  sulfate.  The patient will be admitted to an observation bed for further evaluation and management.   Assessment and Plan:  Syncope and collapse - The patient will be admitted to an observation medically monitored bed. - The patient will be gently  hydrated with IVF -- possibly from alcohol intoxication  GERD without esophagitis - Will continue PPI therapy  Atrial fibrillation, chronic  - Continue Cardizem  CD and Bystolic  as well as Eliquis .  Intractable nausea and vomiting - possibly related to THC/CBD edibles that he had been taking  - The patient be hydrated with IVFs - As needed antiemetics will be provided. - This could be related to acute gastritis probably viral. - IV PPI therapy will be provided. 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   Objective: Vitals:   04/08/24 2300 04/08/24 2330 04/08/24 2346 04/09/24 0313  BP: 119/86  (!) 146/100 118/89  Pulse:  90 70 94  Resp:  16 19 18   Temp:   (!) 97.4 F (36.3 C) 97.9 F (36.6 C)  TempSrc:   Oral   SpO2:  94% 98% 96%  Weight:  106.4 kg  Height:    6' (1.829 m)    Intake/Output Summary (Last 24 hours) at 04/09/2024 1302 Last data filed at 04/09/2024 1047 Gross per 24 hour  Intake 1629.9 ml  Output --  Net 1629.9 ml   Filed Weights   04/08/24 1932 04/09/24 0313  Weight: 109 kg 106.4 kg   Examination:  General exam: Appears calm and comfortable  Respiratory system: Clear to auscultation. Respiratory effort normal. Cardiovascular system: normal S1 & S2 heard. No JVD, murmurs, rubs, gallops or clicks. No pedal edema. Gastrointestinal system: Abdomen is nondistended, soft and  nontender. No organomegaly or masses felt. Normal bowel sounds heard. Central nervous system: Alert and oriented. No focal neurological deficits. Extremities: Symmetric 5 x 5 power. Skin: No rashes, lesions or ulcers. Psychiatry: Judgement and insight appear normal. Mood & affect appropriate.   Data Reviewed: I have personally reviewed following labs and imaging studies  CBC: Recent Labs  Lab 04/08/24 1951 04/09/24 0434  WBC 6.4 10.3  HGB 13.0 14.3  HCT 36.6* 41.0  MCV 93.8 94.5  PLT 134* 143*    Basic Metabolic Panel: Recent Labs  Lab 04/08/24 1951 04/09/24 0434  NA 127* 129*  K 2.6* 4.2  CL 94* 102  CO2 18* 20*  GLUCOSE 130* 124*  BUN 14 11  CREATININE 1.06 0.98  CALCIUM 7.9* 8.0*  MG 1.6*  --     CBG: No results for input(s): GLUCAP in the last 168 hours.  No results found for this or any previous visit (from the past 240 hours).   Radiology Studies: DG Chest Port 1 View Result Date: 04/08/2024 CLINICAL DATA:  Sob emesis, hypotension, and LOC that started today. Denies sick contacts. When asked for number of episodes of emesis a lot History of etoh abuse. Admits to 5 pints of beer today. And a little marijuana EXAM: PORTABLE CHEST 1 VIEW COMPARISON:  Chest x-ray 08/13/2018, CT chest 01/19/2013 FINDINGS: Patient is rotated. The heart and mediastinal contours are unchanged given patient positioning. Atherosclerotic plaque. No focal consolidation. No pulmonary edema. No pleural effusion. No pneumothorax. No acute osseous abnormality. IMPRESSION: 1. No active disease. 2.  Aortic Atherosclerosis (ICD10-I70.0). Electronically Signed   By: Morgane  Naveau M.D.   On: 04/08/2024 20:15    Scheduled Meds:  apixaban   5 mg Oral BID   chlordiazePOXIDE   25 mg Oral TID   Followed by   NOREEN ON 04/10/2024] chlordiazePOXIDE   10 mg Oral TID   Followed by   NOREEN ON 04/11/2024] chlordiazePOXIDE   5 mg Oral TID   diltiazem   180 mg Oral Daily   folic acid   1 mg Oral Daily    multivitamin with minerals  1 tablet Oral Daily   nebivolol   5 mg Oral Daily   pantoprazole  (PROTONIX ) IV  40 mg Intravenous Q12H   thiamine   100 mg Oral Daily   Or   thiamine   100 mg Intravenous Daily   Continuous Infusions:  0.9 % NaCl with KCl 20 mEq / L 100 mL/hr at 04/09/24 1100     LOS: 0 days   Time spent: 55 mins  Noris Kulinski Vicci, MD How to contact the [facility] Attending or Consulting provider 7A - 7P or covering provider during after hours 7P -7A, for this patient?  Check the care team in [facility] and look for a) attending/consulting [facility] provider listed and b) the [facility] team listed Log into [email or web address] to find provider on call.  Locate the [facility] provider you are looking for under  [facility] and page to a number that you can be directly reached. If you still have difficulty reaching the provider, please page the [facility] (Director on Call) for the Hospitalists listed on amion for assistance.  04/09/2024, 1:02 PM

## 2024-04-09 NOTE — Discharge Instructions (Addendum)
 [facility] (outpatient services) [phone] [place], [place] [address]   IMPORTANT INFORMATION: PAY CLOSE ATTENTION   PHYSICIAN DISCHARGE INSTRUCTIONS  Follow with Primary care provider  [name], MD  and other consultants as instructed by your Hospitalist Physician  SEEK MEDICAL CARE OR RETURN TO EMERGENCY ROOM IF SYMPTOMS COME BACK, WORSEN OR NEW PROBLEM DEVELOPS   Please note: You were cared for by a hospitalist during your hospital stay. Every effort will be made to forward records to your primary care provider.  You can request that your primary care provider send for your hospital records if they have not received them.  Once you are discharged, your primary care physician will handle any further medical issues. Please note that NO REFILLS for any discharge medications will be authorized once you are discharged, as it is imperative that you return to your primary care physician (or establish a relationship with a primary care physician if you do not have one) for your post hospital discharge needs so that they can reassess your need for medications and monitor your lab values.  Please get a complete blood count and chemistry panel checked by your Primary MD at your next visit, and again as instructed by your Primary MD.  Get Medicines reviewed and adjusted: Please take all your medications with you for your next visit with your Primary MD  Laboratory/radiological data: Please request your Primary MD to go over all hospital tests and procedure/radiological results at the follow up, please ask your primary care provider to get all Hospital records sent to his/her office.  In some cases, they will be blood work, cultures and biopsy results pending at the time of your discharge. Please request that your primary care provider follow up on these results.  If you are diabetic, please bring your blood sugar readings with you to your  follow up appointment with primary care.    Please call and make your follow up appointments as soon as possible.    Also Note the following: If you experience worsening of your admission symptoms, develop shortness of breath, life threatening emergency, suicidal or homicidal thoughts you must seek medical attention immediately by calling 911 or calling your MD immediately  if symptoms less severe.  You must read complete instructions/literature along with all the possible adverse reactions/side effects for all the Medicines you take and that have been prescribed to you. Take any new Medicines after you have completely understood and accpet all the possible adverse reactions/side effects.   Do not drive when taking Pain medications or sleeping medications (Benzodiazepines)  Do not take more than prescribed Pain, Sleep and Anxiety Medications. It is not advisable to combine anxiety,sleep and pain medications without talking with your primary care practitioner  Special Instructions: If you have smoked or chewed Tobacco  in the last 2 yrs please stop smoking, stop any regular Alcohol  and or any Recreational drug use.  Wear Seat belts while driving.  Do not drive if taking any narcotic, mind altering or controlled substances or recreational drugs or alcohol.

## 2024-04-09 NOTE — Assessment & Plan Note (Signed)
-   The patient will be admitted to an observation medically monitored bed. - Will check orthostatics q 12 hours. - The patient will be gently hydrated with IV normal saline and monitored for arrhythmias. -Differential diagnoses would include neurally mediated syncope especially given associated vomiting, cardiogenic, arrhythmias related,  orthostatic hypotension and less likely hypoglycemia.

## 2024-04-09 NOTE — Plan of Care (Signed)
  Problem: Education: Goal: Knowledge of General Education information will improve Description: Including pain rating scale, medication(s)/side effects and non-pharmacologic comfort measures Outcome: Progressing   Problem: Clinical Measurements: Goal: Ability to maintain clinical measurements within normal limits will improve Outcome: Progressing Goal: Will remain free from infection Outcome: Progressing Goal: Diagnostic test results will improve Outcome: Progressing Goal: Respiratory complications will improve Outcome: Progressing Goal: Cardiovascular complication will be avoided Outcome: Progressing   Problem: Nutrition: Goal: Adequate nutrition will be maintained Outcome: Progressing   Problem: Coping: Goal: Level of anxiety will decrease Outcome: Progressing   Problem: Elimination: Goal: Will not experience complications related to bowel motility Outcome: Progressing Goal: Will not experience complications related to urinary retention Outcome: Progressing   Problem: Safety: Goal: Ability to remain free from injury will improve Outcome: Progressing

## 2024-04-09 NOTE — Assessment & Plan Note (Signed)
-   Will continue Ativan

## 2024-04-10 DIAGNOSIS — K219 Gastro-esophageal reflux disease without esophagitis: Secondary | ICD-10-CM

## 2024-04-10 DIAGNOSIS — I482 Chronic atrial fibrillation, unspecified: Secondary | ICD-10-CM | POA: Diagnosis not present

## 2024-04-10 DIAGNOSIS — R55 Syncope and collapse: Secondary | ICD-10-CM | POA: Diagnosis not present

## 2024-04-10 DIAGNOSIS — E876 Hypokalemia: Secondary | ICD-10-CM

## 2024-04-10 LAB — BASIC METABOLIC PANEL WITH GFR
Anion gap: 3 — ABNORMAL LOW (ref 5–15)
BUN: 8 mg/dL (ref 8–23)
CO2: 22 mmol/L (ref 22–32)
Calcium: 7.9 mg/dL — ABNORMAL LOW (ref 8.9–10.3)
Chloride: 107 mmol/L (ref 98–111)
Creatinine, Ser: 0.89 mg/dL (ref 0.61–1.24)
GFR, Estimated: >60 mL/min (ref >60)
Glucose, Bld: 103 mg/dL — ABNORMAL HIGH (ref 70–99)
Potassium: 4 mmol/L (ref 3.5–5.1)
Sodium: 132 mmol/L — ABNORMAL LOW (ref 135–145)

## 2024-04-10 LAB — MAGNESIUM: Magnesium: 1.9 mg/dL (ref 1.7–2.4)

## 2024-04-10 MED ORDER — VITAMIN B-1 100 MG PO TABS: 100.0000 mg | ORAL_TABLET | Freq: Every day | ORAL | 1 refills | Status: AC

## 2024-04-10 MED ORDER — FOLIC ACID 1 MG PO TABS
1.0000 mg | ORAL_TABLET | Freq: Every day | ORAL | 1 refills | Status: AC
Start: 2024-04-11 — End: ?

## 2024-04-10 NOTE — Plan of Care (Signed)

## 2024-04-10 NOTE — Plan of Care (Signed)
  Problem: Health Behavior/Discharge Planning: Goal: Ability to manage health-related needs will improve Outcome: Progressing   Problem: Clinical Measurements: Goal: Ability to maintain clinical measurements within normal limits will improve Outcome: Progressing Goal: Will remain free from infection Outcome: Progressing   Problem: Activity: Goal: Risk for activity intolerance will decrease Outcome: Progressing   Problem: Coping: Goal: Level of anxiety will decrease Outcome: Progressing   Problem: Elimination: Goal: Will not experience complications related to bowel motility Outcome: Progressing Goal: Will not experience complications related to urinary retention Outcome: Progressing   Problem: Pain Managment: Goal: General experience of comfort will improve and/or be controlled Outcome: Progressing   Problem: Safety: Goal: Ability to remain free from injury will improve Outcome: Progressing

## 2024-04-10 NOTE — Discharge Summary (Signed)
 Physician Discharge Summary  Miguel Spencer FMW:[id number] DOB: May 17, 1951 DOA: 04/08/2024  PCP: [name], MD  Admit date: 04/08/2024 Discharge date: 04/10/2024  Admitted From:  HOME  Disposition: HOME   Recommendations for Outpatient Follow-up:  Follow up with PCP in 1 weeks  Home Health: NA  Discharge Condition: STABLE   CODE STATUS: FULL DIET: soft foods, advance as tolerated to regular   Brief Hospitalization Summary: Please see all hospital notes, images, labs for full details of the hospitalization. Admission provider HPI:  73 y.o. Caucasian male with medical history significant for anxiety, depression, GERD, essential hypertension, gout, atrial fibrillation on Eliquis  and alcohol abuse, who presented to the emergency room with onset of syncope and intractable nausea and vomiting today without diarrhea or normal blood sugar per rectum.  He denies any abdominal pain.  No fevers or chills.  He admitted to dyspnea without wheezing or cough.  No dysuria, chronic or hematuria, urgency or frequency or flank pain.  He denies any chest pain or palpitations.  No headache or dizziness or blurred vision.  No bleeding diathesis.   ED Course: When the patient came to the ER, BP was 77/65 and later 119/86 with otherwise normal vital signs.  Heart rate later on was 118.  Labs revealed Hypokalemia of 2.6 and hypochloremia of 94 with hyponatremia of 127, CO2 30 calcium 7 point magnesium  1.6 albumin 3.2 and total protein 5.8.  Lactic acid was 2.9 and 2.6.  CBC showed mild thrombocytopenia of 134.  PT was 15.8 with INR 1.2.  UA came back negative.   EKG as reviewed by me : EKG showed atrial fibrillation with controlled ventricular response of 87 with borderline low proximal QRS.  Borderline prolonged QT interval with QTc 500 MS. Imaging: Chest x-ray showed no acute cardiopulmonary disease with aortic atherosclerosis.   The patient was given 500 mL IV normal saline bolus twice, 40 mEq p.o. potassium  chloride and 40 mg IV Protonix  as well as 2 g IV magnesium  sulfate.  The patient will be admitted to an observation bed for further evaluation and management.  Hospital Course by listed problems addressed   Syncope and collapse - The patient was admitted to an observation medically monitored bed. - The patient will be gently hydrated with IVF and he has improved markedly -- likely from alcohol intoxication   GERD without esophagitis - Will continue PPI therapy   Atrial fibrillation, chronic  - Continue Cardizem  CD and Bystolic  as well as Eliquis .   Intractable nausea and vomiting - RESOLVED  - possibly related to THC/CBD edibles that he had been taking  - The was hydrated with IVFs - As needed antiemetics was provided. 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   diltiazem  180 MG 24 hr capsule Commonly known as: CARDIZEM  CD Take 180 mg by mouth daily.   folic acid  1 MG tablet Commonly known as: FOLVITE  Take 1 tablet (1 mg total) by mouth daily. Start taking on: April 11, 2024   LORazepam  1 MG tablet Commonly known as: ATIVAN  Take 1 mg by mouth daily.   multivitamin  tablet Take 1 tablet by mouth daily.   nebivolol  5 MG tablet Commonly known as: BYSTOLIC  Take 5 mg by mouth daily.   pantoprazole  40 MG tablet Commonly known as: PROTONIX  Take 40 mg by mouth daily.   sildenafil  100 MG tablet Commonly known as: Viagra  Take 1 tablet (100 mg total) by mouth as needed for erectile dysfunction. What changed: when to take this   thiamine  100 MG tablet Commonly known as: Vitamin B-1 Take 1 tablet (100 mg total) by mouth daily. Start taking on: April 11, 2024   valACYclovir 1000 MG tablet Commonly known as: VALTREX Take 1,000 mg by mouth daily as needed (fever blisters).        Follow-up Information     [name], MD. Schedule an appointment as soon as possible for a visit in 1 week(s).   Specialty: Internal Medicine Why: Hospital Follow Up Contact information: [address] [place] [facility] [id number] [phone]                No Known Allergies Allergies as of 04/10/2024   No Known Allergies      Medication List     TAKE these medications    apixaban  5 MG Tabs tablet Commonly known as: Eliquis  Take 1 tablet (5 mg total) by mouth 2 (two) times daily.   diltiazem  180 MG 24 hr capsule Commonly known as: CARDIZEM  CD Take 180 mg by mouth daily.   folic acid  1 MG tablet Commonly known as: FOLVITE  Take 1 tablet (1 mg total) by mouth daily. Start taking on: April 11, 2024   LORazepam  1 MG tablet Commonly known as: ATIVAN  Take 1 mg by mouth daily.   multivitamin tablet Take 1 tablet by mouth daily.   nebivolol  5 MG tablet Commonly known as: BYSTOLIC  Take 5 mg by mouth daily.   pantoprazole  40 MG tablet Commonly known as: PROTONIX  Take 40 mg by mouth daily.   sildenafil  100 MG tablet Commonly known as: Viagra  Take 1 tablet (100 mg total) by mouth as needed for erectile dysfunction. What changed: when to take this   thiamine  100 MG tablet Commonly known as: Vitamin B-1 Take 1 tablet (100 mg total) by mouth  daily. Start taking on: April 11, 2024   valACYclovir 1000 MG tablet Commonly known as: VALTREX Take 1,000 mg by mouth daily as needed (fever blisters).        Procedures/Studies: DG Chest Port 1 View Result Date: 04/08/2024 CLINICAL DATA:  Sob emesis, hypotension, and LOC that started today. Denies sick contacts. When asked for number of episodes of emesis a lot History of etoh abuse. Admits to 5 pints of beer today. And a little marijuana EXAM: PORTABLE CHEST 1 VIEW COMPARISON:  Chest x-ray 08/13/2018, CT chest 01/19/2013 FINDINGS: Patient is rotated. The heart and mediastinal contours are unchanged given patient positioning. Atherosclerotic plaque. No focal consolidation. No pulmonary edema. No pleural effusion. No pneumothorax. No acute osseous abnormality. IMPRESSION:  1. No active disease. 2.  Aortic Atherosclerosis (ICD10-I70.0). Electronically Signed   By: Morgane  Naveau M.D.   On: 04/08/2024 20:15     Subjective: Pt tolerating diet well and reports he is feeling a lot better. He is eager to go home. He says he is considering cutting down or stopping the alcohol and THC use.    Discharge Exam: Vitals:   04/09/24 2002 04/10/24 0538  BP: (!) 132/101 (!) 141/97  Pulse: 67 85  Resp: 18 19  Temp: 98.1 F (36.7 C) 97.8 F (36.6 C)  SpO2: 98% 97%   Vitals:   04/09/24 0313 04/09/24 1523 04/09/24 2002 04/10/24 0538  BP: 118/89 129/69 (!) 132/101 (!) 141/97  Pulse: 94 86 67 85  Resp: 18  18 19   Temp: 97.9 F (36.6 C) 98 F (36.7 C) 98.1 F (36.7 C) 97.8 F (36.6 C)  TempSrc:  Oral Oral   SpO2: 96% 97% 98% 97%  Weight: 106.4 kg     Height: 6' (1.829 m)      General: Pt is alert, awake, not in acute distress Cardiovascular: RRR, S1/S2 +, no rubs, no gallops Respiratory: CTA bilaterally, no wheezing, no rhonchi Abdominal: Soft, NT, ND, bowel sounds + Extremities: no edema, no cyanosis   The results of significant diagnostics from this hospitalization (including imaging,  microbiology, ancillary and laboratory) are listed below for reference.     Microbiology: No results found for this or any previous visit (from the past 240 hours).   Labs: BNP (last 3 results) Recent Labs    04/08/24 1951  BNP 175.0*   Basic Metabolic Panel: Recent Labs  Lab 04/08/24 1951 04/09/24 0434 04/10/24 0407  NA 127* 129* 132*  K 2.6* 4.2 4.0  CL 94* 102 107  CO2 18* 20* 22  GLUCOSE 130* 124* 103*  BUN 14 11 8   CREATININE 1.06 0.98 0.89  CALCIUM 7.9* 8.0* 7.9*  MG 1.6*  --  1.9   Liver Function Tests: Recent Labs  Lab 04/08/24 1951  AST 26  ALT 25  ALKPHOS 48  BILITOT 0.5  PROT 5.8*  ALBUMIN 3.2*   Recent Labs  Lab 04/08/24 1951  LIPASE 39   No results for input(s): AMMONIA in the last 168 hours. CBC: Recent Labs  Lab 04/08/24 1951 04/09/24 0434  WBC 6.4 10.3  HGB 13.0 14.3  HCT 36.6* 41.0  MCV 93.8 94.5  PLT 134* 143*   Cardiac Enzymes: No results for input(s): CKTOTAL, CKMB, CKMBINDEX, TROPONINI in the last 168 hours. BNP: Invalid input(s): POCBNP CBG: No results for input(s): GLUCAP in the last 168 hours. D-Dimer No results for input(s): DDIMER in the last 72 hours. Hgb A1c No results for input(s): HGBA1C in the last 72 hours. Lipid Profile No results for input(s): CHOL, HDL, LDLCALC, TRIG, CHOLHDL, LDLDIRECT in the last 72 hours. Thyroid function studies No results for input(s): TSH, T4TOTAL, T3FREE, THYROIDAB in the last 72 hours.  Invalid input(s): FREET3 Anemia work up No results for input(s): VITAMINB12, FOLATE, FERRITIN, TIBC, IRON, RETICCTPCT in the last 72 hours. Urinalysis    Component Value Date/Time   COLORURINE YELLOW 04/08/2024 2211   APPEARANCEUR CLEAR 04/08/2024 2211   APPEARANCEUR Clear 09/24/2021 1537   LABSPEC 1.008 04/08/2024 2211   PHURINE 6.0 04/08/2024 2211   GLUCOSEU NEGATIVE 04/08/2024 2211   HGBUR NEGATIVE 04/08/2024 2211   BILIRUBINUR NEGATIVE  04/08/2024 2211   BILIRUBINUR Negative 09/24/2021 1537   KETONESUR NEGATIVE 04/08/2024 2211   PROTEINUR NEGATIVE 04/08/2024  2211   UROBILINOGEN 0.2 02/20/2013 2236   NITRITE NEGATIVE 04/08/2024 2211   LEUKOCYTESUR NEGATIVE 04/08/2024 2211   Sepsis Labs Recent Labs  Lab 04/08/24 1951 04/09/24 0434  WBC 6.4 10.3   Microbiology No results found for this or any previous visit (from the past 240 hours).  Time coordinating discharge: 40 mins   SIGNED:  Afton Louder, MD  [facility] 04/10/2024, 10:51 AM How to contact the [facility] Attending or Consulting provider 7A - 7P or covering provider during after hours 7P -7A, for this patient?  Check the care team in [facility] and look for a) attending/consulting [facility] provider listed and b) the [facility] team listed Log into [email or web address] and use [facility] universal password to access. If you do not have the password, please contact the hospital operator. Locate the [facility] provider you are looking for under [facility] and page to a number that you can be directly reached. If you still have difficulty reaching the provider, please page the [facility] (Director on Call) for the Hospitalists listed on amion for assistance.

## 2024-04-10 NOTE — TOC Transition Note (Signed)
 Transition of Care [facility]) - Discharge Note   Patient Details  Name: Miguel Spencer MRN: [id number] Date of Birth: 1950/11/12  Transition of Care [facility]) CM/SW Contact:  Nena LITTIE Coffee, RN Phone Number: 04/10/2024, 10:30 AM  Clinical Narrative:     Spoke c/pt about AA and [facility] this morning at the bedside. Pt states he has tried rehab before and is not interested. I explained [facility] and their services and pt states he will look into that. He states he is definitely not interested in [place]. [facility] material given to pt and AVS updated. Pt will dc home c/his wife.    Final next level of care: Home/Self Care Barriers to Discharge: Barriers Resolved   Patient Goals and CMS Choice     Discharge Placement        Discharge Plan and Services Additional resources added to the After Visit Summary for     Social Drivers of Health (SDOH) Interventions SDOH Screenings   Food Insecurity: No Food Insecurity (04/08/2024)  Housing: Low Risk  (04/08/2024)  Transportation Needs: No Transportation Needs (04/08/2024)  Utilities: Not At Risk (04/08/2024)  Social Connections: Socially Integrated (04/08/2024)  Tobacco Use: Low Risk  (11/12/2023)   Readmission Risk Interventions     No data to display

## 2024-04-10 NOTE — Plan of Care (Signed)
  Problem: Education: Goal: Knowledge of General Education information will improve Description: Including pain rating scale, medication(s)/side effects and non-pharmacologic comfort measures 04/10/2024 1107 by Mathews Norleen POUR, RN Outcome: Adequate for Discharge 04/10/2024 0906 by Mathews Norleen POUR, RN Outcome: Progressing   Problem: Health Behavior/Discharge Planning: Goal: Ability to manage health-related needs will improve 04/10/2024 1107 by Mathews Norleen POUR, RN Outcome: Adequate for Discharge 04/10/2024 0906 by Mathews Norleen POUR, RN Outcome: Progressing   Problem: Clinical Measurements: Goal: Ability to maintain clinical measurements within normal limits will improve 04/10/2024 1107 by Mathews Norleen POUR, RN Outcome: Adequate for Discharge 04/10/2024 0906 by Mathews Norleen POUR, RN Outcome: Progressing Goal: Will remain free from infection 04/10/2024 1107 by Mathews Norleen POUR, RN Outcome: Adequate for Discharge 04/10/2024 0906 by Mathews Norleen POUR, RN Outcome: Progressing Goal: Diagnostic test results will improve 04/10/2024 1107 by Mathews Norleen POUR, RN Outcome: Adequate for Discharge 04/10/2024 0906 by Mathews Norleen POUR, RN Outcome: Progressing Goal: Respiratory complications will improve 04/10/2024 1107 by Mathews Norleen POUR, RN Outcome: Adequate for Discharge 04/10/2024 0906 by Mathews Norleen POUR, RN Outcome: Progressing Goal: Cardiovascular complication will be avoided 04/10/2024 1107 by Mathews Norleen POUR, RN Outcome: Adequate for Discharge 04/10/2024 0906 by Mathews Norleen POUR, RN Outcome: Progressing   Problem: Activity: Goal: Risk for activity intolerance will decrease 04/10/2024 1107 by Mathews Norleen POUR, RN Outcome: Adequate for Discharge 04/10/2024 0906 by Mathews Norleen POUR, RN Outcome: Progressing   Problem: Nutrition: Goal: Adequate nutrition will be maintained 04/10/2024 1107 by Mathews Norleen POUR, RN Outcome: Adequate for Discharge 04/10/2024 0906 by Mathews Norleen POUR, RN Outcome: Progressing   Problem: Coping: Goal: Level of anxiety will  decrease 04/10/2024 1107 by Mathews Norleen POUR, RN Outcome: Adequate for Discharge 04/10/2024 0906 by Mathews Norleen POUR, RN Outcome: Progressing   Problem: Elimination: Goal: Will not experience complications related to bowel motility 04/10/2024 1107 by Mathews Norleen POUR, RN Outcome: Adequate for Discharge 04/10/2024 0906 by Mathews Norleen POUR, RN Outcome: Progressing Goal: Will not experience complications related to urinary retention 04/10/2024 1107 by Mathews Norleen POUR, RN Outcome: Adequate for Discharge 04/10/2024 0906 by Mathews Norleen POUR, RN Outcome: Progressing   Problem: Pain Managment: Goal: General experience of comfort will improve and/or be controlled 04/10/2024 1107 by Mathews Norleen POUR, RN Outcome: Adequate for Discharge 04/10/2024 0906 by Mathews Norleen POUR, RN Outcome: Progressing   Problem: Safety: Goal: Ability to remain free from injury will improve 04/10/2024 1107 by Mathews Norleen POUR, RN Outcome: Adequate for Discharge 04/10/2024 0906 by Mathews Norleen POUR, RN Outcome: Progressing   Problem: Skin Integrity: Goal: Risk for impaired skin integrity will decrease 04/10/2024 1107 by Mathews Norleen POUR, RN Outcome: Adequate for Discharge 04/10/2024 0906 by Mathews Norleen POUR, RN Outcome: Progressing

## 2024-05-05 ENCOUNTER — Ambulatory Visit: Attending: Cardiology | Admitting: Cardiology

## 2024-05-05 ENCOUNTER — Encounter: Payer: Self-pay | Admitting: Cardiology

## 2024-05-05 VITALS — BP 130/84 | HR 87 | Ht 71.0 in | Wt 227.4 lb

## 2024-05-05 DIAGNOSIS — D6869 Other thrombophilia: Secondary | ICD-10-CM | POA: Diagnosis not present

## 2024-05-05 DIAGNOSIS — I10 Essential (primary) hypertension: Secondary | ICD-10-CM | POA: Diagnosis not present

## 2024-05-05 DIAGNOSIS — I480 Paroxysmal atrial fibrillation: Secondary | ICD-10-CM

## 2024-05-05 DIAGNOSIS — R55 Syncope and collapse: Secondary | ICD-10-CM

## 2024-05-05 NOTE — Patient Instructions (Signed)
 Medication Instructions:  Continue all current medications.   Labwork: none  Testing/Procedures: none  Follow-Up: 6 months   Any Other Special Instructions Will Be Listed Below (If Applicable).   If you need a refill on your cardiac medications before your next appointment, please call your pharmacy.

## 2024-05-05 NOTE — Progress Notes (Signed)
 Clinical Summary [name] is a 73 y.o.male seen today for follow up of the following medical problems.    1. PAF - admit with afib 08/2018 after not being able to take meds at home after being arrested - started on dilt gtt for afib with RVR. Restarted on his home toprol  and dilt   - previously followed in [place] , diagnosed with afib around 2017 - Dr Meliton [facility], [place] . - issues with insurance with eliquis  and xarelto. Had been on coumadin but stopped at time of his GI bleed  09/2018 Holter: PACs, rare runs of atach up to 3 beats. No symptoms reported     -denies any palpitations - no bleeding on eliquis      2. EtOH abuse/Cirrhosis - followed by GI     3. History of GI bleeding - admission to [facility] in [place], [place]  with hematemsis and rectal bleeding 07/2018 while on coumadin - EGD and colonopscopy showed gastritis, polyp  - admitted 02/2019 with rectal bleeding while on eliquis  and indomethacin  for gout - 02/2019 colonoscopy concerning for ischemic colitis however CTA was benign. EGD diffuse mild inflammation, duodenal ulcers without bleeding. was to resume eliquis  03/16/19   - no recent issues.    4. Mitral regurgitation - mild to mod by 2019 echo -04/2022 echo: LVEF 55-60%, no WMAs, mild MR   5. HTN - side effects on chlorthaldone, tried for 3 months. Causes some dizziness, felt out of sorts - compliant with meds   - compliant with meds   6. Erectile dysfunction - not responding to cialis or viagra .  - he is seen by a urologist.  - uncler if could be a medication side effect, perhaps beta blocker   7.Syncope -ER visit 04/2023 -  benign labs, EKG rate controlled afib. He turned down CT head - was coming back from lunch. Went to get out of car, after standing fell to ground. EMS was called and brought to hospital/ +EtOH level - water 4-5 bottles a day in general   - admit 04/2024 with intractable N/V and syncope -  hypotensive in ER 77/65. K 2.6, Na 127, Lactic acid 2.9.  - reported ongoing EtoH use during admission - thought secondary to dehydration, EtoH intoxication.     - no recent symptoms.    Past Medical History:  Diagnosis Date   A-fib (HCC)    Alcohol abuse    Alcoholic (HCC)    Anxiety    Colon polyp    Depression    GERD (gastroesophageal reflux disease)    GI bleed    Gout    Hypertension    Vertigo      No Known Allergies   Current Outpatient Medications  Medication Sig Dispense Refill   apixaban  (ELIQUIS ) 5 MG TABS tablet Take 1 tablet (5 mg total) by mouth 2 (two) times daily. 14 tablet 0   diltiazem  (CARDIZEM  CD) 180 MG 24 hr capsule Take 180 mg by mouth daily.     folic acid  (FOLVITE ) 1 MG tablet Take 1 tablet (1 mg total) by mouth daily. 30 tablet 1   LORazepam  (ATIVAN ) 1 MG tablet Take 1 mg by mouth daily.     Multiple Vitamin (MULTIVITAMIN) tablet Take 1 tablet by mouth daily.     nebivolol  (BYSTOLIC ) 5 MG tablet Take 5 mg by mouth daily.     pantoprazole  (PROTONIX ) 40 MG tablet Take 40 mg by mouth daily.     sildenafil  (VIAGRA )  100 MG tablet Take 1 tablet (100 mg total) by mouth as needed for erectile dysfunction. (Patient taking differently: Take 100 mg by mouth daily as needed for erectile dysfunction.) 20 tablet 5   thiamine  (VITAMIN B-1) 100 MG tablet Take 1 tablet (100 mg total) by mouth daily. 30 tablet 1   valACYclovir (VALTREX) 1000 MG tablet Take 1,000 mg by mouth daily as needed (fever blisters).     No current facility-administered medications for this visit.     Past Surgical History:  Procedure Laterality Date   ADENOIDECTOMY     BIOPSY  02/26/2019   Procedure: BIOPSY;  Surgeon: Harvey Margo CROME, MD;  Location: [facility];  Service: Endoscopy;;  Gastric    BIOPSY  03/05/2019   Procedure: BIOPSY;  Surgeon: Shaaron Lamar HERO, MD;  Location: [facility];  Service: Endoscopy;;  ascending colon bx   COLONOSCOPY  07/2018   [place]  piecemeal  fashion status post tattooing, 30 x 18 mm sized sigmoid colon polyp removed via snare status post tattooing.  Recommended repeat colonoscopy in 3 months.  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   COLONOSCOPY WITH PROPOFOL  N/A 03/05/2019   Procedure: COLONOSCOPY WITH PROPOFOL ;  Surgeon: Shaaron Lamar HERO, MD;  Location: [facility];  Service: Endoscopy;  Laterality: N/A;   ESOPHAGOGASTRODUODENOSCOPY (EGD) WITH PROPOFOL  N/A 02/26/2019   Dr. harvey: LA grade D esophagitis, gastritis with benign biopsies, no H. pylori, 5 nonbleeding superficial duodenal ulcers.  Felt to be NSAID related.   HERNIA REPAIR     inguinal-not sure which side   POLYPECTOMY  03/05/2019   Procedure: POLYPECTOMY;  Surgeon: Shaaron Lamar HERO, MD;  [facility]: [facility];  Service: Endoscopy;;  cold snare polypectomy   TONSILLECTOMY     UMBILICAL HERNIA REPAIR N/A 07/06/2020   Procedure: UMBILICAL HERNIA REPAIR WITH MESH;  Surgeon: Kallie Manuelita BROCKS, MD;  Location: [facility];  Service: General;  Laterality: N/A;     No Known Allergies    Family History  Problem Relation Age of Onset   Cancer Mother    Heart disease Father    Heart disease Brother    Heart disease Other    Colon cancer Neg Hx    Gastric cancer Neg Hx    Esophageal cancer Neg Hx      Social History Mr. Hoeffner reports that he has never smoked. He has never used smokeless tobacco. Mr. Duecker reports that he does not currently use alcohol after a past usage of about 70.0 standard drinks of alcohol per week.     Physical Examination Today's Vitals   05/05/24 1522  BP: 130/84  Pulse: 87  SpO2: 98%  Weight: 227 lb 6.4 oz (103.1 kg)  Height: 5' 11 (1.803 m)   Body mass index is 31.72 kg/m.  Gen: resting comfortably, no acute distress HEENT: no scleral icterus, pupils equal round and reactive, no palptable cervical adenopathy,   CV: irreg, 2/6 systolic murmur apex, no jvd Resp: Clear to auscultation bilaterally GI: abdomen is soft, non-tender, non-distended, normal bowel sounds, no hepatosplenomegaly MSK: extremities are warm, no edema.  Skin: warm, no rash Neuro:  no focal deficits Psych: appropriate affect   Diagnostic Studies  08/2018 echo Study Conclusions   - Left ventricle: The cavity size was normal. Wall thickness was   increased in a pattern of mild LVH. Systolic function was normal.   The estimated ejection  fraction was 50%. Diffuse hypokinesis. The   study was not technically sufficient to allow evaluation of LV   diastolic dysfunction due to atrial fibrillation. - Aortic valve: Mildly calcified annulus. Trileaflet; mildly   calcified leaflets. - Aortic root: The aortic root was mildly dilated. - Mitral valve: Mildly calcified annulus. There was mild to   moderate regurgitation. - Left atrium: The atrium was moderately dilated. - Right atrium: Central venous pressure (est): 15 mm Hg. - Atrial septum: No defect or patent foramen ovale was identified. - Tricuspid valve: There was mild regurgitation. - Pulmonary arteries: PA peak pressure: 46 mm Hg (S). - Pericardium, extracardiac: A trivial pericardial effusion was   identified posterior to the heart.     09/2018 Holter: PACs, rare runs of atach up to 3 beats. No symptoms   04/2022 echo 1. Left ventricular ejection fraction, by estimation, is 55 to 60%. The  left ventricle has normal function. The left ventricle has no regional  wall motion abnormalities. There is mild left ventricular hypertrophy.  Left ventricular diastolic parameters  are indeterminate.   2. Right ventricular systolic function is normal. The right ventricular  size is normal. There is normal pulmonary artery systolic pressure.   3. Left atrial size was severely dilated.   4. Right atrial size was mildly dilated.   5. The mitral valve is normal in structure. Mild  mitral valve  regurgitation. No evidence of mitral stenosis.   6. The tricuspid valve is abnormal.   7. The aortic valve has an indeterminant number of cusps. There is mild  calcification of the aortic valve. There is mild thickening of the aortic  valve. Aortic valve regurgitation is not visualized. very mild aortic  stenosis. Aortic valve mean gradient  measures 9.0 mmHg. Aortic valve peak gradient measures 16.0 mmHg. Aortic  valve area, by VTI measures 1.76 cm.   8. Aortic dilatation noted. There is mild dilatation of the aortic root,  measuring 40 mm.   9. The inferior vena cava is normal in size with greater than 50%  respiratory variability, suggesting right atrial pressure of 3 mmHg.      Assessment and Plan   1. PAF/acquired thrombophilia - denies any recent symptoms, continue current meds including eliquis  for stroke prevention   2. Mitral regurgitation -mild MR by prior echo, repeat study summer 2026.   3. HTN -side effects on cholrthalidone, he stopped taking - bp's look fine on bystolic  and diltiazem  which he is also on for afib - we will continue current meds   4.Syncope - history of orthostatic and hypovolemic syncope in setting of EtoH use, with a recurrent episode during admission earlier this month - discussed regular hydration, he is committed to alcohol cessation.          Dorn PHEBE Ross, M.D.

## 2024-05-17 DIAGNOSIS — D225 Melanocytic nevi of trunk: Secondary | ICD-10-CM | POA: Diagnosis not present

## 2024-05-17 DIAGNOSIS — Z08 Encounter for follow-up examination after completed treatment for malignant neoplasm: Secondary | ICD-10-CM | POA: Diagnosis not present

## 2024-05-17 DIAGNOSIS — L814 Other melanin hyperpigmentation: Secondary | ICD-10-CM | POA: Diagnosis not present

## 2024-05-17 DIAGNOSIS — L821 Other seborrheic keratosis: Secondary | ICD-10-CM | POA: Diagnosis not present

## 2024-05-17 DIAGNOSIS — L570 Actinic keratosis: Secondary | ICD-10-CM | POA: Diagnosis not present

## 2024-05-17 DIAGNOSIS — Z85828 Personal history of other malignant neoplasm of skin: Secondary | ICD-10-CM | POA: Diagnosis not present

## 2024-06-24 ENCOUNTER — Encounter: Payer: Self-pay | Admitting: Gastroenterology

## 2024-08-31 DIAGNOSIS — R7303 Prediabetes: Secondary | ICD-10-CM | POA: Diagnosis not present

## 2024-08-31 DIAGNOSIS — E782 Mixed hyperlipidemia: Secondary | ICD-10-CM | POA: Diagnosis not present

## 2024-08-31 DIAGNOSIS — Z125 Encounter for screening for malignant neoplasm of prostate: Secondary | ICD-10-CM | POA: Diagnosis not present

## 2024-09-06 DIAGNOSIS — K7460 Unspecified cirrhosis of liver: Secondary | ICD-10-CM | POA: Diagnosis not present

## 2024-09-06 DIAGNOSIS — Z0000 Encounter for general adult medical examination without abnormal findings: Secondary | ICD-10-CM | POA: Diagnosis not present

## 2024-09-06 DIAGNOSIS — M791 Myalgia, unspecified site: Secondary | ICD-10-CM | POA: Diagnosis not present

## 2024-09-06 DIAGNOSIS — Z0001 Encounter for general adult medical examination with abnormal findings: Secondary | ICD-10-CM | POA: Diagnosis not present

## 2024-09-06 DIAGNOSIS — N289 Disorder of kidney and ureter, unspecified: Secondary | ICD-10-CM | POA: Diagnosis not present

## 2024-09-06 DIAGNOSIS — M7610 Psoas tendinitis, unspecified hip: Secondary | ICD-10-CM | POA: Diagnosis not present

## 2024-09-06 DIAGNOSIS — E782 Mixed hyperlipidemia: Secondary | ICD-10-CM | POA: Diagnosis not present

## 2024-09-06 DIAGNOSIS — Z23 Encounter for immunization: Secondary | ICD-10-CM | POA: Diagnosis not present

## 2024-09-06 DIAGNOSIS — F10988 Alcohol use, unspecified with other alcohol-induced disorder: Secondary | ICD-10-CM | POA: Diagnosis not present

## 2024-09-19 ENCOUNTER — Telehealth: Payer: Self-pay | Admitting: Gastroenterology

## 2024-09-19 NOTE — Telephone Encounter (Signed)
 Lab reviewed from PCP.  CBC and CMP unremarkable. LFTs normal.   He needs a non urgent follow up with Dr cindie or any APP as he has only seen [facility] previously. Can be January or February pending the schedule.

## 2024-09-22 NOTE — Telephone Encounter (Signed)
 LMOM for pt to call office

## 2024-09-28 NOTE — Telephone Encounter (Signed)
 LMOM for pt to call office

## 2024-10-14 ENCOUNTER — Ambulatory Visit: Admitting: Internal Medicine

## 2024-10-26 ENCOUNTER — Encounter: Payer: Self-pay | Admitting: *Deleted

## 2024-10-27 NOTE — Telephone Encounter (Signed)
Tried several times to reach pt. Mailed a letter.  

## 2025-01-25 ENCOUNTER — Ambulatory Visit: Admitting: Cardiology
